# Patient Record
Sex: Female | Born: 1965
Health system: Southern US, Community
[De-identification: ages and names within clinical notes are randomized; demographics above are authoritative.]

## PROBLEM LIST (undated history)

## (undated) DIAGNOSIS — Z9889 Other specified postprocedural states: Secondary | ICD-10-CM

## (undated) DIAGNOSIS — F419 Anxiety disorder, unspecified: Secondary | ICD-10-CM

## (undated) DIAGNOSIS — D120 Benign neoplasm of cecum: Secondary | ICD-10-CM

## (undated) DIAGNOSIS — R1013 Epigastric pain: Secondary | ICD-10-CM

## (undated) DIAGNOSIS — K635 Polyp of colon: Secondary | ICD-10-CM

## (undated) DIAGNOSIS — K317 Polyp of stomach and duodenum: Secondary | ICD-10-CM

## (undated) DIAGNOSIS — F329 Major depressive disorder, single episode, unspecified: Secondary | ICD-10-CM

## (undated) DIAGNOSIS — M7730 Calcaneal spur, unspecified foot: Secondary | ICD-10-CM

## (undated) DIAGNOSIS — E079 Disorder of thyroid, unspecified: Secondary | ICD-10-CM

## (undated) DIAGNOSIS — Z5189 Encounter for other specified aftercare: Secondary | ICD-10-CM

## (undated) DIAGNOSIS — R112 Nausea with vomiting, unspecified: Secondary | ICD-10-CM

## (undated) DIAGNOSIS — J338 Other polyp of sinus: Secondary | ICD-10-CM

## (undated) DIAGNOSIS — E785 Hyperlipidemia, unspecified: Secondary | ICD-10-CM

## (undated) DIAGNOSIS — E063 Autoimmune thyroiditis: Secondary | ICD-10-CM

## (undated) DIAGNOSIS — IMO0002 Reserved for concepts with insufficient information to code with codable children: Secondary | ICD-10-CM

## (undated) DIAGNOSIS — M722 Plantar fascial fibromatosis: Secondary | ICD-10-CM

## (undated) DIAGNOSIS — Z972 Presence of dental prosthetic device (complete) (partial): Secondary | ICD-10-CM

## (undated) DIAGNOSIS — M7661 Achilles tendinitis, right leg: Secondary | ICD-10-CM

## (undated) DIAGNOSIS — J349 Unspecified disorder of nose and nasal sinuses: Secondary | ICD-10-CM

## (undated) DIAGNOSIS — D649 Anemia, unspecified: Secondary | ICD-10-CM

## (undated) DIAGNOSIS — K219 Gastro-esophageal reflux disease without esophagitis: Secondary | ICD-10-CM

## (undated) DIAGNOSIS — M1990 Unspecified osteoarthritis, unspecified site: Secondary | ICD-10-CM

## (undated) DIAGNOSIS — T7840XA Allergy, unspecified, initial encounter: Secondary | ICD-10-CM

## (undated) HISTORY — DX: Epigastric pain: R10.13

## (undated) HISTORY — PX: ABDOMINAL HYSTERECTOMY: SHX81

## (undated) HISTORY — DX: Major depressive disorder, single episode, unspecified: F32.9

## (undated) HISTORY — DX: Plantar fascial fibromatosis: M72.2

## (undated) HISTORY — DX: Disorder of thyroid, unspecified: E07.9

## (undated) HISTORY — DX: Other polyp of sinus: J33.8

## (undated) HISTORY — DX: Polyp of stomach and duodenum: K31.7

## (undated) HISTORY — DX: Unspecified osteoarthritis, unspecified site: M19.90

## (undated) HISTORY — DX: Reserved for concepts with insufficient information to code with codable children: IMO0002

## (undated) HISTORY — DX: Anxiety disorder, unspecified: F41.9

## (undated) HISTORY — DX: Anemia, unspecified: D64.9

## (undated) HISTORY — DX: Hyperlipidemia, unspecified: E78.5

## (undated) HISTORY — PX: OVARIAN CYST REMOVAL: SHX89

## (undated) HISTORY — DX: Polyp of colon: K63.5

## (undated) HISTORY — DX: Benign neoplasm of cecum: D12.0

## (undated) HISTORY — DX: Achilles tendinitis, right leg: M76.61

## (undated) HISTORY — DX: Encounter for other specified aftercare: Z51.89

## (undated) HISTORY — DX: Calcaneal spur, unspecified foot: M77.30

## (undated) HISTORY — DX: Gastro-esophageal reflux disease without esophagitis: K21.9

## (undated) HISTORY — DX: Allergy, unspecified, initial encounter: T78.40XA

## (undated) HISTORY — PX: TOTAL ABDOMINAL HYSTERECTOMY: SHX209

## (undated) HISTORY — DX: Unspecified disorder of nose and nasal sinuses: J34.9

---

## 2013-05-17 DIAGNOSIS — E039 Hypothyroidism, unspecified: Secondary | ICD-10-CM | POA: Insufficient documentation

## 2013-05-17 DIAGNOSIS — M1990 Unspecified osteoarthritis, unspecified site: Secondary | ICD-10-CM | POA: Insufficient documentation

## 2013-05-17 DIAGNOSIS — K219 Gastro-esophageal reflux disease without esophagitis: Secondary | ICD-10-CM | POA: Insufficient documentation

## 2018-06-11 ENCOUNTER — Ambulatory Visit (INDEPENDENT_AMBULATORY_CARE_PROVIDER_SITE_OTHER): Payer: Self-pay | Admitting: Medical

## 2018-06-11 ENCOUNTER — Encounter: Payer: Self-pay | Admitting: Medical

## 2018-06-11 VITALS — BP 122/80 | HR 80 | Temp 98.7°F | Wt 202.0 lb

## 2018-06-11 DIAGNOSIS — R062 Wheezing: Secondary | ICD-10-CM

## 2018-06-11 DIAGNOSIS — R059 Cough, unspecified: Secondary | ICD-10-CM

## 2018-06-11 DIAGNOSIS — F17210 Nicotine dependence, cigarettes, uncomplicated: Secondary | ICD-10-CM

## 2018-06-11 DIAGNOSIS — J40 Bronchitis, not specified as acute or chronic: Secondary | ICD-10-CM

## 2018-06-11 DIAGNOSIS — R05 Cough: Secondary | ICD-10-CM

## 2018-06-11 MED ORDER — AMOXICILLIN-POT CLAVULANATE 875-125 MG PO TABS: 1.0000 | ORAL_TABLET | Freq: Two times a day (BID) | ORAL | 0 refills | Status: DC

## 2018-06-11 MED ORDER — PREDNISONE 10 MG (21) PO TBPK: ORAL_TABLET | ORAL | 0 refills | Status: DC

## 2018-06-11 MED ORDER — ALBUTEROL SULFATE HFA 108 (90 BASE) MCG/ACT IN AERS: 2.0000 | INHALATION_SPRAY | Freq: Four times a day (QID) | RESPIRATORY_TRACT | 0 refills | Status: DC | PRN

## 2018-06-11 MED ORDER — BENZONATATE 100 MG PO CAPS: 100.0000 mg | ORAL_CAPSULE | Freq: Two times a day (BID) | ORAL | 0 refills | Status: DC | PRN

## 2018-06-11 MED ORDER — ALBUTEROL SULFATE (2.5 MG/3ML) 0.083% IN NEBU
2.5000 mg | INHALATION_SOLUTION | Freq: Once | RESPIRATORY_TRACT | Status: AC
  Administered 2018-06-11: 2.5 mg via RESPIRATORY_TRACT

## 2018-06-11 MED ORDER — IPRATROPIUM-ALBUTEROL 0.5-2.5 (3) MG/3ML IN SOLN: 3.0000 mL | Freq: Once | RESPIRATORY_TRACT | Status: DC

## 2018-06-11 NOTE — Patient Instructions (Addendum)
Prednisone tablets What is this medicine? PREDNISONE (PRED ni sone) is a corticosteroid. It is commonly used to treat inflammation of the skin, joints, lungs, and other organs. Common conditions treated include asthma, allergies, and arthritis. It is also used for other conditions, such as blood disorders and diseases of the adrenal glands. This medicine may be used for other purposes; ask your health care provider or pharmacist if you have questions. COMMON BRAND NAME(S): Deltasone, Predone, Sterapred, Sterapred DS What should I tell my health care provider before I take this medicine? They need to know if you have any of these conditions: -Cushing's syndrome -diabetes -glaucoma -heart disease -high blood pressure -infection (especially a virus infection such as chickenpox, cold sores, or herpes) -kidney disease -liver disease -mental illness -myasthenia gravis -osteoporosis -seizures -stomach or intestine problems -thyroid disease -an unusual or allergic reaction to lactose, prednisone, other medicines, foods, dyes, or preservatives -pregnant or trying to get pregnant -breast-feeding How should I use this medicine? Take this medicine by mouth with a glass of water. Follow the directions on the prescription label. Take this medicine with food. If you are taking this medicine once a day, take it in the morning. Do not take more medicine than you are told to take. Do not suddenly stop taking your medicine because you may develop a severe reaction. Your doctor will tell you how much medicine to take. If your doctor wants you to stop the medicine, the dose may be slowly lowered over time to avoid any side effects. Talk to your pediatrician regarding the use of this medicine in children. Special care may be needed. Overdosage: If you think you have taken too much of this medicine contact a poison control center or emergency room at once. NOTE: This medicine is only for you. Do not share this  medicine with others. What if I miss a dose? If you miss a dose, take it as soon as you can. If it is almost time for your next dose, talk to your doctor or health care professional. You may need to miss a dose or take an extra dose. Do not take double or extra doses without advice. What may interact with this medicine? Do not take this medicine with any of the following medications: -metyrapone -mifepristone This medicine may also interact with the following medications: -aminoglutethimide -amphotericin B -aspirin and aspirin-like medicines -barbiturates -certain medicines for diabetes, like glipizide or glyburide -cholestyramine -cholinesterase inhibitors -cyclosporine -digoxin -diuretics -ephedrine -female hormones, like estrogens and birth control pills -isoniazid -ketoconazole -NSAIDS, medicines for pain and inflammation, like ibuprofen or naproxen -phenytoin -rifampin -toxoids -vaccines -warfarin This list may not describe all possible interactions. Give your health care provider a list of all the medicines, herbs, non-prescription drugs, or dietary supplements you use. Also tell them if you smoke, drink alcohol, or use illegal drugs. Some items may interact with your medicine. What should I watch for while using this medicine? Visit your doctor or health care professional for regular checks on your progress. If you are taking this medicine over a prolonged period, carry an identification card with your name and address, the type and dose of your medicine, and your doctor's name and address. This medicine may increase your risk of getting an infection. Tell your doctor or health care professional if you are around anyone with measles or chickenpox, or if you develop sores or blisters that do not heal properly. If you are going to have surgery, tell your doctor or health care professional that  you have taken this medicine within the last twelve months. Ask your doctor or health  care professional about your diet. You may need to lower the amount of salt you eat. This medicine may affect blood sugar levels. If you have diabetes, check with your doctor or health care professional before you change your diet or the dose of your diabetic medicine. What side effects may I notice from receiving this medicine? Side effects that you should report to your doctor or health care professional as soon as possible: -allergic reactions like skin rash, itching or hives, swelling of the face, lips, or tongue -changes in emotions or moods -changes in vision -depressed mood -eye pain -fever or chills, cough, sore throat, pain or difficulty passing urine -increased thirst -swelling of ankles, feet Side effects that usually do not require medical attention (report to your doctor or health care professional if they continue or are bothersome): -confusion, excitement, restlessness -headache -nausea, vomiting -skin problems, acne, thin and shiny skin -trouble sleeping -weight gain This list may not describe all possible side effects. Call your doctor for medical advice about side effects. You may report side effects to FDA at 1-800-FDA-1088. Where should I keep my medicine? Keep out of the reach of children. Store at room temperature between 15 and 30 degrees C (59 and 86 degrees F). Protect from light. Keep container tightly closed. Throw away any unused medicine after the expiration date. NOTE: This sheet is a summary. It may not cover all possible information. If you have questions about this medicine, talk to your doctor, pharmacist, or health care provider.  2018 Elsevier/Gold Standard (2011-05-01 10:57:14) Albuterol inhalation aerosol What is this medicine? ALBUTEROL (al Normajean [place]) is a bronchodilator. It helps open up the airways in your lungs to make it easier to breathe. This medicine is used to treat and to prevent bronchospasm. This medicine may be used for other  purposes; ask your health care provider or pharmacist if you have questions. COMMON BRAND NAME(S): Proair HFA, Proventil, Proventil HFA, Respirol, Ventolin, Ventolin HFA What should I tell my health care provider before I take this medicine? They need to know if you have any of the following conditions: -diabetes -heart disease or irregular heartbeat -high blood pressure -pheochromocytoma -seizures -thyroid disease -an unusual or allergic reaction to albuterol, levalbuterol, sulfites, other medicines, foods, dyes, or preservatives -pregnant or trying to get pregnant -breast-feeding How should I use this medicine? This medicine is for inhalation through the mouth. Follow the directions on your prescription label. Take your medicine at regular intervals. Do not use more often than directed. Make sure that you are using your inhaler correctly. Ask you doctor or health care provider if you have any questions. Talk to your pediatrician regarding the use of this medicine in children. Special care may be needed. Overdosage: If you think you have taken too much of this medicine contact a poison control center or emergency room at once. NOTE: This medicine is only for you. Do not share this medicine with others. What if I miss a dose? If you miss a dose, use it as soon as you can. If it is almost time for your next dose, use only that dose. Do not use double or extra doses. What may interact with this medicine? -anti-infectives like chloroquine and pentamidine -caffeine -cisapride -diuretics -medicines for colds -medicines for depression or for emotional or psychotic conditions -medicines for weight loss including some herbal products -methadone -some antibiotics like clarithromycin, erythromycin, levofloxacin, and  linezolid -some heart medicines -steroid hormones like dexamethasone, cortisone, hydrocortisone -theophylline -thyroid hormones This list may not describe all possible  interactions. Give your health care provider a list of all the medicines, herbs, non-prescription drugs, or dietary supplements you use. Also tell them if you smoke, drink alcohol, or use illegal drugs. Some items may interact with your medicine. What should I watch for while using this medicine? Tell your doctor or health care professional if your symptoms do not improve. Do not use extra albuterol. If your asthma or bronchitis gets worse while you are using this medicine, call your doctor right away. If your mouth gets dry try chewing sugarless gum or sucking hard candy. Drink water as directed. What side effects may I notice from receiving this medicine? Side effects that you should report to your doctor or health care professional as soon as possible: -allergic reactions like skin rash, itching or hives, swelling of the face, lips, or tongue -breathing problems -chest pain -feeling faint or lightheaded, falls -high blood pressure -irregular heartbeat -fever -muscle cramps or weakness -pain, tingling, numbness in the hands or feet -vomiting Side effects that usually do not require medical attention (report to your doctor or health care professional if they continue or are bothersome): -cough -difficulty sleeping -headache -nervousness or trembling -stomach upset -stuffy or runny nose -throat irritation -unusual taste This list may not describe all possible side effects. Call your doctor for medical advice about side effects. You may report side effects to FDA at 1-800-FDA-1088. Where should I keep my medicine? Keep out of the reach of children. Store at room temperature between 15 and 30 degrees C (59 and 86 degrees F). The contents are under pressure and may burst when exposed to heat or flame. Do not freeze. This medicine does not work as well if it is too cold. Throw away any unused medicine after the expiration date. Inhalers need to be thrown away after the labeled number of puffs  have been used or by the expiration date; whichever comes first. Ventolin HFA should be thrown away 12 months after removing from foil pouch. Check the instructions that come with your medicine. NOTE: This sheet is a summary. It may not cover all possible information. If you have questions about this medicine, talk to your doctor, pharmacist, or health care provider.  2018 Elsevier/Gold Standard (2013-03-03 10:57:17) Acute Bronchitis, Adult Acute bronchitis is when air tubes (bronchi) in the lungs suddenly get swollen. The condition can make it hard to breathe. It can also cause these symptoms:  A cough.  Coughing up clear, yellow, or green mucus.  Wheezing.  Chest congestion.  Shortness of breath.  A fever.  Body aches.  Chills.  A sore throat.  Follow these instructions at home: Medicines  Take over-the-counter and prescription medicines only as told by your doctor.  If you were prescribed an antibiotic medicine, take it as told by your doctor. Do not stop taking the antibiotic even if you start to feel better. General instructions  Rest.  Drink enough fluids to keep your pee (urine) clear or pale yellow.  Avoid smoking and secondhand smoke. If you smoke and you need help quitting, ask your doctor. Quitting will help your lungs heal faster.  Use an inhaler, cool mist vaporizer, or humidifier as told by your doctor.  Keep all follow-up visits as told by your doctor. This is important. How is this prevented? To lower your risk of getting this condition again:  Wash your hands often  with soap and water. If you cannot use soap and water, use hand sanitizer.  Avoid contact with people who have cold symptoms.  Try not to touch your hands to your mouth, nose, or eyes.  Make sure to get the flu shot every year.  Contact a doctor if:  Your symptoms do not get better in 2 weeks. Get help right away if:  You cough up blood.  You have chest pain.  You have very bad  shortness of breath.  You become dehydrated.  You faint (pass out) or keep feeling like you are going to pass out.  You keep throwing up (vomiting).  You have a very bad headache.  Your fever or chills gets worse. This information is not intended to replace advice given to you by your health care provider. Make sure you discuss any questions you have with your health care provider. Document Released: 03/03/2008 Document Revised: 04/23/2016 Document Reviewed: 03/05/2016 Elsevier Interactive Patient Education  2018 [employer]. . Albuterol inhalation aerosol What is this medicine? ALBUTEROL (al Normajean [place]) is a bronchodilator. It helps open up the airways in your lungs to make it easier to breathe. This medicine is used to treat and to prevent bronchospasm. This medicine may be used for other purposes; ask your health care provider or pharmacist if you have questions. COMMON BRAND NAME(S): Proair HFA, Proventil, Proventil HFA, Respirol, Ventolin, Ventolin HFA What should I tell my health care provider before I take this medicine? They need to know if you have any of the following conditions: -diabetes -heart disease or irregular heartbeat -high blood pressure -pheochromocytoma -seizures -thyroid disease -an unusual or allergic reaction to albuterol, levalbuterol, sulfites, other medicines, foods, dyes, or preservatives -pregnant or trying to get pregnant -breast-feeding How should I use this medicine? This medicine is for inhalation through the mouth. Follow the directions on your prescription label. Take your medicine at regular intervals. Do not use more often than directed. Make sure that you are using your inhaler correctly. Ask you doctor or health care provider if you have any questions. Talk to your pediatrician regarding the use of this medicine in children. Special care may be needed. Overdosage: If you think you have taken too much of this medicine contact a poison  control center or emergency room at once. NOTE: This medicine is only for you. Do not share this medicine with others. What if I miss a dose? If you miss a dose, use it as soon as you can. If it is almost time for your next dose, use only that dose. Do not use double or extra doses. What may interact with this medicine? -anti-infectives like chloroquine and pentamidine -caffeine -cisapride -diuretics -medicines for colds -medicines for depression or for emotional or psychotic conditions -medicines for weight loss including some herbal products -methadone -some antibiotics like clarithromycin, erythromycin, levofloxacin, and linezolid -some heart medicines -steroid hormones like dexamethasone, cortisone, hydrocortisone -theophylline -thyroid hormones This list may not describe all possible interactions. Give your health care provider a list of all the medicines, herbs, non-prescription drugs, or dietary supplements you use. Also tell them if you smoke, drink alcohol, or use illegal drugs. Some items may interact with your medicine. What should I watch for while using this medicine? Tell your doctor or health care professional if your symptoms do not improve. Do not use extra albuterol. If your asthma or bronchitis gets worse while you are using this medicine, call your doctor right away. If your mouth gets dry  try chewing sugarless gum or sucking hard candy. Drink water as directed. What side effects may I notice from receiving this medicine? Side effects that you should report to your doctor or health care professional as soon as possible: -allergic reactions like skin rash, itching or hives, swelling of the face, lips, or tongue -breathing problems -chest pain -feeling faint or lightheaded, falls -high blood pressure -irregular heartbeat -fever -muscle cramps or weakness -pain, tingling, numbness in the hands or feet -vomiting Side effects that usually do not require medical  attention (report to your doctor or health care professional if they continue or are bothersome): -cough -difficulty sleeping -headache -nervousness or trembling -stomach upset -stuffy or runny nose -throat irritation -unusual taste This list may not describe all possible side effects. Call your doctor for medical advice about side effects. You may report side effects to FDA at 1-800-FDA-1088. Where should I keep my medicine? Keep out of the reach of children. Store at room temperature between 15 and 30 degrees C (59 and 86 degrees F). The contents are under pressure and may burst when exposed to heat or flame. Do not freeze. This medicine does not work as well if it is too cold. Throw away any unused medicine after the expiration date. Inhalers need to be thrown away after the labeled number of puffs have been used or by the expiration date; whichever comes first. Ventolin HFA should be thrown away 12 months after removing from foil pouch. Check the instructions that come with your medicine. NOTE: This sheet is a summary. It may not cover all possible information. If you have questions about this medicine, talk to your doctor, pharmacist, or health care provider.  2018 Elsevier/Gold Standard (2013-03-03 10:57:17) Amoxicillin; Clavulanic Acid tablets What is this medicine? AMOXICILLIN; CLAVULANIC ACID (a mox i SIL in; KLAV yoo lan ic AS id) is a penicillin antibiotic. It is used to treat certain kinds of bacterial infections. It will not work for colds, flu, or other viral infections. This medicine may be used for other purposes; ask your health care provider or pharmacist if you have questions. COMMON BRAND NAME(S): Augmentin What should I tell my health care provider before I take this medicine? They need to know if you have any of these conditions: -bowel disease, like colitis -kidney disease -liver disease -mononucleosis -an unusual or allergic reaction to amoxicillin, penicillin,  cephalosporin, other antibiotics, clavulanic acid, other medicines, foods, dyes, or preservatives -pregnant or trying to get pregnant -breast-feeding How should I use this medicine? Take this medicine by mouth with a full glass of water. Follow the directions on the prescription label. Take at the start of a meal. Do not crush or chew. If the tablet has a score line, you may cut it in half at the score line for easier swallowing. Take your medicine at regular intervals. Do not take your medicine more often than directed. Take all of your medicine as directed even if you think you are better. Do not skip doses or stop your medicine early. Talk to your pediatrician regarding the use of this medicine in children. Special care may be needed. Overdosage: If you think you have taken too much of this medicine contact a poison control center or emergency room at once. NOTE: This medicine is only for you. Do not share this medicine with others. What if I miss a dose? If you miss a dose, take it as soon as you can. If it is almost time for your next dose, take  only that dose. Do not take double or extra doses. What may interact with this medicine? -allopurinol -anticoagulants -birth control pills -methotrexate -probenecid This list may not describe all possible interactions. Give your health care provider a list of all the medicines, herbs, non-prescription drugs, or dietary supplements you use. Also tell them if you smoke, drink alcohol, or use illegal drugs. Some items may interact with your medicine. What should I watch for while using this medicine? Tell your doctor or health care professional if your symptoms do not improve. Do not treat diarrhea with over the counter products. Contact your doctor if you have diarrhea that lasts more than 2 days or if it is severe and watery. If you have diabetes, you may get a false-positive result for sugar in your urine. Check with your doctor or health care  professional. Birth control pills may not work properly while you are taking this medicine. Talk to your doctor about using an extra method of birth control. What side effects may I notice from receiving this medicine? Side effects that you should report to your doctor or health care professional as soon as possible: -allergic reactions like skin rash, itching or hives, swelling of the face, lips, or tongue -breathing problems -dark urine -fever or chills, sore throat -redness, blistering, peeling or loosening of the skin, including inside the mouth -seizures -trouble passing urine or change in the amount of urine -unusual bleeding, bruising -unusually weak or tired -white patches or sores in the mouth or throat Side effects that usually do not require medical attention (report to your doctor or health care professional if they continue or are bothersome): -diarrhea -dizziness -headache -nausea, vomiting -stomach upset -vaginal or anal irritation This list may not describe all possible side effects. Call your doctor for medical advice about side effects. You may report side effects to FDA at 1-800-FDA-1088. Where should I keep my medicine? Keep out of the reach of children. Store at room temperature below 25 degrees C (77 degrees F). Keep container tightly closed. Throw away any unused medicine after the expiration date. NOTE: This sheet is a summary. It may not cover all possible information. If you have questions about this medicine, talk to your doctor, pharmacist, or health care provider.  2018 Elsevier/Gold Standard (2007-12-09 12:04:30)

## 2018-06-11 NOTE — Progress Notes (Signed)
Subjective:    Patient ID: Brandy Ballard, female    DOB: Dec 02, 1965, 52 y.o.   MRN: [id number]  HPI 52 yo female in non acute distress.Cough x 7 days.  Productive cough x 3 day-4 days , yellow /green. Nasal congestion and chest congestion. Taking Zyrtec , Tylenol and Sudafed. Has heard wheezing and rattling. Shortness of breath and some chest discomfort , tightness, coughing makes the chest hurt. Thought she was getting better then worse last night.  No prior history of Bronchitis or Pneumonia.  Smoker: 1ppd/ x 44yrs  Blood pressure 122/80, pulse 80, temperature 98.7 F (37.1 C), weight 202 lb (91.6 kg), SpO2 97 %.  Review of Systems  Constitutional: Positive for appetite change (decreased). Negative for chills and fever.  HENT: Positive for congestion, postnasal drip, sinus pressure and voice change (hoarse). Negative for ear pain, rhinorrhea, sinus pain and sore throat.   Eyes: Positive for discharge (yellow/white) and itching.  Respiratory: Positive for cough, chest tightness, shortness of breath and wheezing.   Cardiovascular: Positive for chest pain.  Gastrointestinal: Negative for abdominal pain.  Musculoskeletal: Negative for myalgias.  Skin: Negative for rash.  Allergic/Immunologic: Positive for environmental allergies. Negative for food allergies.  Neurological: Negative for dizziness, syncope and light-headedness.  Hematological: Negative for adenopathy.       Objective:   Physical Exam  Constitutional: She is oriented to person, place, and time. She appears well-developed and well-nourished.  HENT:  Head: Normocephalic and atraumatic.  Right Ear: Hearing, external ear and ear canal normal. A middle ear effusion is present.  Left Ear: Hearing, external ear and ear canal normal. A middle ear effusion is present.  Mouth/Throat: Uvula is midline, oropharynx is clear and moist and mucous membranes are normal.  Eyes: Pupils are equal, round, and reactive to light. Conjunctivae  and EOM are normal.  Cardiovascular: Normal rate, regular rhythm and normal heart sounds.  Pulmonary/Chest: Effort normal. She has wheezes in the right upper field, the right middle field, the right lower field, the left upper field, the left middle field and the left lower field.  Neurological: She is alert and oriented to person, place, and time.  Skin: Skin is warm and dry.  Psychiatric: She has a normal mood and affect. Her behavior is normal. Judgment and thought content normal.  Nursing note and vitals reviewed.  Coughing in room, able to speak in full sentences and able to take deep breaths   Duoneb treatment s/p 97% RA HR 76 Right side very minimal inspiratory wheeze in base Left side improved wheezing but still wheezing base and mid lungf     Assessment & Plan:  Bronchitis, cough including bronchospasm Wheezing, smoker  Meds ordered this encounter  Medications  . benzonatate (TESSALON) 100 MG capsule    Sig: Take 1 capsule (100 mg total) by mouth 2 (two) times daily as needed for cough (take 1-2 capsules every 8 hours as needed for cough).    Dispense:  30 capsule    Refill:  0  . ipratropium-albuterol (DUONEB) 0.5-2.5 (3) MG/3ML nebulizer solution 3 mL  . predniSONE (STERAPRED UNI-PAK 21 TAB) 10 MG (21) TBPK tablet    Sig: Take 6 tablets by mouth today then  5 tablets tomorrow then one less each day thereafter.    Dispense:  21 tablet    Refill:  0  . albuterol (PROVENTIL HFA;VENTOLIN HFA) 108 (90 Base) MCG/ACT inhaler    Sig: Inhale 2 puffs into the lungs every 6 (six) hours as  needed for wheezing or shortness of breath.    Dispense:  1 Inhaler    Refill:  0  . amoxicillin-clavulanate (AUGMENTIN) 875-125 MG tablet    Sig: Take 1 tablet by mouth 2 (two) times daily.    Dispense:  20 tablet    Refill:  0   Try to  Decrease smoking while sick. Take OTC Tylenol for discomfort. Follow up with an urgent care or your doctor if not improving in 3-5 days . If worsening  shortness of breath or chest pain you should be evaluated further by an urgent care , your doctor or the emergency department and most likely will need a chest x-ray.Reviewed medications with the patient. Given work note for the rest of today, then she is off till Sunday. To Rest and increase fluids. She verbalizes understanding and has no questions at discharge.

## 2018-06-14 ENCOUNTER — Telehealth: Payer: Self-pay | Admitting: Emergency Medicine

## 2018-06-14 NOTE — Telephone Encounter (Signed)
Spoke with patient on last dose of prednisone still has antibiotic, inhaler and tessalon perls stated that she is still coughing but doing much better

## 2018-06-30 ENCOUNTER — Other Ambulatory Visit: Payer: Self-pay

## 2018-06-30 ENCOUNTER — Ambulatory Visit (INDEPENDENT_AMBULATORY_CARE_PROVIDER_SITE_OTHER): Payer: No Typology Code available for payment source | Admitting: Physician Assistant

## 2018-06-30 ENCOUNTER — Encounter: Payer: Self-pay | Admitting: Physician Assistant

## 2018-06-30 VITALS — BP 124/80 | HR 88 | Temp 99.0°F | Ht 65.5 in | Wt 201.6 lb

## 2018-06-30 DIAGNOSIS — Z1211 Encounter for screening for malignant neoplasm of colon: Secondary | ICD-10-CM

## 2018-06-30 DIAGNOSIS — Z1239 Encounter for other screening for malignant neoplasm of breast: Secondary | ICD-10-CM

## 2018-06-30 DIAGNOSIS — Z13220 Encounter for screening for lipoid disorders: Secondary | ICD-10-CM | POA: Diagnosis not present

## 2018-06-30 DIAGNOSIS — Z131 Encounter for screening for diabetes mellitus: Secondary | ICD-10-CM

## 2018-06-30 DIAGNOSIS — Z114 Encounter for screening for human immunodeficiency virus [HIV]: Secondary | ICD-10-CM

## 2018-06-30 DIAGNOSIS — Z716 Tobacco abuse counseling: Secondary | ICD-10-CM

## 2018-06-30 DIAGNOSIS — E063 Autoimmune thyroiditis: Secondary | ICD-10-CM

## 2018-06-30 DIAGNOSIS — Z130 Encounter for screening for diseases of the blood and blood-forming organs and certain disorders involving the immune mechanism: Secondary | ICD-10-CM

## 2018-06-30 MED ORDER — NA SULFATE-K SULFATE-MG SULF 17.5-3.13-1.6 GM/177ML PO SOLN: 1.0000 | Freq: Once | ORAL | 0 refills | Status: AC

## 2018-06-30 MED ORDER — BUPROPION HCL ER (SR) 150 MG PO TB12: ORAL_TABLET | ORAL | 0 refills | Status: DC

## 2018-06-30 NOTE — Progress Notes (Signed)
Patient: Brandy Ballard, Female    DOB: [birth year], 52 y.o.   MRN: [id number] Visit Date: 07/02/2018  Today's Provider: Trinna Post, PA-C   Chief Complaint  Patient presents with  . Establish Care   Subjective:   New Patient:  Brandy Ballard is a 52 year old female who presents today to [place] as a new patient. Has not been followed by a PCP recently.  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 She has been married for two years. She has one child aged 54 from previous relationship.   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 She has not taken synthroid for the past 5 years, last thyroid level checked 3 years ago.   Hystrectomy: Reports she had hysterectomy in 2009 for irregular uterine bleeding requiring transfusions. She does not know if she still has a cervix, has not had a recent PAP smear. She had her gynecologic care at [facility] in [place], [place].  Peptic Ulcer: Has a history of peptic ulcers 2/2 NSAID use.   Colon Cancer Screening: Due today.   She reports upper respiratory symptoms today including coughing, congestion. Husband recently sick.   Wt Readings from Last 3 Encounters:  06/30/18 201 lb 9.6 oz (91.4 kg)  06/11/18 202 lb (91.6 kg)    -----------------------------------------------------------------  Review of Systems  Constitutional: Positive for diaphoresis and fatigue.       Irritability   HENT: Positive for congestion, hearing loss, postnasal drip, rhinorrhea, sinus pressure, sinus pain, sneezing, sore throat, tinnitus and voice change.   Eyes: Positive for discharge, redness and itching.  Respiratory: Positive for cough.   Cardiovascular: Negative.   Gastrointestinal: Positive for abdominal pain.  Endocrine: Positive for cold intolerance and heat intolerance.  Genitourinary: Negative.   Musculoskeletal:  Positive for arthralgias, back pain, myalgias and neck pain.  Skin: Positive for rash.  Allergic/Immunologic: Positive for environmental allergies.  Neurological: Positive for numbness.  Hematological: Negative.   Psychiatric/Behavioral: Positive for sleep disturbance. The patient is nervous/anxious.     Social History      She  reports that she has been smoking cigarettes. She has a 10.00 pack-year smoking history. She has never used smokeless tobacco. She reports that she drank alcohol. She reports that she does not use drugs.       Social History   Socioeconomic History  . Marital status: Married    Spouse name: Not on file  . Number of children: Not on file  . Years of education: Not on file  . Highest education level: Not on file  Occupational History  . Not on file  Social Needs  . Financial resource strain: Not on file  . Food insecurity:    Worry: Not on file    Inability: Not on file  . Transportation needs:    Medical: Not on file    Non-medical: Not on file  Tobacco Use  . Smoking status: Current Every Day Smoker    Packs/day: 0.50    Years: 20.00    Pack years: 10.00    Types: Cigarettes  . Smokeless tobacco: Never Used  Substance and Sexual Activity  . Alcohol use: Not Currently  . Drug use: Never  . Sexual activity: Not on file  Lifestyle  . Physical activity:    Days per week: Not on file  Minutes per session: Not on file  . Stress: Not on file  Relationships  . Social connections:    Talks on phone: Not on file    Gets together: Not on file    Attends religious service: Not on file    Active member of club or organization: Not on file    Attends meetings of clubs or organizations: Not on file    Relationship status: Not on file  Other Topics Concern  . Not on file  Social History Narrative  . Not on file    Past Medical History:  Diagnosis Date  . Allergy   . Anemia   . Anxiety   . Arthritis    lower back  . Blood transfusion without  reported diagnosis   . Dental bridge present    permanent lower retainer  . Depression   . GERD (gastroesophageal reflux disease)   . Hashimoto's disease   . PONV (postoperative nausea and vomiting)    nausea only  . Thyroid disease      There are no active problems to display for this patient.   Past Surgical History:  Procedure Laterality Date  . ABDOMINAL HYSTERECTOMY    . OVARIAN CYST REMOVAL      Family History        Family Status  Relation Name Status  . Mother  Alive  . Father  Alive  . PGM  Deceased  . PGF  Deceased  . Daughter  Alive  . MGM  Deceased  . MGF  Deceased        Her family history includes Atrial fibrillation in her father; Breast cancer in her paternal grandmother; CVA in her father and paternal grandfather; Heart attack in her paternal grandfather; Hypertension in her father; Meniere's disease in her mother; Polycystic ovary syndrome in her daughter.      No Known Allergies   Current Outpatient Medications:  .  acetaminophen (TYLENOL) 500 MG tablet, Take 1,000 mg by mouth every 6 (six) hours as needed., Disp: , Rfl:  .  cetirizine (ZYRTEC) 10 MG tablet, Take 10 mg by mouth daily., Disp: , Rfl:  .  Nutritional Supplements (ESTROVEN PO), Take by mouth., Disp: , Rfl:  .  atorvastatin (LIPITOR) 10 MG tablet, Take 1 tablet (10 mg total) by mouth daily., Disp: 30 tablet, Rfl: 3 .  buPROPion (WELLBUTRIN SR) 150 MG 12 hr tablet, Take one pill daily for three days. On day four, take one pill twice daily onward., Disp: 90 tablet, Rfl: 0 .  omeprazole (PRILOSEC) 40 MG capsule, Take 40 mg by mouth daily., Disp: , Rfl:    Patient Care Team: Paulene Floor as PCP - General (Physician Assistant)      Objective:   Vitals: BP 124/80 (BP Location: Left Arm, Patient Position: Sitting, Cuff Size: Large)   Pulse 88   Temp 99 F (37.2 C) (Oral)   Ht 5' 5.5" (1.664 m)   Wt 201 lb 9.6 oz (91.4 kg)   SpO2 97%   BMI 33.04 kg/m    Vitals:    06/30/18 1009  BP: 124/80  Pulse: 88  Temp: 99 F (37.2 C)  TempSrc: Oral  SpO2: 97%  Weight: 201 lb 9.6 oz (91.4 kg)  Height: 5' 5.5" (1.664 m)     Physical Exam  Constitutional: She is oriented to person, place, and time. She appears well-developed and well-nourished.  HENT:  Right Ear: Tympanic membrane and external ear normal.  Left Ear: Tympanic  membrane and external ear normal.  Nose: Nose normal.  Mouth/Throat: Oropharynx is clear and moist. No oropharyngeal exudate.  Neck: Neck supple.  Cardiovascular: Normal rate and regular rhythm.  Pulmonary/Chest: Effort normal and breath sounds normal.  Abdominal: Soft. Bowel sounds are normal.  Neurological: She is alert and oriented to person, place, and time.  Skin: Skin is warm and dry.  Psychiatric: She has a normal mood and affect. Her behavior is normal.     Depression Screen PHQ 2/9 Scores 06/30/2018  PHQ - 2 Score 0  PHQ- 9 Score 4      Assessment & Plan:     Routine Health Maintenance and Physical Exam  Exercise Activities and Dietary recommendations Goals   None     Immunization History  Administered Date(s) Administered  . Influenza-Unspecified 06/16/2018    Health Maintenance  Topic Date Due  . TETANUS/TDAP  11/28/1986  . PAP SMEAR  11/27/1988  . MAMMOGRAM  11/27/2017  . COLONOSCOPY  11/27/2017  . INFLUENZA VACCINE  Completed  . HIV Screening  Completed     Discussed health benefits of physical activity, and encouraged her to engage in regular exercise appropriate for her age and condition.   1. Colon cancer screening  - Ambulatory referral to Gastroenterology  2. Lipid screening  Cholesterol elevated, LDL >190 will start Lipitor 10 mg.   - Lipid Profile  3. Hashimoto's thyroiditis  - TSH+T4F+T3Free  4. Breast cancer screening  - MM DIAG BREAST TOMO BILATERAL; Future  5. Diabetes mellitus screening  - Comprehensive Metabolic Panel (CMET)  6. Encounter for screening for  HIV  - HIV Antibody (routine testing w rflx)  7. Screening for deficiency anemia  - CBC with Differential  8. Encounter for tobacco use cessation counseling  I have counseled this patient >3 min about smoking cessation and options for quitting.   - buPROPion (WELLBUTRIN SR) 150 MG 12 hr tablet; Take one pill daily for three days. On day four, take one pill twice daily onward.  Dispense: 90 tablet; Refill: 0  Return in about 2 months (around 08/30/2018).  The entirety of the information documented in the History of Present Illness, Review of Systems and Physical Exam were personally obtained by me. Portions of this information were initially documented by Tiburcio Pea, CMA and reviewed by me for thoroughness and accuracy.      --------------------------------------------------------------------    Trinna Post, PA-C  [facility]

## 2018-06-30 NOTE — Patient Instructions (Addendum)
Take omeprazole 40 mg daily x 8 weeks for possible ulcer

## 2018-07-01 ENCOUNTER — Encounter: Payer: Self-pay | Admitting: *Deleted

## 2018-07-01 ENCOUNTER — Other Ambulatory Visit: Payer: Self-pay

## 2018-07-01 LAB — CBC WITH DIFFERENTIAL/PLATELET
Basophils Absolute: 0.1 10*3/uL (ref 0.0–0.2)
Basos: 1 %
EOS (ABSOLUTE): 0.6 10*3/uL — ABNORMAL HIGH (ref 0.0–0.4)
Eos: 5 %
Hematocrit: 41.6 % (ref 34.0–46.6)
Hemoglobin: 14.2 g/dL (ref 11.1–15.9)
Immature Grans (Abs): 0 10*3/uL (ref 0.0–0.1)
Immature Granulocytes: 0 %
Lymphocytes Absolute: 1.6 10*3/uL (ref 0.7–3.1)
Lymphs: 13 %
MCH: 31.4 pg (ref 26.6–33.0)
MCHC: 34.1 g/dL (ref 31.5–35.7)
MCV: 92 fL (ref 79–97)
Monocytes Absolute: 0.7 10*3/uL (ref 0.1–0.9)
Monocytes: 6 %
Neutrophils Absolute: 9.2 10*3/uL — ABNORMAL HIGH (ref 1.4–7.0)
Neutrophils: 75 %
Platelets: 289 10*3/uL (ref 150–450)
RBC: 4.52 x10E6/uL (ref 3.77–5.28)
RDW: 11.9 % — ABNORMAL LOW (ref 12.3–15.4)
WBC: 12.1 10*3/uL — ABNORMAL HIGH (ref 3.4–10.8)

## 2018-07-01 LAB — HIV ANTIBODY (ROUTINE TESTING W REFLEX): HIV Screen 4th Generation wRfx: NONREACTIVE

## 2018-07-01 LAB — LIPID PANEL
Chol/HDL Ratio: 6 ratio — ABNORMAL HIGH (ref 0.0–4.4)
Cholesterol, Total: 281 mg/dL — ABNORMAL HIGH (ref 100–199)
HDL: 47 mg/dL (ref >39)
LDL Calculated: 207 mg/dL — ABNORMAL HIGH (ref 0–99)
Triglycerides: 134 mg/dL (ref 0–149)
VLDL Cholesterol Cal: 27 mg/dL (ref 5–40)

## 2018-07-01 LAB — COMPREHENSIVE METABOLIC PANEL
ALT: 34 IU/L — ABNORMAL HIGH (ref 0–32)
AST: 14 IU/L (ref 0–40)
Albumin/Globulin Ratio: 1.9 (ref 1.2–2.2)
Albumin: 4.4 g/dL (ref 3.5–5.5)
Alkaline Phosphatase: 59 IU/L (ref 39–117)
BUN/Creatinine Ratio: 21 (ref 9–23)
BUN: 12 mg/dL (ref 6–24)
Bilirubin Total: 0.2 mg/dL (ref 0.0–1.2)
CO2: 21 mmol/L (ref 20–29)
Calcium: 9.8 mg/dL (ref 8.7–10.2)
Chloride: 102 mmol/L (ref 96–106)
Creatinine, Ser: 0.56 mg/dL — ABNORMAL LOW (ref 0.57–1.00)
GFR calc Af Amer: 126 mL/min/{1.73_m2} (ref >59)
GFR calc non Af Amer: 109 mL/min/{1.73_m2} (ref >59)
Globulin, Total: 2.3 g/dL (ref 1.5–4.5)
Glucose: 96 mg/dL (ref 65–99)
Potassium: 4.1 mmol/L (ref 3.5–5.2)
Sodium: 140 mmol/L (ref 134–144)
Total Protein: 6.7 g/dL (ref 6.0–8.5)

## 2018-07-01 LAB — TSH+T4F+T3FREE
Free T4: 1.1 ng/dL (ref 0.82–1.77)
T3, Free: 3.1 pg/mL (ref 2.0–4.4)
TSH: 1.97 u[IU]/mL (ref 0.450–4.500)

## 2018-07-02 ENCOUNTER — Other Ambulatory Visit: Payer: Self-pay

## 2018-07-02 MED ORDER — ATORVASTATIN CALCIUM 10 MG PO TABS: 10.0000 mg | ORAL_TABLET | Freq: Every day | ORAL | 3 refills | Status: DC

## 2018-07-02 NOTE — Progress Notes (Signed)
Patient has been advised and Rx has been sent to her pharmacy

## 2018-07-02 NOTE — Progress Notes (Signed)
Lipitor 

## 2018-07-05 ENCOUNTER — Ambulatory Visit (INDEPENDENT_AMBULATORY_CARE_PROVIDER_SITE_OTHER): Payer: Self-pay | Admitting: Physician Assistant

## 2018-07-05 ENCOUNTER — Telehealth: Payer: Self-pay | Admitting: Gastroenterology

## 2018-07-05 ENCOUNTER — Encounter: Payer: Self-pay | Admitting: Physician Assistant

## 2018-07-05 VITALS — BP 140/90 | HR 84 | Temp 98.4°F | Wt 200.0 lb

## 2018-07-05 DIAGNOSIS — J40 Bronchitis, not specified as acute or chronic: Secondary | ICD-10-CM

## 2018-07-05 DIAGNOSIS — H6691 Otitis media, unspecified, right ear: Secondary | ICD-10-CM

## 2018-07-05 MED ORDER — ALBUTEROL SULFATE HFA 108 (90 BASE) MCG/ACT IN AERS: 2.0000 | INHALATION_SPRAY | Freq: Four times a day (QID) | RESPIRATORY_TRACT | 0 refills | Status: DC | PRN

## 2018-07-05 MED ORDER — CEFDINIR 300 MG PO CAPS: 300.0000 mg | ORAL_CAPSULE | Freq: Two times a day (BID) | ORAL | 0 refills | Status: AC

## 2018-07-05 MED ORDER — GUAIFENESIN-DM 100-10 MG/5ML PO SYRP: 5.0000 mL | ORAL_SOLUTION | ORAL | 0 refills | Status: AC | PRN

## 2018-07-05 MED ORDER — PREDNISONE 10 MG (21) PO TBPK: ORAL_TABLET | ORAL | 0 refills | Status: DC

## 2018-07-05 NOTE — Telephone Encounter (Signed)
Pt left vm to reschedule her procedure for 10/9 due to having a cold

## 2018-07-05 NOTE — Patient Instructions (Addendum)
Thank you for choosing [facility] for your health care needs today.  You have been diagnosed with right otitis media (a right ear infection) and bronchitis (a chest cough, may be secondary to the flu).  Take medication as prescribed. Cefdinir 300mg : 1 pill twice a day x 7 days. Use albuterol inhaler 2 puffs every 4 hours, when awake, will help with cough. Take Sterapred pack (prednisone). Take tapering dose once a day. May use prescription cough syrup for chest congestion/cough. Will help with sleeping.  Increase your fluids. Rest. You have been provided with a work note for the time you missed.  If your symptoms do not improve in the next few days, or you develop a fever, follow-up with your family physician or at an urgent care for further evaluation. You may need a chest x-ray at that time.  Acute Bronchitis, Adult  Acute bronchitis is when air tubes (bronchi) in the lungs suddenly get swollen. The condition can make it hard to breathe. It can also cause these symptoms:  A cough.  Coughing up clear, yellow, or green mucus.  Wheezing.  Chest congestion.  Shortness of breath.  A fever.  Body aches.  Chills.  A sore throat.  Follow these instructions at home: Medicines  Take over-the-counter and prescription medicines only as told by your doctor.  If you were prescribed an antibiotic medicine, take it as told by your doctor. Do not stop taking the antibiotic even if you start to feel better. General instructions  Rest.  Drink enough fluids to keep your pee (urine) clear or pale yellow.  Avoid smoking and secondhand smoke. If you smoke and you need help quitting, ask your doctor. Quitting will help your lungs heal faster.  Use an inhaler, cool mist vaporizer, or humidifier as told by your doctor.  Keep all follow-up visits as told by your doctor. This is important. How is this prevented? To lower your risk of getting this condition again:  Wash your hands often  with soap and water. If you cannot use soap and water, use hand sanitizer.  Avoid contact with people who have cold symptoms.  Try not to touch your hands to your mouth, nose, or eyes.  Make sure to get the flu shot every year.  Contact a doctor if:  Your symptoms do not get better in 2 weeks. Get help right away if:  You cough up blood.  You have chest pain.  You have very bad shortness of breath.  You become dehydrated.  You faint (pass out) or keep feeling like you are going to pass out.  You keep throwing up (vomiting).  You have a very bad headache.  Your fever or chills gets worse. This information is not intended to replace advice given to you by your health care provider. Make sure you discuss any questions you have with your health care provider. Document Released: 03/03/2008 Document Revised: 04/23/2016 Document Reviewed: 03/05/2016 Elsevier Interactive Patient Education  [employer].

## 2018-07-05 NOTE — Progress Notes (Signed)
Patient ID: Brandy Ballard DOB: [birth year] AGE: 52 y.o. MRN: [id number]   PCP: Trinna Post, PA-C   Chief Complaint:  Chief Complaint  Patient presents with  . focus-cough/sorethroat     Subjective:    HPI:  Brandy Ballard is a 52 y.o. female presents for evaluation  Chief Complaint  Patient presents with  . focus-cough/sorethroat    52 year old female presents to [place] with one week history of URI symptoms. Began with flu-like symptoms. Fever (tmax 103F x 2 days), chills, body aches, and malaise. Then developed nasal congestion, sinus pressure/congestion, PND, chest congestion, and cough. Patient states she called [facility], version of e-visit, was told most likely symptoms were influenza, however had been ill more than 48 hours, Tamiflu not advised/prescribed. Patient missed work (typically works Friday, Saturday, Sunday). Patient states fever and body aches resolved. However, cough has persisted. Coarse. Productive. Associated wheezing. Associated malaise/fatigue. Patient yesterday developed right ear pain. Has taken over the counter Tylenol, ibuprofen, Sudafed, Mucinex, and used previously prescribed Tessalon Perles.  Patient seen at [facility] on 06/11/2018, just under one month ago, with similar URI/bronchitis symptoms. Prescribed Sterapred Pak, DuoNeb solution, albuterol inhaler, and Augmentin. States symptoms resolved just a few days before this second illness began.  A complete, at least 10 system review of symptoms was performed, pertinent positives and negatives as mentioned in HPI, otherwise negative.  The following portions of the patient's history were reviewed and updated as appropriate: allergies, current medications and past medical history.  There are no active problems to display for this patient.   No Known Allergies  Current Outpatient Medications on File Prior to Visit  Medication Sig Dispense Refill  . acetaminophen (TYLENOL) 500 MG tablet Take 1,000 mg by  mouth every 6 (six) hours as needed.    Marland Kitchen atorvastatin (LIPITOR) 10 MG tablet Take 1 tablet (10 mg total) by mouth daily. 30 tablet 3  . buPROPion (WELLBUTRIN SR) 150 MG 12 hr tablet Take one pill daily for three days. On day four, take one pill twice daily onward. 90 tablet 0  . cetirizine (ZYRTEC) 10 MG tablet Take 10 mg by mouth daily.    Marland Kitchen doxycycline (MONODOX) 100 MG capsule Take by mouth.    Marland Kitchen ketoconazole (NIZORAL) 2 % cream Apply daily to rash in skin folds and around ears as needed    . metroNIDAZOLE (METROCREAM) 0.75 % cream Apply twice daily to rash around mouth as needed until clear    . Nutritional Supplements (ESTROVEN PO) Take by mouth.    Marland Kitchen omeprazole (PRILOSEC) 40 MG capsule Take 40 mg by mouth daily.    Manus Gunning BOWEL PREP KIT 17.5-3.13-1.6 GM/177ML SOLN   0   No current facility-administered medications on file prior to visit.        Objective:    Vitals:   07/05/18 1032  BP: 140/90  Pulse: 84  Temp: 98.4 F (36.9 C)  SpO2: 97%     Wt Readings from Last 3 Encounters:  07/05/18 200 lb (90.7 kg)  06/30/18 201 lb 9.6 oz (91.4 kg)  06/11/18 202 lb (91.6 kg)    Physical Exam:   General Appearance:  Alert, cooperative, no distress, appears stated age. Afebrile.  Head:  Normocephalic, without obvious abnormality, atraumatic  Eyes:  PERRL, conjunctiva/corneas clear, EOM's intact, fundi benign, both eyes  Ears:  Normal TM's and external ear canals, both ears  Nose: Nares reveal mild edema with nasally sounding voice, septum midline, mucosa normal, no drainage or  sinus tenderness. Subjective bilateral maxillary sinus pressure.  Throat: Lips, mucosa, and tongue normal; teeth and gums normal  Neck: Supple, symmetrical, trachea midline, no adenopathy (though tenderness with palpation along anterior and posterior cervical lymphnode chains);  thyroid: not enlarged, symmetric, no tenderness/mass/nodules; no carotid bruit or JVD  Back:   Symmetric, no curvature, ROM  normal, no CVA tenderness  Lungs:   Clear to auscultation bilaterally, respirations unlabored. Harsh/coarse cough elicited with deep inspiration. No wheezing. No rales, rhonchi, or crackles.  Heart:  Regular rate and rhythm, S1 and S2 normal, no murmur, rub, or gallop  Abdomen:   Soft, non-tender, bowel sounds active all four quadrants,  no masses, no organomegaly  Extremities: Extremities normal, atraumatic, no cyanosis or edema  Pulses: 2+ and symmetric  Skin: Skin color, texture, turgor normal, no rashes or lesions  Lymph nodes: Cervical, supraclavicular, and axillary nodes normal  Neurologic: Normal    Assessment & Plan:    Exam findings, diagnosis etiology and medication use and indications reviewed with patient. Follow-Up and discharge instructions provided. No emergent/urgent issues found on exam.  Patient education was provided.   Patient verbalized understanding of information provided and agrees with plan of care (POC), all questions answered. The patient is advised to call or return to clinic if condition does not see an improvement in symptoms, or to seek the care of the closest emergency department if condition worsens with the above plan.   1. Right otitis media, unspecified otitis media type  - cefdinir (OMNICEF) 300 MG capsule; Take 1 capsule (300 mg total) by mouth 2 (two) times daily for 7 days.  Dispense: 14 capsule; Refill: 0  2. Bronchitis  - predniSONE (STERAPRED UNI-PAK 21 TAB) 10 MG (21) TBPK tablet; Take as directed on packaging.  Dispense: 21 tablet; Refill: 0 - albuterol (PROVENTIL HFA;VENTOLIN HFA) 108 (90 Base) MCG/ACT inhaler; Inhale 2 puffs into the lungs every 6 (six) hours as needed for wheezing or shortness of breath.  Dispense: 1 Inhaler; Refill: 0 - guaiFENesin-dextromethorphan (ROBITUSSIN DM) 100-10 MG/5ML syrup; Take 5 mLs by mouth every 4 (four) hours as needed for up to 7 days for cough.  Dispense: 118 mL; Refill: 0  Patient with one week of  illness. Began with flu-like symptoms; possible patient had influenza. Then triggered bronchitis. Patient also with secondary right otitis media. Prescribed Omnicef for otitis media. Prescribed Sterpred Pak, albuterol inhaler, and cough syrup for bronchitis.  Patient provided work note for missed days.  Advised patient to increase fluids and rest. If symptoms not improving in a few days, patient advised to follow-up with PCP or urgent care, given back to back illness, feel a CXR would be warranted at this time. Patient agreed with plan.  Montey Hora, MHS, PA-C Advanced Practice Provider [facility]  [address] [facility], [place], [place] [address] (p):  [phone] [email or web address]

## 2018-07-06 NOTE — Discharge Instructions (Signed)
General Anesthesia, Adult, Care After °These instructions provide you with information about caring for yourself after your procedure. Your health care provider may also give you more specific instructions. Your treatment has been planned according to current medical practices, but problems sometimes occur. Call your health care provider if you have any problems or questions after your procedure. °What can I expect after the procedure? °After the procedure, it is common to have: °· Vomiting. °· A sore throat. °· Mental slowness. ° °It is common to feel: °· Nauseous. °· Cold or shivery. °· Sleepy. °· Tired. °· Sore or achy, even in parts of your body where you did not have surgery. ° °Follow these instructions at home: °For at least 24 hours after the procedure: °· Do not: °? Participate in activities where you could fall or become injured. °? Drive. °? Use heavy machinery. °? Drink alcohol. °? Take sleeping pills or medicines that cause drowsiness. °? Make important decisions or sign legal documents. °? Take care of children on your own. °· Rest. °Eating and drinking °· If you vomit, drink water, juice, or soup when you can drink without vomiting. °· Drink enough fluid to keep your urine clear or pale yellow. °· Make sure you have little or no nausea before eating solid foods. °· Follow the diet recommended by your health care provider. °General instructions °· Have a responsible adult stay with you until you are awake and alert. °· Return to your normal activities as told by your health care provider. Ask your health care provider what activities are safe for you. °· Take over-the-counter and prescription medicines only as told by your health care provider. °· If you smoke, do not smoke without supervision. °· Keep all follow-up visits as told by your health care provider. This is important. °Contact a health care provider if: °· You continue to have nausea or vomiting at home, and medicines are not helpful. °· You  cannot drink fluids or start eating again. °· You cannot urinate after 8-12 hours. °· You develop a skin rash. °· You have fever. °· You have increasing redness at the site of your procedure. °Get help right away if: °· You have difficulty breathing. °· You have chest pain. °· You have unexpected bleeding. °· You feel that you are having a life-threatening or urgent problem. °This information is not intended to replace advice given to you by your health care provider. Make sure you discuss any questions you have with your health care provider. °Document Released: 12/22/2000 Document Revised: 02/18/2016 Document Reviewed: 08/30/2015 °Elsevier Interactive Patient Education © 2018 [employer] ° °

## 2018-07-06 NOTE — Telephone Encounter (Signed)
Patient contacted office yesterday to reschedule her colonoscopy scheduled for tomorrow with Dr. Marius Ditch.  Patient has been rescheduled to 07/19/18 with Dr. Lucilla Lame instead of Dr. Marius Ditch at [facility].  Referral has been updated.  Kim at [facility] has been made aware of date change.  Thanks [employer]

## 2018-07-08 ENCOUNTER — Telehealth: Payer: Self-pay | Admitting: Emergency Medicine

## 2018-07-08 NOTE — Telephone Encounter (Signed)
Spoke with patient follow up call with [facility] visit per patient doing much better.

## 2018-07-10 ENCOUNTER — Emergency Department: Payer: No Typology Code available for payment source

## 2018-07-10 ENCOUNTER — Emergency Department
Admission: EM | Admit: 2018-07-10 | Discharge: 2018-07-10 | Disposition: A | Discharge status: Home / Self Care | Payer: No Typology Code available for payment source | Attending: Emergency Medicine | Admitting: Emergency Medicine

## 2018-07-10 ENCOUNTER — Other Ambulatory Visit: Payer: Self-pay

## 2018-07-10 ENCOUNTER — Encounter: Payer: Self-pay | Admitting: Emergency Medicine

## 2018-07-10 DIAGNOSIS — F17210 Nicotine dependence, cigarettes, uncomplicated: Secondary | ICD-10-CM | POA: Insufficient documentation

## 2018-07-10 DIAGNOSIS — Z79899 Other long term (current) drug therapy: Secondary | ICD-10-CM | POA: Insufficient documentation

## 2018-07-10 DIAGNOSIS — H57051 Tonic pupil, right eye: Secondary | ICD-10-CM | POA: Diagnosis present

## 2018-07-10 DIAGNOSIS — H5702 Anisocoria: Secondary | ICD-10-CM | POA: Diagnosis not present

## 2018-07-10 DIAGNOSIS — H538 Other visual disturbances: Secondary | ICD-10-CM | POA: Diagnosis not present

## 2018-07-10 LAB — CBC WITH DIFFERENTIAL/PLATELET
ABS IMMATURE GRANULOCYTES: 0.14 10*3/uL — AB (ref 0.00–0.07)
BASOS PCT: 0 %
Basophils Absolute: 0.1 10*3/uL (ref 0.0–0.1)
Eosinophils Absolute: 0.1 10*3/uL (ref 0.0–0.5)
Eosinophils Relative: 1 %
HCT: 39.5 % (ref 36.0–46.0)
Hemoglobin: 13.2 g/dL (ref 12.0–15.0)
IMMATURE GRANULOCYTES: 1 %
Lymphocytes Relative: 12 %
Lymphs Abs: 1.9 10*3/uL (ref 0.7–4.0)
MCH: 31.6 pg (ref 26.0–34.0)
MCHC: 33.4 g/dL (ref 30.0–36.0)
MCV: 94.5 fL (ref 80.0–100.0)
Monocytes Absolute: 0.7 10*3/uL (ref 0.1–1.0)
Monocytes Relative: 5 %
NEUTROS ABS: 12.6 10*3/uL — AB (ref 1.7–7.7)
NEUTROS PCT: 81 %
PLATELETS: 412 10*3/uL — AB (ref 150–400)
RBC: 4.18 MIL/uL (ref 3.87–5.11)
RDW: 12.1 % (ref 11.5–15.5)
WBC: 15.6 10*3/uL — AB (ref 4.0–10.5)
nRBC: 0 % (ref 0.0–0.2)

## 2018-07-10 LAB — COMPREHENSIVE METABOLIC PANEL
ALT: 28 U/L (ref 0–44)
AST: 22 U/L (ref 15–41)
Albumin: 4 g/dL (ref 3.5–5.0)
Alkaline Phosphatase: 58 U/L (ref 38–126)
Anion gap: 8 (ref 5–15)
BUN: 17 mg/dL (ref 6–20)
CHLORIDE: 106 mmol/L (ref 98–111)
CO2: 26 mmol/L (ref 22–32)
CREATININE: 0.67 mg/dL (ref 0.44–1.00)
Calcium: 9.2 mg/dL (ref 8.9–10.3)
GFR calc non Af Amer: >60 mL/min (ref >60)
Glucose, Bld: 120 mg/dL — ABNORMAL HIGH (ref 70–99)
Potassium: 4.1 mmol/L (ref 3.5–5.1)
SODIUM: 140 mmol/L (ref 135–145)
Total Bilirubin: 0.4 mg/dL (ref 0.3–1.2)
Total Protein: 7.7 g/dL (ref 6.5–8.1)

## 2018-07-10 MED ORDER — IOHEXOL 350 MG/ML SOLN
75.0000 mL | Freq: Once | INTRAVENOUS | Status: AC | PRN
  Administered 2018-07-10: 75 mL via INTRAVENOUS

## 2018-07-10 MED ORDER — TETRACAINE HCL 0.5 % OP SOLN
OPHTHALMIC | Status: AC
  Administered 2018-07-10: 1 [drp] via OPHTHALMIC
  Filled 2018-07-10: qty 4

## 2018-07-10 MED ORDER — TETRACAINE HCL 0.5 % OP SOLN
1.0000 [drp] | Freq: Once | OPHTHALMIC | Status: AC
  Administered 2018-07-10: 1 [drp] via OPHTHALMIC

## 2018-07-10 MED ORDER — SODIUM CHLORIDE 0.9 % IV BOLUS
1000.0000 mL | Freq: Once | INTRAVENOUS | Status: AC
  Administered 2018-07-10: 1000 mL via INTRAVENOUS

## 2018-07-10 NOTE — ED Notes (Signed)
NAD noted at time of D/C. Pt denies questions or concerns. Pt ambulatory to the lobby at this time.  

## 2018-07-10 NOTE — ED Notes (Signed)
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 Pt states was at work with sudden onset blurry vision and "happened to look in the mirror". Pt states noted that her R pupil was dilated. No neurological deficits noted at this time. Pt reports "coughing so hard [she] urinated on [herself] earlier today".

## 2018-07-10 NOTE — ED Notes (Signed)
Pt escorted to CT by this RN and Gershon Mussel, Therapist, sports. Pt remains alert and oriented. IStat Creatinine performed by CT staff.

## 2018-07-10 NOTE — ED Triage Notes (Signed)
R pupil dilated x 2 hours. States was here working, noted change in vision and when looked in mirror saw R pupil was dialted. Denies headache, injury or eye pain. Has been sick with cough type symptoms.

## 2018-07-10 NOTE — ED Notes (Signed)
Pt returned from CT at this time. Escorted by this RN and Gershon Mussel, Therapist, sports.

## 2018-07-10 NOTE — ED Provider Notes (Signed)
[facility] Emergency Department Provider Note  ___________________________________________   First MD Initiated Contact with Patient 07/10/18 1610     (approximate)  I have reviewed the triage vital signs and the nursing notes.   HISTORY  Chief Complaint R pupil dilated   HPI Brandy Ballard is a 52 y.o. female with a history of anxiety, arthritis as well as recent acute sinusitis on Ceftin ear as well as decongestants was presented to emergency department today with a dilated right pupil.  The patient says that her vision started to become blurry several hours ago.  She denies any pain to the right eye but says it has been "itchy."  Denies any scratching or scraping prior to the change in vision of the right eye.  Noticed that she had a dilated right-sided pupil.  Was sent to the emergency department for further evaluation.  Denies any headache.  Denies any aneurysm in her family.  Denies any weakness or numbness otherwise.     Past Medical History:  Diagnosis Date  . Allergy   . Anemia   . Anxiety   . Arthritis    lower back  . Blood transfusion without reported diagnosis   . Dental bridge present    permanent lower retainer  . Depression   . GERD (gastroesophageal reflux disease)   . Hashimoto's disease   . PONV (postoperative nausea and vomiting)    nausea only  . Thyroid disease     There are no active problems to display for this patient.   Past Surgical History:  Procedure Laterality Date  . ABDOMINAL HYSTERECTOMY    . OVARIAN CYST REMOVAL      Prior to Admission medications   Medication Sig Start Date End Date Taking? Authorizing Provider  acetaminophen (TYLENOL) 500 MG tablet Take 1,000 mg by mouth every 6 (six) hours as needed.    [provider]  albuterol (PROVENTIL HFA;VENTOLIN HFA) 108 (90 Base) MCG/ACT inhaler Inhale 2 puffs into the lungs every 6 (six) hours as needed for wheezing or shortness of breath. 07/05/18    Darlin Priestly, PA-C  atorvastatin (LIPITOR) 10 MG tablet Take 1 tablet (10 mg total) by mouth daily. 07/02/18   Trinna Post, PA-C  buPROPion (WELLBUTRIN SR) 150 MG 12 hr tablet Take one pill daily for three days. On day four, take one pill twice daily onward. 06/30/18   Trinna Post, PA-C  cefdinir (OMNICEF) 300 MG capsule Take 1 capsule (300 mg total) by mouth 2 (two) times daily for 7 days. 07/05/18 07/12/18  Darlin Priestly, PA-C  cetirizine (ZYRTEC) 10 MG tablet Take 10 mg by mouth daily.    [provider]  doxycycline (MONODOX) 100 MG capsule Take by mouth. 09/14/17   [provider]  guaiFENesin-dextromethorphan (ROBITUSSIN DM) 100-10 MG/5ML syrup Take 5 mLs by mouth every 4 (four) hours as needed for up to 7 days for cough. 07/05/18 07/12/18  Darlin Priestly, PA-C  ketoconazole (NIZORAL) 2 % cream Apply daily to rash in skin folds and around ears as needed 09/01/17   [provider]  metroNIDAZOLE (METROCREAM) 0.75 % cream Apply twice daily to rash around mouth as needed until clear 09/01/17   [provider]  Nutritional Supplements (ESTROVEN PO) Take by mouth.    [provider]  omeprazole (PRILOSEC) 40 MG capsule Take 40 mg by mouth daily.    [provider]  predniSONE (STERAPRED UNI-PAK 21 TAB) 10 MG (21) TBPK tablet Take as directed  on packaging. 07/05/18   Darlin Priestly, PA-C  SUPREP BOWEL PREP KIT 17.5-3.13-1.6 GM/177ML SOLN  06/30/18   [provider]    Allergies Patient has no known allergies.  Family History  Problem Relation Age of Onset  . Meniere's disease Mother   . Hypertension Father   . Atrial fibrillation Father   . CVA Father   . Breast cancer Paternal Grandmother   . Heart attack Paternal Grandfather   . CVA Paternal Grandfather   . Polycystic ovary syndrome Daughter     Social History Social History   Tobacco Use  . Smoking status: Current Every Day Smoker    Packs/day: 0.50     Years: 20.00    Pack years: 10.00    Types: Cigarettes  . Smokeless tobacco: Never Used  Substance Use Topics  . Alcohol use: Not Currently  . Drug use: Never    Review of Systems  Constitutional: No fever/chills Eyes: As above ENT: No sore throat. Cardiovascular: Denies chest pain. Respiratory: Denies shortness of breath. Gastrointestinal: No abdominal pain.  No nausea, no vomiting.  No diarrhea.  No constipation. Genitourinary: Negative for dysuria. Musculoskeletal: Negative for back pain. Skin: Negative for rash. Neurological: Negative for headaches, focal weakness or numbness.   ____________________________________________   PHYSICAL EXAM:  VITAL SIGNS: ED Triage Vitals  Enc Vitals Group     BP 07/10/18 1603 (!) 161/90     Pulse Rate 07/10/18 1603 87     Resp 07/10/18 1603 20     Temp 07/10/18 1603 98 F (36.7 C)     Temp Source 07/10/18 1603 Oral     SpO2 07/10/18 1603 95 %     Weight 07/10/18 1605 200 lb (90.7 kg)     Height 07/10/18 1605 5' 5.5" (1.664 m)     Head Circumference --      Peak Flow --      Pain Score 07/10/18 1605 5     Pain Loc --      Pain Edu? --      Excl. in [place]? --     Constitutional: Alert and oriented. Well appearing and in no acute distress. Eyes: Slightly erythematous right-sided conjunctivo-.  Dilated right pupil which is fixed and nonreactive to light, bilaterally.  However, the left pupil does react to light when shown directly to the left side.  EOMI.  Difficult to calibrate the Tono-Pen and obtained pressures of 30 mmHg bilaterally.  No proptosis.  No tenderness to palpation over the eyes bilaterally. Head: Atraumatic. Nose: No congestion/rhinnorhea. Mouth/Throat: Mucous membranes are moist.  Neck: No stridor.   Cardiovascular: Normal rate, regular rhythm. Grossly normal heart sounds.   Respiratory: Normal respiratory effort.  No retractions. Lungs CTAB. Gastrointestinal: Soft and nontender. No distention.  Musculoskeletal:  No lower extremity tenderness nor edema.  No joint effusions. Neurologic:  Normal speech and language. No gross focal neurologic deficits are appreciated. Skin:  Skin is warm, dry and intact. No rash noted. Psychiatric: Mood and affect are normal. Speech and behavior are normal.  ____________________________________________   LABS (all labs ordered are listed, but only abnormal results are displayed)  Labs Reviewed  CBC WITH DIFFERENTIAL/PLATELET - Abnormal; Notable for the following components:      Result Value   WBC 15.6 (*)    Platelets 412 (*)    Neutro Abs 12.6 (*)    Abs Immature Granulocytes 0.14 (*)    All other components within normal limits  COMPREHENSIVE METABOLIC PANEL - Abnormal;  Notable for the following components:   Glucose, Bld 120 (*)    All other components within normal limits   ____________________________________________  EKG   ____________________________________________  RADIOLOGY  Normal CT of the head neck  ____________________________________________   PROCEDURES  Procedure(s) performed:   Procedures  Critical Care performed:   ____________________________________________   INITIAL IMPRESSION / ASSESSMENT AND PLAN / ED COURSE  Pertinent labs & imaging results that were available during my care of the patient were reviewed by me and considered in my medical decision making (see chart for details).  DDX: Reaction to decongestants, expanding PCA aneurysm, intercranial mass, intercranial hemorrhage, CVA, Horner syndrome As part of my medical decision making, I reviewed the following data within the [place] Notes from prior outpatient visits  ----------------------------------------- 6:04 PM on 07/10/2018 -----------------------------------------  Discussed case with Dr. Tanda Rockers of ophthalmology who agrees with CTA.  However, says that this is likely secondary to medication use and does not seem like an emergency as  long as the CT angiography is reassuring.  I explained this to the patient as well.  Patient to stop decongestant usage.  She will continue with her Ceftin ear and will instead use Benadryl.  We will follow-up with ophthalmology.  Patient now with right-sided pupil that is reacting to light although it is still dilated to about 4 to 5 mm.  Was more 6 to 8 mm previously.  Will be discharged at this time. ____________________________________________   FINAL CLINICAL IMPRESSION(S) / ED DIAGNOSES  [place]  NEW MEDICATIONS STARTED DURING THIS VISIT:  New Prescriptions   No medications on file     Note:  This document was prepared using Dragon voice recognition software and may include unintentional dictation errors.     Orbie Pyo, MD 07/10/18 [phone]

## 2018-07-10 NOTE — ED Notes (Signed)
EDP at bedside at this time. TonoPen placed at bedside by this RN.

## 2018-07-12 ENCOUNTER — Telehealth: Payer: Self-pay | Admitting: Physician Assistant

## 2018-07-12 DIAGNOSIS — H5704 Mydriasis: Secondary | ICD-10-CM

## 2018-07-12 LAB — POCT I-STAT CREATININE: Creatinine, Ser: 0.5 mg/dL (ref 0.44–1.00)

## 2018-07-12 NOTE — Telephone Encounter (Signed)
Pt called back and stated her appt has been scheduled for 07/13/18 @ 945 and she is needing the referral sent over before for insurance purposes.  Pt requesting call back once it has been sent over.

## 2018-07-12 NOTE — Telephone Encounter (Signed)
Pt went to ER Saturday for rt pupil was dilated and fixed.  They referred her to [facility].  She will need a referral and appt scheduled.  Pt's CB# is [phone]  Thanks [employer]

## 2018-07-12 NOTE — Telephone Encounter (Signed)
Referral has been sent.

## 2018-07-13 ENCOUNTER — Ambulatory Visit (INDEPENDENT_AMBULATORY_CARE_PROVIDER_SITE_OTHER): Payer: No Typology Code available for payment source | Admitting: Physician Assistant

## 2018-07-13 ENCOUNTER — Encounter: Payer: Self-pay | Admitting: Physician Assistant

## 2018-07-13 VITALS — BP 130/80 | HR 85 | Temp 98.5°F | Resp 16 | Wt 204.0 lb

## 2018-07-13 DIAGNOSIS — R05 Cough: Secondary | ICD-10-CM

## 2018-07-13 DIAGNOSIS — Z720 Tobacco use: Secondary | ICD-10-CM | POA: Diagnosis not present

## 2018-07-13 DIAGNOSIS — R059 Cough, unspecified: Secondary | ICD-10-CM

## 2018-07-13 DIAGNOSIS — J019 Acute sinusitis, unspecified: Secondary | ICD-10-CM

## 2018-07-13 MED ORDER — AMOXICILLIN-POT CLAVULANATE 875-125 MG PO TABS: 1.0000 | ORAL_TABLET | Freq: Two times a day (BID) | ORAL | 0 refills | Status: AC

## 2018-07-13 MED ORDER — BENZONATATE 100 MG PO CAPS: 100.0000 mg | ORAL_CAPSULE | Freq: Three times a day (TID) | ORAL | 0 refills | Status: AC | PRN

## 2018-07-13 MED ORDER — VARENICLINE TARTRATE 0.5 MG X 11 & 1 MG X 42 PO MISC: ORAL | 0 refills | Status: DC

## 2018-07-13 NOTE — Progress Notes (Signed)
Patient: Brandy Ballard Female    DOB: 1965/11/16   52 y.o.   MRN: [id number] Visit Date: 07/13/2018  Today's Provider: Trinna Post, PA-C   Chief Complaint  Patient presents with  . Cough   Subjective:    HPI Patient here today c/o persistent cough, sore throat x's 2 weeks. Patient reports [facility] went to [facility] and was prescribed cefdinir and prednisone. Patient reports she has been using albuterol inhaler every 6 hours since last Monday, reports no help. She reports that she had a dry hacking cough and also persistent sinus congestion. She reports she had sinus issues for a while but never addressed them.  Most recently, she was seen in the ER for a dilated pupil thought to be secondary to medication side effects. She was advised to stop her cough medication and stop decongestants. Pupil returned back to normal and she was seen by ophthalmology who reported she has increased pressure in her right eye that they are observing for now.  She also reports wellbutrin gave her intolerable dry mouth so she stopped taking this medication and reports she is still smoking.  Wellbutrin had intolerable dry mouth       No Known Allergies   Current Outpatient Medications:  .  acetaminophen (TYLENOL) 500 MG tablet, Take 1,000 mg by mouth every 6 (six) hours as needed., Disp: , Rfl:  .  albuterol (PROVENTIL HFA;VENTOLIN HFA) 108 (90 Base) MCG/ACT inhaler, Inhale 2 puffs into the lungs every 6 (six) hours as needed for wheezing or shortness of breath., Disp: 1 Inhaler, Rfl: 0 .  Nutritional Supplements (ESTROVEN PO), Take by mouth., Disp: , Rfl:  .  omeprazole (PRILOSEC) 40 MG capsule, Take 40 mg by mouth daily., Disp: , Rfl:  .  amoxicillin-clavulanate (AUGMENTIN) 875-125 MG tablet, Take 1 tablet by mouth 2 (two) times daily for 10 days., Disp: 20 tablet, Rfl: 0 .  atorvastatin (LIPITOR) 10 MG tablet, Take 1 tablet (10 mg total) by mouth daily. (Patient not taking: Reported on 07/13/2018),  Disp: 30 tablet, Rfl: 3 .  benzonatate (TESSALON PERLES) 100 MG capsule, Take 1 capsule (100 mg total) by mouth 3 (three) times daily as needed for up to 7 days., Disp: 21 capsule, Rfl: 0 .  cetirizine (ZYRTEC) 10 MG tablet, Take 10 mg by mouth daily., Disp: , Rfl:  .  doxycycline (MONODOX) 100 MG capsule, Take by mouth., Disp: , Rfl:  .  ketoconazole (NIZORAL) 2 % cream, Apply daily to rash in skin folds and around ears as needed, Disp: , Rfl:  .  metroNIDAZOLE (METROCREAM) 0.75 % cream, Apply twice daily to rash around mouth as needed until clear, Disp: , Rfl:  .  predniSONE (STERAPRED UNI-PAK 21 TAB) 10 MG (21) TBPK tablet, Take as directed on packaging. (Patient not taking: Reported on 07/13/2018), Disp: 21 tablet, Rfl: 0 .  varenicline (CHANTIX STARTING MONTH PAK) 0.5 MG X 11 & 1 MG X 42 tablet, One 0.5 mg tablet by mouth once daily for 3 days, then increase to one 0.5 mg tablet 2x daily for 4 days, then one 1 mg tablet twice daily., Disp: 53 tablet, Rfl: 0  Review of Systems  Constitutional: Negative.   HENT: Positive for postnasal drip and sore throat.   Respiratory: Positive for cough.     Social History   Tobacco Use  . Smoking status: Current Every Day Smoker    Packs/day: 0.50    Years: 20.00    Pack  years: 10.00    Types: Cigarettes  . Smokeless tobacco: Never Used  Substance Use Topics  . Alcohol use: Not Currently   Objective:   BP 130/80 (BP Location: Left Arm, Patient Position: Sitting, Cuff Size: Large)   Pulse 85   Temp 98.5 F (36.9 C) (Oral)   Resp 16   Wt 204 lb (92.5 kg)   SpO2 97%   BMI 33.43 kg/m  Vitals:   07/13/18 1146  BP: 130/80  Pulse: 85  Resp: 16  Temp: 98.5 F (36.9 C)  TempSrc: Oral  SpO2: 97%  Weight: 204 lb (92.5 kg)     Physical Exam  Constitutional: She is oriented to person, place, and time. She appears well-developed and well-nourished.  HENT:  Right Ear: External ear normal. Tympanic membrane is not erythematous.  Left Ear:  External ear normal. Tympanic membrane is erythematous.  Nose: Right sinus exhibits frontal sinus tenderness. Right sinus exhibits no maxillary sinus tenderness. Left sinus exhibits frontal sinus tenderness. Left sinus exhibits no maxillary sinus tenderness.  Mouth/Throat: Oropharynx is clear and moist. No oropharyngeal exudate.  Cardiovascular: Normal rate and regular rhythm.  Pulmonary/Chest: Effort normal and breath sounds normal.  Neurological: She is alert and oriented to person, place, and time.  Skin: Skin is warm and dry.  Psychiatric: She has a normal mood and affect. Her behavior is normal.        Assessment & Plan:     1. Acute non-recurrent sinusitis, unspecified location  She wants to be referred to ENT for sinus issues. Counseled her to take daily 2nd gen antihistamine and fonase. Needs to stop smoking.  - amoxicillin-clavulanate (AUGMENTIN) 875-125 MG tablet; Take 1 tablet by mouth 2 (two) times daily for 10 days.  Dispense: 20 tablet; Refill: 0 - Ambulatory referral to ENT  2. Tobacco abuse  I have counseled >3 min about the importance of quitting smoking and methods to do so. Agree upon chantix due to failure of wellbutrin.   - varenicline (CHANTIX STARTING MONTH PAK) 0.5 MG X 11 & 1 MG X 42 tablet; One 0.5 mg tablet by mouth once daily for 3 days, then increase to one 0.5 mg tablet 2x daily for 4 days, then one 1 mg tablet twice daily.  Dispense: 53 tablet; Refill: 0  3. Cough  - benzonatate (TESSALON PERLES) 100 MG capsule; Take 1 capsule (100 mg total) by mouth 3 (three) times daily as needed for up to 7 days.  Dispense: 21 capsule; Refill: 0  Return if symptoms worsen or fail to improve.  The entirety of the information documented in the History of Present Illness, Review of Systems and Physical Exam were personally obtained by me. Portions of this information were initially documented by Lynford Humphrey, CMA and reviewed by me for thoroughness and accuracy.   I  have spent 25 minutes with this patient, >50% of which was spent on counseling and coordination of care.        Trinna Post, PA-C  [facility]

## 2018-07-13 NOTE — Patient Instructions (Signed)

## 2018-07-19 ENCOUNTER — Ambulatory Visit
Admission: RE | Disposition: A | Discharge status: Home / Self Care | Payer: Self-pay | Source: Ambulatory Visit | Attending: Gastroenterology

## 2018-07-19 ENCOUNTER — Ambulatory Visit
Admission: RE | Admit: 2018-07-19 | Discharge: 2018-07-19 | Disposition: A | Discharge status: Home / Self Care | Payer: No Typology Code available for payment source | Source: Ambulatory Visit | Attending: Gastroenterology | Admitting: Gastroenterology

## 2018-07-19 ENCOUNTER — Ambulatory Visit: Payer: No Typology Code available for payment source | Admitting: Anesthesiology

## 2018-07-19 DIAGNOSIS — E063 Autoimmune thyroiditis: Secondary | ICD-10-CM | POA: Insufficient documentation

## 2018-07-19 DIAGNOSIS — M479 Spondylosis, unspecified: Secondary | ICD-10-CM | POA: Diagnosis not present

## 2018-07-19 DIAGNOSIS — Z8249 Family history of ischemic heart disease and other diseases of the circulatory system: Secondary | ICD-10-CM | POA: Diagnosis not present

## 2018-07-19 DIAGNOSIS — Z1211 Encounter for screening for malignant neoplasm of colon: Secondary | ICD-10-CM

## 2018-07-19 DIAGNOSIS — K219 Gastro-esophageal reflux disease without esophagitis: Secondary | ICD-10-CM | POA: Diagnosis not present

## 2018-07-19 DIAGNOSIS — Z79899 Other long term (current) drug therapy: Secondary | ICD-10-CM | POA: Insufficient documentation

## 2018-07-19 DIAGNOSIS — K635 Polyp of colon: Secondary | ICD-10-CM | POA: Diagnosis not present

## 2018-07-19 DIAGNOSIS — K640 First degree hemorrhoids: Secondary | ICD-10-CM | POA: Diagnosis not present

## 2018-07-19 DIAGNOSIS — D125 Benign neoplasm of sigmoid colon: Secondary | ICD-10-CM | POA: Diagnosis not present

## 2018-07-19 DIAGNOSIS — D120 Benign neoplasm of cecum: Secondary | ICD-10-CM

## 2018-07-19 DIAGNOSIS — F17210 Nicotine dependence, cigarettes, uncomplicated: Secondary | ICD-10-CM | POA: Insufficient documentation

## 2018-07-19 HISTORY — PX: POLYPECTOMY: SHX5525

## 2018-07-19 HISTORY — DX: Nausea with vomiting, unspecified: R11.2

## 2018-07-19 HISTORY — DX: Presence of dental prosthetic device (complete) (partial): Z97.2

## 2018-07-19 HISTORY — PX: COLONOSCOPY WITH PROPOFOL: SHX5780

## 2018-07-19 HISTORY — DX: Nausea with vomiting, unspecified: Z98.890

## 2018-07-19 HISTORY — DX: Autoimmune thyroiditis: E06.3

## 2018-07-19 SURGERY — COLONOSCOPY WITH PROPOFOL
Anesthesia: General | Site: Rectum

## 2018-07-19 MED ORDER — LACTATED RINGERS IV SOLN
INTRAVENOUS | Status: DC
  Administered 2018-07-19: 08:00:00 via INTRAVENOUS

## 2018-07-19 MED ORDER — SODIUM CHLORIDE 0.9 % IV SOLN: INTRAVENOUS | Status: DC

## 2018-07-19 MED ORDER — PROPOFOL 10 MG/ML IV BOLUS
INTRAVENOUS | Status: DC | PRN
  Administered 2018-07-19: 100 mg via INTRAVENOUS
  Administered 2018-07-19: 50 mg via INTRAVENOUS
  Administered 2018-07-19: 40 mg via INTRAVENOUS
  Administered 2018-07-19 (×2): 30 mg via INTRAVENOUS
  Administered 2018-07-19 (×2): 40 mg via INTRAVENOUS
  Administered 2018-07-19: 30 mg via INTRAVENOUS

## 2018-07-19 MED ORDER — ONDANSETRON HCL 4 MG/2ML IJ SOLN: 4.0000 mg | Freq: Once | INTRAMUSCULAR | Status: DC | PRN

## 2018-07-19 MED ORDER — STERILE WATER FOR IRRIGATION IR SOLN
Status: DC | PRN
  Administered 2018-07-19: 08:00:00

## 2018-07-19 MED ORDER — LIDOCAINE HCL (CARDIAC) PF 100 MG/5ML IV SOSY
PREFILLED_SYRINGE | INTRAVENOUS | Status: DC | PRN
  Administered 2018-07-19: 30 mg via INTRAVENOUS

## 2018-07-19 SURGICAL SUPPLY — 7 items
CANISTER SUCT 1200ML W/VALVE (MISCELLANEOUS) ×2 IMPLANT
GOWN CVR UNV OPN BCK APRN NK (MISCELLANEOUS) ×2 IMPLANT
GOWN ISOL THUMB LOOP REG UNIV (MISCELLANEOUS) ×2
KIT ENDO PROCEDURE OLY (KITS) ×2 IMPLANT
SNARE SHORT THROW 13M SML OVAL (MISCELLANEOUS) ×2 IMPLANT
TRAP ETRAP POLY (MISCELLANEOUS) ×2 IMPLANT
WATER STERILE IRR 250ML POUR (IV SOLUTION) ×2 IMPLANT

## 2018-07-19 NOTE — Anesthesia Procedure Notes (Signed)
Date/Time: 07/19/2018 8:14 AM Performed by: Cameron Ali, CRNA Pre-anesthesia Checklist: Patient identified, Emergency Drugs available, Suction available, Timeout performed and Patient being monitored Patient Re-evaluated:Patient Re-evaluated prior to induction Oxygen Delivery Method: Nasal cannula Placement Confirmation: positive ETCO2

## 2018-07-19 NOTE — H&P (Signed)
Lucilla Lame, MD [address] [place], [place] [address] Phone: [phone] Fax : [phone]  Primary Care Physician:  Paulene Floor Primary Gastroenterologist:  Dr. Allen Norris  Pre-Procedure History & Physical: HPI:  Brandy Ballard is a 52 y.o. female is here for a screening colonoscopy.   Past Medical History:  Diagnosis Date  . Allergy   . Anemia   . Anxiety   . Arthritis    lower back  . Blood transfusion without reported diagnosis   . Dental bridge present    permanent lower retainer  . Depression   . GERD (gastroesophageal reflux disease)   . Hashimoto's disease   . PONV (postoperative nausea and vomiting)    nausea only  . Thyroid disease     Past Surgical History:  Procedure Laterality Date  . ABDOMINAL HYSTERECTOMY    . OVARIAN CYST REMOVAL      Prior to Admission medications   Medication Sig Start Date End Date Taking? Authorizing Provider  acetaminophen (TYLENOL) 500 MG tablet Take 1,000 mg by mouth every 6 (six) hours as needed.   Yes [provider]  amoxicillin-clavulanate (AUGMENTIN) 875-125 MG tablet Take 1 tablet by mouth 2 (two) times daily for 10 days. 07/13/18 07/23/18 Yes Trinna Post, PA-C  cetirizine (ZYRTEC) 10 MG tablet Take 10 mg by mouth daily.   Yes [provider]  Nutritional Supplements (ESTROVEN PO) Take by mouth.   Yes [provider]  omeprazole (PRILOSEC) 40 MG capsule Take 40 mg by mouth daily.   Yes [provider]  albuterol (PROVENTIL HFA;VENTOLIN HFA) 108 (90 Base) MCG/ACT inhaler Inhale 2 puffs into the lungs every 6 (six) hours as needed for wheezing or shortness of breath. Patient not taking: Reported on 07/19/2018 07/05/18   Darlin Priestly, PA-C  atorvastatin (LIPITOR) 10 MG tablet Take 1 tablet (10 mg total) by mouth daily. Patient not taking: Reported on 07/13/2018 07/02/18   Trinna Post, PA-C  benzonatate (TESSALON PERLES) 100 MG capsule Take 1 capsule (100  mg total) by mouth 3 (three) times daily as needed for up to 7 days. Patient not taking: Reported on 07/19/2018 07/13/18 07/20/18  Trinna Post, PA-C  doxycycline (MONODOX) 100 MG capsule Take by mouth. 09/14/17   [provider]  ketoconazole (NIZORAL) 2 % cream Apply daily to rash in skin folds and around ears as needed 09/01/17   [provider]  metroNIDAZOLE (METROCREAM) 0.75 % cream Apply twice daily to rash around mouth as needed until clear 09/01/17   [provider]  predniSONE (STERAPRED UNI-PAK 21 TAB) 10 MG (21) TBPK tablet Take as directed on packaging. Patient not taking: Reported on 07/13/2018 07/05/18   Darlin Priestly, PA-C  varenicline (CHANTIX STARTING MONTH PAK) 0.5 MG X 11 & 1 MG X 42 tablet One 0.5 mg tablet by mouth once daily for 3 days, then increase to one 0.5 mg tablet 2x daily for 4 days, then one 1 mg tablet twice daily. 07/13/18   Trinna Post, PA-C    Allergies as of 06/30/2018  . (No Known Allergies)    Family History  Problem Relation Age of Onset  . Meniere's disease Mother   . Hypertension Father   . Atrial fibrillation Father   . CVA Father   . Breast cancer Paternal Grandmother   . Heart attack Paternal Grandfather   . CVA Paternal Grandfather   . Polycystic ovary syndrome Daughter     Social History  Socioeconomic History  . Marital status: Married    Spouse name: Not on file  . Number of children: Not on file  . Years of education: Not on file  . Highest education level: Not on file  Occupational History  . Not on file  Social Needs  . Financial resource strain: Not on file  . Food insecurity:    Worry: Not on file    Inability: Not on file  . Transportation needs:    Medical: Not on file    Non-medical: Not on file  Tobacco Use  . Smoking status: Current Every Day Smoker    Packs/day: 0.50    Years: 20.00    Pack years: 10.00    Types: Cigarettes  . Smokeless tobacco: Never Used  Substance  and Sexual Activity  . Alcohol use: Not Currently  . Drug use: Never  . Sexual activity: Not on file  Lifestyle  . Physical activity:    Days per week: Not on file    Minutes per session: Not on file  . Stress: Not on file  Relationships  . Social connections:    Talks on phone: Not on file    Gets together: Not on file    Attends religious service: Not on file    Active member of club or organization: Not on file    Attends meetings of clubs or organizations: Not on file    Relationship status: Not on file  . Intimate partner violence:    Fear of current or ex partner: Not on file    Emotionally abused: Not on file    Physically abused: Not on file    Forced sexual activity: Not on file  Other Topics Concern  . Not on file  Social History Narrative  . Not on file    Review of Systems: See HPI, otherwise negative ROS  Physical Exam: BP 110/75   Pulse 92   Temp (!) 97 F (36.1 C) (Temporal)   Resp 16   Ht 5' 5.5" (1.664 m)   Wt 91.2 kg   SpO2 97%   BMI 32.94 kg/m  General:   Alert,  pleasant and cooperative in NAD Head:  Normocephalic and atraumatic. Neck:  Supple; no masses or thyromegaly. Lungs:  Clear throughout to auscultation.    Heart:  Regular rate and rhythm. Abdomen:  Soft, nontender and nondistended. Normal bowel sounds, without guarding, and without rebound.   Neurologic:  Alert and  oriented x4;  grossly normal neurologically.  Impression/Plan: Brandy Ballard is now here to undergo a screening colonoscopy.  Risks, benefits, and alternatives regarding colonoscopy have been reviewed with the patient.  Questions have been answered.  All parties agreeable.

## 2018-07-19 NOTE — Op Note (Signed)
[facility]astroenterology Patient Name: Brandy Ballard Procedure Date: 07/19/2018 8:09 AM MRN: [id number] Account #: [id number] Date of Birth: 1966-08-11 Admit Type: Outpatient Age: 52 Room: [facility] OR ROOM 01 Gender: Female Note Status: Finalized Procedure:            Colonoscopy Indications:          Screening for colorectal malignant neoplasm Providers:            Lucilla Lame MD, MD Referring MD:         Wendee Beavers. Terrilee Croak (Referring MD) Medicines:            Propofol per Anesthesia Complications:        No immediate complications. Procedure:            Pre-Anesthesia Assessment:                       - Prior to the procedure, a History and Physical was                        performed, and patient medications and allergies were                        reviewed. The patient's tolerance of previous                        anesthesia was also reviewed. The risks and benefits of                        the procedure and the sedation options and risks were                        discussed with the patient. All questions were                        answered, and informed consent was obtained. Prior                        Anticoagulants: The patient has taken no previous                        anticoagulant or antiplatelet agents. ASA Grade                        Assessment: II - A patient with mild systemic disease.                        After reviewing the risks and benefits, the patient was                        deemed in satisfactory condition to undergo the                        procedure.                       After obtaining informed consent, the colonoscope was                        passed under direct vision. Throughout the procedure,  the patient's blood pressure, pulse, and oxygen                        saturations were monitored continuously. The                        Colonoscope was introduced through the anus and      advanced to the the cecum, identified by appendiceal                        orifice and ileocecal valve. The colonoscopy was                        performed without difficulty. The patient tolerated the                        procedure well. The quality of the bowel preparation                        was excellent. Findings:      The perianal and digital rectal examinations were normal.      A 6 mm polyp was found in the cecum. The polyp was sessile. The polyp       was removed with a cold snare. Resection and retrieval were complete.      A 3 mm polyp was found in the sigmoid colon. The polyp was sessile. The       polyp was removed with a cold snare. Resection and retrieval were       complete.      Non-bleeding internal hemorrhoids were found during retroflexion. The       hemorrhoids were Grade I (internal hemorrhoids that do not prolapse). Impression:           - One 6 mm polyp in the cecum, removed with a cold                        snare. Resected and retrieved.                       - One 3 mm polyp in the sigmoid colon, removed with a                        cold snare. Resected and retrieved.                       - Non-bleeding internal hemorrhoids. Recommendation:       - Discharge patient to home.                       - Resume previous diet.                       - Continue present medications.                       - Await pathology results.                       - Repeat colonoscopy in 5 years if polyp adenoma and 10  years if hyperplastic Procedure Code(s):    --- Professional ---                       [phone], Colonoscopy, flexible; with removal of tumor(s),                        polyp(s), or other lesion(s) by snare technique Diagnosis Code(s):    --- Professional ---                       Z12.11, Encounter for screening for malignant neoplasm                        of colon                       D12.0, Benign neoplasm of cecum                        D12.5, Benign neoplasm of sigmoid colon CPT copyright 2018 American Medical Association. All rights reserved. The codes documented in this report are preliminary and upon coder review may  be revised to meet current compliance requirements. Lucilla Lame MD, MD 07/19/2018 8:32:16 AM This report has been signed electronically. Number of Addenda: 0 Note Initiated On: 07/19/2018 8:09 AM Scope Withdrawal Time: 0 hours 8 minutes 20 seconds  Total Procedure Duration: 0 hours 12 minutes 33 seconds       [facility]

## 2018-07-19 NOTE — Transfer of Care (Signed)
Immediate Anesthesia Transfer of Care Note  Patient: Brandy Ballard  Procedure(s) Performed: COLONOSCOPY WITH Biopsies (N/A Rectum) POLYPECTOMY (N/A Rectum)  Patient Location: PACU  Anesthesia Type: General  Level of Consciousness: awake, alert  and patient cooperative  Airway and Oxygen Therapy: Patient Spontanous Breathing and Patient connected to supplemental oxygen  Post-op Assessment: Post-op Vital signs reviewed, Patient's Cardiovascular Status Stable, Respiratory Function Stable, Patent Airway and No signs of Nausea or vomiting  Post-op Vital Signs: Reviewed and stable  Complications: No apparent anesthesia complications

## 2018-07-19 NOTE — Anesthesia Postprocedure Evaluation (Signed)
Anesthesia Post Note  Patient: Brandy Ballard  Procedure(s) Performed: COLONOSCOPY WITH Biopsies (N/A Rectum) POLYPECTOMY (N/A Rectum)  Patient location during evaluation: PACU Anesthesia Type: General Level of consciousness: awake Pain management: pain level controlled Vital Signs Assessment: post-procedure vital signs reviewed and stable Respiratory status: respiratory function stable Cardiovascular status: stable Postop Assessment: no signs of nausea or vomiting Anesthetic complications: no    Veda Canning

## 2018-07-19 NOTE — Anesthesia Preprocedure Evaluation (Signed)
Anesthesia Evaluation  Patient identified by MRN, date of birth, ID band Patient awake    Reviewed: Allergy & Precautions, NPO status , Patient's Chart, lab work & pertinent test results  History of Anesthesia Complications (+) PONV  Airway Mallampati: II  TM Distance: >3 FB     Dental   Pulmonary Current Smoker,    breath sounds clear to auscultation       Cardiovascular  Rhythm:Regular Rate:Normal  HLD   Neuro/Psych Anxiety Depression    GI/Hepatic GERD  ,  Endo/Other    Renal/GU      Musculoskeletal  (+) Arthritis ,   Abdominal   Peds  Hematology  (+) anemia ,   Anesthesia Other Findings   Reproductive/Obstetrics                             Anesthesia Physical Anesthesia Plan  ASA: III  Anesthesia Plan: General   Post-op Pain Management:    Induction: Intravenous  PONV Risk Score and Plan:   Airway Management Planned: Simple Face Mask and Natural Airway  Additional Equipment:   Intra-op Plan:   Post-operative Plan:   Informed Consent: I have reviewed the patients History and Physical, chart, labs and discussed the procedure including the risks, benefits and alternatives for the proposed anesthesia with the patient or authorized representative who has indicated his/her understanding and acceptance.     Plan Discussed with: CRNA  Anesthesia Plan Comments:         Anesthesia Quick Evaluation

## 2018-07-20 ENCOUNTER — Encounter: Payer: Self-pay | Admitting: Gastroenterology

## 2018-07-21 ENCOUNTER — Encounter: Payer: Self-pay | Admitting: Gastroenterology

## 2018-07-21 LAB — SURGICAL PATHOLOGY

## 2018-08-03 ENCOUNTER — Telehealth: Payer: Self-pay | Admitting: Physician Assistant

## 2018-08-03 NOTE — Telephone Encounter (Signed)
Noted, thank you

## 2018-08-03 NOTE — Telephone Encounter (Signed)
Pt called saying the ENT Dr Tami Ribas put her on an antibiotic and has ordered a CT. He is also going t do allergy testing.  She just wanted to update you on her referral.  Pt's CB#  [phone]  Thanks Con Memos

## 2018-08-10 ENCOUNTER — Other Ambulatory Visit: Payer: Self-pay | Admitting: Physician Assistant

## 2018-08-10 DIAGNOSIS — Z1231 Encounter for screening mammogram for malignant neoplasm of breast: Secondary | ICD-10-CM

## 2018-08-17 ENCOUNTER — Encounter: Payer: Self-pay | Admitting: Physician Assistant

## 2018-08-17 ENCOUNTER — Ambulatory Visit (INDEPENDENT_AMBULATORY_CARE_PROVIDER_SITE_OTHER): Payer: No Typology Code available for payment source | Admitting: Physician Assistant

## 2018-08-17 ENCOUNTER — Other Ambulatory Visit: Payer: Self-pay

## 2018-08-17 VITALS — BP 120/80 | HR 84 | Temp 98.2°F | Resp 16 | Ht 65.0 in | Wt 215.8 lb

## 2018-08-17 DIAGNOSIS — Z0000 Encounter for general adult medical examination without abnormal findings: Secondary | ICD-10-CM | POA: Diagnosis not present

## 2018-08-17 DIAGNOSIS — K219 Gastro-esophageal reflux disease without esophagitis: Secondary | ICD-10-CM

## 2018-08-17 DIAGNOSIS — R739 Hyperglycemia, unspecified: Secondary | ICD-10-CM | POA: Diagnosis not present

## 2018-08-17 DIAGNOSIS — E785 Hyperlipidemia, unspecified: Secondary | ICD-10-CM

## 2018-08-17 DIAGNOSIS — Z124 Encounter for screening for malignant neoplasm of cervix: Secondary | ICD-10-CM | POA: Diagnosis not present

## 2018-08-17 DIAGNOSIS — R5383 Other fatigue: Secondary | ICD-10-CM

## 2018-08-17 DIAGNOSIS — Z720 Tobacco use: Secondary | ICD-10-CM

## 2018-08-17 NOTE — Progress Notes (Signed)
Patient: Brandy Ballard, Female    DOB: [birth year], 52 y.o.   MRN: [id number] Visit Date: 08/18/2018  Today's Provider: Trinna Post, PA-C   Chief Complaint  Patient presents with  . Annual Exam   Subjective:    Annual physical exam Brandy Ballard is a 52 y.o. female who presents today for health maintenance and complete physical. She feels poorly. She reports exercising active with daily activites. She reports she is sleeping well. 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  PAP Smear: Due today. Originally agreed to perform PAP as part of today's CPE but patient reports that she wants to keep that care separate and would like to see an OBGYN. Patient reports that she had a hysterectomy many years ago but is unsure if she has a cervix. Records were requested from the facility she provided. Fax returned as patient never seen there.  07/19/18 Colonoscopy-sessile serrated adenoma and hyperplastic polyps, recheck in 5 yrs  GERD: Patient requesting referral to GI doctor for persistent acid reflux despite continued therapy on omeprazole. Reports her father had two stomach polyps removed and she feels she needs an EGD.  History of Hypothyroidism: Patient reports she had a goiter in her college years. She reports her thyroid labs were never abnormal however her endocrinologist at the time treated her with synthroid to make them "more normal." She says she stopped this medication herself many years ago due to financial issues and has never resumed. Her thyroid labs were checked one month ago and TSH, T3 and T4 were all WNL. She reports that she feels very fatigued, has weight gain, has hot and cold flashes and attributes this to her thyroid. She wants to be referred to endocrinologist who will start her on medication for this.  Sinusitis: Reports she saw Dr. Tami Ribas who is treating her for sinusitis with three weeks of clindamycin with plan to follow up with CT scan. She reports she has postponed her  smoking cessation due to this as she wants to sort it all out. She is taking Chantix without issue.   HLD: She was started on 10 mg lipitor nightly for HLD. She is doing well with this medication.  Lipid Panel     Component Value Date/Time   CHOL 245 (H) 08/17/2018 1102   TRIG 134 08/17/2018 1102   HDL 65 08/17/2018 1102   CHOLHDL 3.8 08/17/2018 1102   LDLCALC 153 (H) 08/17/2018 1102    -----------------------------------------------------------------     Review of Systems  Constitutional: Positive for activity change, appetite change, diaphoresis, fatigue and unexpected weight change.  HENT: Positive for congestion, ear pain, postnasal drip, rhinorrhea, sinus pressure, sinus pain, sneezing, tinnitus, trouble swallowing and voice change.   Eyes: Positive for discharge, redness and itching.  Respiratory: Positive for cough.   Cardiovascular: Positive for palpitations and leg swelling.  Gastrointestinal: Positive for abdominal distention and diarrhea.  Endocrine: Positive for cold intolerance, heat intolerance and polyphagia.  Genitourinary: Negative.   Musculoskeletal: Positive for arthralgias, back pain, joint swelling, myalgias, neck pain and neck stiffness.  Skin: Negative.   Allergic/Immunologic: Positive for environmental allergies.  Neurological: Positive for dizziness, numbness and headaches.  Hematological: Negative.   Psychiatric/Behavioral: Positive for sleep disturbance.    Social History      She  reports that she has been smoking cigarettes. She has a 10.00 pack-year smoking history. She has never used smokeless tobacco. She reports that she drank alcohol. She reports that she does not use drugs.  Social History   Socioeconomic History  . Marital status: Married    Spouse name: Not on file  . Number of children: Not on file  . Years of education: Not on file  . Highest education level: Not on file  Occupational History  . Not on file  Social Needs  .  Financial resource strain: Not on file  . Food insecurity:    Worry: Not on file    Inability: Not on file  . Transportation needs:    Medical: Not on file    Non-medical: Not on file  Tobacco Use  . Smoking status: Current Every Day Smoker    Packs/day: 0.50    Years: 20.00    Pack years: 10.00    Types: Cigarettes  . Smokeless tobacco: Never Used  Substance and Sexual Activity  . Alcohol use: Not Currently  . Drug use: Never  . Sexual activity: Not on file  Lifestyle  . Physical activity:    Days per week: Not on file    Minutes per session: Not on file  . Stress: Not on file  Relationships  . Social connections:    Talks on phone: Not on file    Gets together: Not on file    Attends religious service: Not on file    Active member of club or organization: Not on file    Attends meetings of clubs or organizations: Not on file    Relationship status: Not on file  Other Topics Concern  . Not on file  Social History Narrative  . Not on file    Past Medical History:  Diagnosis Date  . Allergy   . Anemia   . Anxiety   . Arthritis    lower back  . Blood transfusion without reported diagnosis   . Dental bridge present    permanent lower retainer  . Depression   . GERD (gastroesophageal reflux disease)   . Hashimoto's disease   . PONV (postoperative nausea and vomiting)    nausea only  . Thyroid disease      Patient Active Problem List   Diagnosis Date Noted  . Tobacco abuse 08/18/2018  . Encounter for screening colonoscopy   . Benign neoplasm of cecum   . Polyp of sigmoid colon     Past Surgical History:  Procedure Laterality Date  . ABDOMINAL HYSTERECTOMY    . COLONOSCOPY WITH PROPOFOL N/A 07/19/2018   Procedure: COLONOSCOPY WITH Biopsies;  Surgeon: Lucilla Lame, MD;  Location: [facility];  Service: Endoscopy;  Laterality: N/A;  . OVARIAN CYST REMOVAL    . POLYPECTOMY N/A 07/19/2018   Procedure: POLYPECTOMY;  Surgeon: Lucilla Lame, MD;   Location: [facility];  Service: Endoscopy;  Laterality: N/A;    Family History        Family Status  Relation Name Status  . Mother  Alive  . Father  Alive  . PGM  Deceased  . PGF  Deceased  . Daughter  Alive  . MGM  Deceased  . MGF  Deceased        Her family history includes Atrial fibrillation in her father; Breast cancer in her paternal grandmother; CVA in her father and paternal grandfather; Heart attack in her paternal grandfather; Hypertension in her father; Meniere's disease in her mother; Polycystic ovary syndrome in her daughter.      Allergies  Allergen Reactions  . Benzoin Compound Rash     Current Outpatient Medications:  .  acetaminophen (TYLENOL) 500  MG tablet, Take 1,000 mg by mouth every 6 (six) hours as needed., Disp: , Rfl:  .  atorvastatin (LIPITOR) 10 MG tablet, Take 10 mg by mouth daily., Disp: , Rfl:  .  doxycycline (MONODOX) 100 MG capsule, Take by mouth., Disp: , Rfl:  .  fluticasone (FLONASE) 50 MCG/ACT nasal spray, Place into both nostrils daily., Disp: , Rfl:  .  ketoconazole (NIZORAL) 2 % cream, Apply daily to rash in skin folds and around ears as needed, Disp: , Rfl:  .  metroNIDAZOLE (METROCREAM) 0.75 % cream, Apply twice daily to rash around mouth as needed until clear, Disp: , Rfl:  .  Nutritional Supplements (ESTROVEN PO), Take by mouth., Disp: , Rfl:  .  omeprazole (PRILOSEC) 40 MG capsule, Take 40 mg by mouth daily., Disp: , Rfl:  .  cetirizine (ZYRTEC) 10 MG tablet, Take 10 mg by mouth daily., Disp: , Rfl:  .  varenicline (CHANTIX STARTING MONTH PAK) 0.5 MG X 11 & 1 MG X 42 tablet, One 0.5 mg tablet by mouth once daily for 3 days, then increase to one 0.5 mg tablet 2x daily for 4 days, then one 1 mg tablet twice daily. (Patient not taking: Reported on 08/17/2018), Disp: 53 tablet, Rfl: 0   Patient Care Team: Paulene Floor as PCP - General (Physician Assistant)      Objective:   Vitals: BP 120/80 (BP Location: Left Arm,  Patient Position: Sitting, Cuff Size: Normal)   Pulse 84   Temp 98.2 F (36.8 C) (Oral)   Resp 16   Ht 5\' 5"  (1.651 m)   Wt 215 lb 12.8 oz (97.9 kg)   SpO2 98%   BMI 35.91 kg/m    Vitals:   08/17/18 1024  BP: 120/80  Pulse: 84  Resp: 16  Temp: 98.2 F (36.8 C)  TempSrc: Oral  SpO2: 98%  Weight: 215 lb 12.8 oz (97.9 kg)  Height: 5\' 5"  (1.651 m)     Physical Exam  Constitutional: She is oriented to person, place, and time. She appears well-developed and well-nourished.  HENT:  Head: Normocephalic and atraumatic.  Right Ear: External ear normal.  Left Ear: External ear normal.  Nose: Nose normal.  Mouth/Throat: Oropharynx is clear and moist.  Eyes: Pupils are equal, round, and reactive to light. EOM are normal.  Neck: Normal range of motion. Neck supple.  Cardiovascular: Normal rate, regular rhythm, normal heart sounds and intact distal pulses.  Pulmonary/Chest: Effort normal and breath sounds normal.  Abdominal: Soft. Bowel sounds are normal.  Musculoskeletal: Normal range of motion.  Neurological: She is alert and oriented to person, place, and time.  Skin: Skin is warm and dry.  Psychiatric: She has a normal mood and affect. Her behavior is normal. Judgment and thought content normal.     Depression Screen PHQ 2/9 Scores 08/17/2018 06/30/2018  PHQ - 2 Score 0 0  PHQ- 9 Score 9 4      Assessment & Plan:     Routine Health Maintenance and Physical Exam  Exercise Activities and Dietary recommendations Goals   None     Immunization History  Administered Date(s) Administered  . Influenza-Unspecified 06/16/2018    Health Maintenance  Topic Date Due  . TETANUS/TDAP  11/28/1986  . PAP SMEAR  11/27/1988  . MAMMOGRAM  11/27/2017  . COLONOSCOPY  07/20/2023  . INFLUENZA VACCINE  Completed  . HIV Screening  Completed     Discussed health benefits of physical activity, and encouraged her  to engage in regular exercise appropriate for her age and  condition.   1. Annual physical exam  - CBC with Differential/Platelet - Comprehensive metabolic panel - Lipid Panel With LDL/HDL Ratio - TSH  2. Cervical cancer screening  Patient unsure if she retains cervix. She wishes to be seen by OBGYN.   - Ambulatory referral to Obstetrics / Gynecology  3. Gastroesophageal reflux disease, esophagitis presence not specified  - Ambulatory referral to Gastroenterology  4. Fatigue, unspecified type  Patient desires endocrinologist who will treat her symptoms even if labs are normal. I do not know of anybody who does this but I will refer per her request. I have counseled patient that her symptoms also overlap with sleep apnea but she refuses sleep study. Additionally, I think some of her reports of hot flashes and cold chills, particularly night sweats, are related to menopause.   - Ambulatory referral to Endocrinology  5. Hyperglycemia  - HgB A1c  6. Hyperlipidemia, unspecified hyperlipidemia type  Cholesterol improved but not at goal. Increase lipitor to 20 mg daily. F/u 3 months.   - Lipid Profile  7. Tobacco abuse  Continues to smoke. Continues to take chantix.   Return in about 3 months (around 11/17/2018) for lipids .  The entirety of the information documented in the History of Present Illness, Review of Systems and Physical Exam were personally obtained by me. Portions of this information were initially documented by Lynford Humphrey, CMA and reviewed by me for thoroughness and accuracy.    --------------------------------------------------------------------    Trinna Post, PA-C  [facility]

## 2018-08-18 DIAGNOSIS — Z720 Tobacco use: Secondary | ICD-10-CM | POA: Insufficient documentation

## 2018-08-18 LAB — LIPID PANEL
Chol/HDL Ratio: 3.8 ratio (ref 0.0–4.4)
Cholesterol, Total: 245 mg/dL — ABNORMAL HIGH (ref 100–199)
HDL: 65 mg/dL (ref >39)
LDL Calculated: 153 mg/dL — ABNORMAL HIGH (ref 0–99)
Triglycerides: 134 mg/dL (ref 0–149)
VLDL Cholesterol Cal: 27 mg/dL (ref 5–40)

## 2018-08-18 LAB — HEMOGLOBIN A1C
Est. average glucose Bld gHb Est-mCnc: 117 mg/dL
Hgb A1c MFr Bld: 5.7 % — ABNORMAL HIGH (ref 4.8–5.6)

## 2018-08-19 ENCOUNTER — Telehealth: Payer: Self-pay | Admitting: *Deleted

## 2018-08-19 MED ORDER — ATORVASTATIN CALCIUM 20 MG PO TABS: 20.0000 mg | ORAL_TABLET | Freq: Every day | ORAL | 1 refills | Status: DC

## 2018-08-19 NOTE — Telephone Encounter (Signed)
-----   Message from Trinna Post, [place] sent at 08/18/2018  6:31 PM EST ----- Cholesterol improved but still elevated, increase lipitor to 20 mg nightly. A1c shows prediabetes. Focus on reducing added sugar intake and increasing exercise.

## 2018-08-19 NOTE — Telephone Encounter (Signed)
Patient was notified of results. Expressed understanding. Rx sent to pharmacy. 

## 2018-09-06 ENCOUNTER — Other Ambulatory Visit (HOSPITAL_COMMUNITY)
Admission: RE | Admit: 2018-09-06 | Discharge: 2018-09-06 | Disposition: A | Discharge status: Home / Self Care | Payer: No Typology Code available for payment source | Source: Ambulatory Visit | Attending: Obstetrics and Gynecology | Admitting: Obstetrics and Gynecology

## 2018-09-06 ENCOUNTER — Encounter: Payer: Self-pay | Admitting: Obstetrics and Gynecology

## 2018-09-06 ENCOUNTER — Ambulatory Visit (INDEPENDENT_AMBULATORY_CARE_PROVIDER_SITE_OTHER): Payer: No Typology Code available for payment source | Admitting: Obstetrics and Gynecology

## 2018-09-06 VITALS — BP 120/82 | HR 85 | Ht 66.0 in | Wt 217.0 lb

## 2018-09-06 DIAGNOSIS — Z124 Encounter for screening for malignant neoplasm of cervix: Secondary | ICD-10-CM

## 2018-09-06 DIAGNOSIS — L720 Epidermal cyst: Secondary | ICD-10-CM | POA: Insufficient documentation

## 2018-09-06 DIAGNOSIS — N951 Menopausal and female climacteric states: Secondary | ICD-10-CM

## 2018-09-06 NOTE — Progress Notes (Signed)
Patient ID: Brandy Ballard, female   DOB: Feb 12, 1966, 52 y.o.   MRN: [id number]  Reason for Consult: Referral (Would like to know if she still has cervix )   Referred by Trinna Post, PA-C  Subjective:     HPI:  Brandy Ballard is a 52 y.o. female . She reports she has a hysterectomy 9-10 years ago. She denies issues with prior abnormal bleeding. She is having a lot of menopausal symptoms  Past Medical History:  Diagnosis Date  . Allergy   . Anemia   . Anxiety   . Arthritis    lower back  . Blood transfusion without reported diagnosis   . Dental bridge present    permanent lower retainer  . Depression   . GERD (gastroesophageal reflux disease)   . Hashimoto's disease   . PONV (postoperative nausea and vomiting)    nausea only  . Sinus disease   . Thyroid disease    Family History  Problem Relation Age of Onset  . Meniere's disease Mother   . Hypertension Father   . Atrial fibrillation Father   . CVA Father   . Breast cancer Paternal Grandmother   . Heart attack Paternal Grandfather   . CVA Paternal Grandfather   . Polycystic ovary syndrome Daughter    Past Surgical History:  Procedure Laterality Date  . ABDOMINAL HYSTERECTOMY    . COLONOSCOPY WITH PROPOFOL N/A 07/19/2018   Procedure: COLONOSCOPY WITH Biopsies;  Surgeon: Lucilla Lame, MD;  Location: [facility];  Service: Endoscopy;  Laterality: N/A;  . OVARIAN CYST REMOVAL    . POLYPECTOMY N/A 07/19/2018   Procedure: POLYPECTOMY;  Surgeon: Lucilla Lame, MD;  Location: [facility];  Service: Endoscopy;  Laterality: N/A;    Short Social History:  Social History   Tobacco Use  . Smoking status: Current Every Day Smoker    Packs/day: 0.25    Years: 20.00    Pack years: 5.00    Types: Cigarettes  . Smokeless tobacco: Never Used  Substance Use Topics  . Alcohol use: Not Currently    Allergies  Allergen Reactions  . Benzoin Compound Rash    Current Outpatient Medications  Medication Sig  Dispense Refill  . acetaminophen (TYLENOL) 500 MG tablet Take 1,000 mg by mouth every 6 (six) hours as needed.    Marland Kitchen atorvastatin (LIPITOR) 20 MG tablet Take 1 tablet (20 mg total) by mouth daily. 90 tablet 1  . cetirizine (ZYRTEC) 10 MG tablet Take 10 mg by mouth daily.    Marland Kitchen doxycycline (MONODOX) 100 MG capsule Take by mouth.    . fluticasone (FLONASE) 50 MCG/ACT nasal spray Place into both nostrils daily.    Marland Kitchen ketoconazole (NIZORAL) 2 % cream Apply daily to rash in skin folds and around ears as needed    . metroNIDAZOLE (METROCREAM) 0.75 % cream Apply twice daily to rash around mouth as needed until clear    . Nutritional Supplements (ESTROVEN PO) Take by mouth.    Marland Kitchen omeprazole (PRILOSEC) 40 MG capsule Take 40 mg by mouth daily.    . varenicline (CHANTIX STARTING MONTH PAK) 0.5 MG X 11 & 1 MG X 42 tablet One 0.5 mg tablet by mouth once daily for 3 days, then increase to one 0.5 mg tablet 2x daily for 4 days, then one 1 mg tablet twice daily. 53 tablet 0   No current facility-administered medications for this visit.     REVIEW OF SYSTEMS      Objective:  Objective   Vitals:   09/06/18 0906  BP: 120/82  Pulse: 85  Weight: 217 lb (98.4 kg)  Height: 5\' 6"  (1.676 m)   Body mass index is 35.02 kg/m.  Physical Exam  Constitutional: She is oriented to person, place, and time. She appears well-developed and well-nourished.  HENT:  Head: Normocephalic and atraumatic.  Eyes: Pupils are equal, round, and reactive to light. EOM are normal.  Cardiovascular: Normal rate and regular rhythm.  Pulmonary/Chest: Effort normal. No respiratory distress.  Genitourinary:  Genitourinary Comments: Normal bimanual pelvic exam. No masses appreciated. No pelvic fullness.  Cervix not palpated. Normal vaginal cuff. No abnormal discharge. Small keratin cyst on right labia. Patient desires to to be excised.   Neurological: She is alert and oriented to person, place, and time.  Skin: Skin is warm and dry.   Psychiatric: She has a normal mood and affect. Her behavior is normal. Judgment and thought content normal.  Nursing note and vitals reviewed.       Assessment/Plan:     No cervix seen on exam or palpated. Patient reports that she never had a history of abnormal pap smears. Discussed that if prior to the hysterectomy she never had CIN2 or greater than she could discontinue pap smears. Should continue yearly pelvic exams to evaluate left ovary which is remaining per patient.  Pap smear collected today.   Biopsy of right perineum. Removal of keratin from sebaceous cyst collection. Numbed with 8cc of lidocaine. Excised with 11 blade scalpel. Silver nitrate applied to stop bleeding.   Return in 2 weeks to discuss hormone therapy and follow up on biopsy of perineum  Adrian Prows MD [facility] 09/06/18 10:22 AM

## 2018-09-06 NOTE — Patient Instructions (Signed)
How to Take a Sitz Bath °A sitz bath is a warm water bath that is taken while you are sitting down. The water should only come up to your hips and should cover your buttocks. Your health care provider may recommend a sitz bath to help you: °· Clean the lower part of your body, including your genital area. °· With itching. °· With pain. °· With sore muscles or muscles that tighten or spasm. ° °How to take a sitz bath °Take 3-4 sitz baths per day or as told by your health care provider. °1. Partially fill a bathtub with warm water. You will only need the water to be deep enough to cover your hips and buttocks when you are sitting in it. °2. If your health care provider told you to put medicine in the water, follow the directions exactly. °3. Sit in the water and open the tub drain a little. °4. Turn on the warm water again to keep the tub at the correct level. Keep the water running constantly. °5. Soak in the water for 15-20 minutes or as told by your health care provider. °6. After the sitz bath, pat the affected area dry first. Do not rub it. °7. Be careful when you stand up after the sitz bath because you may feel dizzy. ° °Contact a health care provider if: °· Your symptoms get worse. Do not continue with sitz baths if your symptoms get worse. °· You have new symptoms. Do not continue with sitz baths until you talk with your health care provider. °This information is not intended to replace advice given to you by your health care provider. Make sure you discuss any questions you have with your health care provider. °Document Released: 06/07/2004 Document Revised: 02/13/2016 Document Reviewed: 09/13/2014 °Elsevier Interactive Patient Education © 2018 [employer] ° °

## 2018-09-07 ENCOUNTER — Ambulatory Visit
Admission: RE | Admit: 2018-09-07 | Discharge: 2018-09-07 | Disposition: A | Discharge status: Home / Self Care | Payer: No Typology Code available for payment source | Source: Ambulatory Visit | Attending: Physician Assistant | Admitting: Physician Assistant

## 2018-09-07 DIAGNOSIS — Z1231 Encounter for screening mammogram for malignant neoplasm of breast: Secondary | ICD-10-CM | POA: Diagnosis present

## 2018-09-07 LAB — FOLLICLE STIMULATING HORMONE: FSH: 64.6 m[IU]/mL

## 2018-09-07 LAB — ESTRADIOL: Estradiol: <5 pg/mL

## 2018-09-09 ENCOUNTER — Other Ambulatory Visit: Payer: Self-pay | Admitting: Obstetrics and Gynecology

## 2018-09-09 ENCOUNTER — Telehealth: Payer: Self-pay | Admitting: Obstetrics and Gynecology

## 2018-09-09 DIAGNOSIS — N951 Menopausal and female climacteric states: Secondary | ICD-10-CM

## 2018-09-09 LAB — CYTOLOGY - PAP
DIAGNOSIS: NEGATIVE
HPV (WINDOPATH): NOT DETECTED

## 2018-09-09 MED ORDER — ESTRADIOL 0.025 MG/24HR TD PTWK: 0.0250 mg | MEDICATED_PATCH | TRANSDERMAL | 11 refills | Status: DC

## 2018-09-09 NOTE — Progress Notes (Signed)
Called and discussed with patient, she would like to try hormone replacement. Prescription sent to pharmacy.

## 2018-09-09 NOTE — Telephone Encounter (Signed)
Patient is calling for labs results. Please advise. 

## 2018-09-24 ENCOUNTER — Inpatient Hospital Stay: Admission: RE | Admit: 2018-09-24 | Payer: No Typology Code available for payment source | Source: Ambulatory Visit

## 2018-09-27 ENCOUNTER — Ambulatory Visit: Payer: No Typology Code available for payment source | Admitting: Obstetrics and Gynecology

## 2018-09-28 ENCOUNTER — Encounter
Admission: RE | Admit: 2018-09-28 | Discharge: 2018-09-28 | Disposition: A | Discharge status: Home / Self Care | Payer: No Typology Code available for payment source | Source: Ambulatory Visit | Attending: Unknown Physician Specialty | Admitting: Unknown Physician Specialty

## 2018-09-28 ENCOUNTER — Other Ambulatory Visit: Payer: Self-pay

## 2018-09-28 ENCOUNTER — Inpatient Hospital Stay: Admission: RE | Admit: 2018-09-28 | Payer: No Typology Code available for payment source | Source: Ambulatory Visit

## 2018-09-28 DIAGNOSIS — Z01810 Encounter for preprocedural cardiovascular examination: Secondary | ICD-10-CM | POA: Insufficient documentation

## 2018-09-28 NOTE — Patient Instructions (Signed)
Your procedure is scheduled on: October 05, 2018 TUESDAY Report to Day Surgery on the 2nd floor of the [facility]. To find out your arrival time, please call [phone] between 1PM - 3PM on: October 04, 2018 MONDAY  REMEMBER: Instructions that are not followed completely may result in serious medical risk, up to and including death; or upon the discretion of your surgeon and anesthesiologist your surgery may need to be rescheduled.  Do not eat food after midnight the night before surgery.  No gum chewing, lozengers or hard candies.  You may however, drink CLEAR liquids up to 2 hours before you are scheduled to arrive for your surgery. Do not drink anything within 2 hours of the start of your surgery.  Clear liquids include: - water  - apple juice without pulp - gatorade - black coffee or tea (Do NOT add milk or creamers to the coffee or tea) Do NOT drink anything that is not on this list.  Type 1 and Type 2 diabetics should only drink water.  No Alcohol for 24 hours before or after surgery.  No Smoking including e-cigarettes for 24 hours prior to surgery.  No chewable tobacco products for at least 6 hours prior to surgery.  No nicotine patches on the day of surgery.  On the morning of surgery brush your teeth with toothpaste and water, you may rinse your mouth with mouthwash if you wish. Do not swallow any toothpaste or mouthwash.  Notify your doctor if there is any change in your medical condition (cold, fever, infection).  Do not wear jewelry, make-up, hairpins, clips or nail polish.  Do not wear lotions, powders, or perfumes.   Do not shave 48 hours prior to surgery.   Contacts and dentures may not be worn into surgery.  Do not bring valuables to the hospital, including drivers license, insurance or credit cards.  [facility] is not responsible for any belongings or valuables.   TAKE THESE MEDICATIONS THE MORNING OF SURGERY: LIPITOR CETIRIZINE  OMEPRAZOLE (take  one the night before and one on the morning of surgery - helps to prevent nausea after surgery.)   Use FLONASE on the day of surgery  Follow recommendations from Cardiologist, Pulmonologist or PCP regarding stopping Aspirin, Coumadin, Plavix, Eliquis, Pradaxa, or Pletal.  Stop Anti-inflammatories (NSAIDS) such as Advil, Aleve, Ibuprofen, Motrin, Naproxen, Naprosyn and Aspirin based products such as Excedrin, Goodys Powder, BC Powder. (May take Tylenol or Acetaminophen if needed.)  Stop ANY OVER THE COUNTER supplements until after surgery. (May continue Vitamin D, Vitamin B, and multivitamin.)  Wear comfortable clothing (specific to your surgery type) to the hospital.  Plan for stool softeners for home use.  If you are being discharged the day of surgery, you will not be allowed to drive home. You will need a responsible adult to drive you home and stay with you that night.   If you are taking public transportation, you will need to have a responsible adult with you. Please confirm with your physician that it is acceptable to use public transportation.   Please call [phone] if you have any questions about these instructions.

## 2018-10-04 ENCOUNTER — Ambulatory Visit: Payer: No Typology Code available for payment source | Admitting: Obstetrics and Gynecology

## 2018-10-05 ENCOUNTER — Ambulatory Visit
Admission: RE | Admit: 2018-10-05 | Discharge: 2018-10-05 | Disposition: A | Discharge status: Home / Self Care | Payer: No Typology Code available for payment source | Attending: Unknown Physician Specialty | Admitting: Unknown Physician Specialty

## 2018-10-05 ENCOUNTER — Other Ambulatory Visit: Payer: Self-pay

## 2018-10-05 ENCOUNTER — Encounter: Payer: Self-pay | Admitting: *Deleted

## 2018-10-05 ENCOUNTER — Ambulatory Visit: Payer: No Typology Code available for payment source | Admitting: Anesthesiology

## 2018-10-05 ENCOUNTER — Encounter
Admission: RE | Disposition: A | Discharge status: Home / Self Care | Payer: Self-pay | Source: Home / Self Care | Attending: Unknown Physician Specialty

## 2018-10-05 DIAGNOSIS — J343 Hypertrophy of nasal turbinates: Secondary | ICD-10-CM | POA: Diagnosis not present

## 2018-10-05 DIAGNOSIS — J338 Other polyp of sinus: Secondary | ICD-10-CM | POA: Diagnosis not present

## 2018-10-05 DIAGNOSIS — J324 Chronic pansinusitis: Secondary | ICD-10-CM | POA: Insufficient documentation

## 2018-10-05 DIAGNOSIS — J342 Deviated nasal septum: Secondary | ICD-10-CM | POA: Diagnosis not present

## 2018-10-05 HISTORY — PX: FRONTAL SINUS EXPLORATION: SHX6591

## 2018-10-05 HISTORY — PX: SEPTOPLASTY WITH ETHMOIDECTOMY, AND MAXILLARY ANTROSTOMY: SHX6090

## 2018-10-05 HISTORY — PX: IMAGE GUIDED SINUS SURGERY: SHX6570

## 2018-10-05 HISTORY — PX: SPHENOIDECTOMY: SHX2421

## 2018-10-05 HISTORY — PX: NASAL TURBINATE REDUCTION: SHX2072

## 2018-10-05 SURGERY — SINUS SURGERY, WITH IMAGING GUIDANCE
Anesthesia: General

## 2018-10-05 MED ORDER — PROPOFOL 500 MG/50ML IV EMUL
INTRAVENOUS | Status: DC | PRN
  Administered 2018-10-05: 100 ug/kg/min via INTRAVENOUS

## 2018-10-05 MED ORDER — FENTANYL CITRATE (PF) 100 MCG/2ML IJ SOLN
INTRAMUSCULAR | Status: AC
  Filled 2018-10-05: qty 2

## 2018-10-05 MED ORDER — SCOPOLAMINE 1 MG/3DAYS TD PT72
MEDICATED_PATCH | TRANSDERMAL | Status: AC
  Administered 2018-10-05: 1.5 mg via TRANSDERMAL
  Filled 2018-10-05: qty 1

## 2018-10-05 MED ORDER — ONDANSETRON HCL 4 MG/2ML IJ SOLN: 4.0000 mg | Freq: Once | INTRAMUSCULAR | Status: DC | PRN

## 2018-10-05 MED ORDER — ROCURONIUM BROMIDE 50 MG/5ML IV SOLN
INTRAVENOUS | Status: AC
  Filled 2018-10-05: qty 1

## 2018-10-05 MED ORDER — PHENYLEPHRINE HCL 10 % OP SOLN
OPHTHALMIC | Status: DC | PRN
  Administered 2018-10-05: 10 mL via NASAL

## 2018-10-05 MED ORDER — LIDOCAINE HCL (PF) 2 % IJ SOLN
INTRAMUSCULAR | Status: AC
  Filled 2018-10-05: qty 10

## 2018-10-05 MED ORDER — LIDOCAINE-EPINEPHRINE 1 %-1:100000 IJ SOLN
INTRAMUSCULAR | Status: AC
  Filled 2018-10-05: qty 1

## 2018-10-05 MED ORDER — PROPOFOL 10 MG/ML IV BOLUS
INTRAVENOUS | Status: AC
  Filled 2018-10-05: qty 20

## 2018-10-05 MED ORDER — LIDOCAINE HCL (PF) 4 % IJ SOLN
INTRAMUSCULAR | Status: AC
  Filled 2018-10-05: qty 10

## 2018-10-05 MED ORDER — LACTATED RINGERS IV SOLN
INTRAVENOUS | Status: DC
  Administered 2018-10-05: 07:00:00 via INTRAVENOUS

## 2018-10-05 MED ORDER — DEXAMETHASONE SODIUM PHOSPHATE 10 MG/ML IJ SOLN
INTRAMUSCULAR | Status: AC
  Filled 2018-10-05: qty 1

## 2018-10-05 MED ORDER — SUGAMMADEX SODIUM 200 MG/2ML IV SOLN
INTRAVENOUS | Status: AC
  Filled 2018-10-05: qty 2

## 2018-10-05 MED ORDER — DIPHENHYDRAMINE HCL 50 MG/ML IJ SOLN
INTRAMUSCULAR | Status: AC
  Filled 2018-10-05: qty 1

## 2018-10-05 MED ORDER — PROPOFOL 500 MG/50ML IV EMUL
INTRAVENOUS | Status: AC
  Filled 2018-10-05: qty 50

## 2018-10-05 MED ORDER — ONDANSETRON HCL 4 MG/2ML IJ SOLN
INTRAMUSCULAR | Status: DC | PRN
  Administered 2018-10-05: 4 mg via INTRAVENOUS

## 2018-10-05 MED ORDER — ONDANSETRON HCL 4 MG PO TABS: 4.0000 mg | ORAL_TABLET | Freq: Three times a day (TID) | ORAL | 1 refills | Status: DC | PRN

## 2018-10-05 MED ORDER — HYDROCODONE-ACETAMINOPHEN 5-325 MG PO TABS
1.0000 | ORAL_TABLET | ORAL | Status: DC | PRN
  Administered 2018-10-05: 1 via ORAL

## 2018-10-05 MED ORDER — SUGAMMADEX SODIUM 200 MG/2ML IV SOLN
INTRAVENOUS | Status: DC | PRN
  Administered 2018-10-05: 200 mg via INTRAVENOUS

## 2018-10-05 MED ORDER — FENTANYL CITRATE (PF) 100 MCG/2ML IJ SOLN
INTRAMUSCULAR | Status: DC | PRN
  Administered 2018-10-05 (×3): 50 ug via INTRAVENOUS

## 2018-10-05 MED ORDER — PROPOFOL 10 MG/ML IV BOLUS
INTRAVENOUS | Status: DC | PRN
  Administered 2018-10-05: 200 mg via INTRAVENOUS

## 2018-10-05 MED ORDER — DEXAMETHASONE SODIUM PHOSPHATE 10 MG/ML IJ SOLN
INTRAMUSCULAR | Status: DC | PRN
  Administered 2018-10-05: 10 mg via INTRAVENOUS

## 2018-10-05 MED ORDER — DIPHENHYDRAMINE HCL 50 MG/ML IJ SOLN
INTRAMUSCULAR | Status: DC | PRN
  Administered 2018-10-05: 12.5 mg via INTRAVENOUS

## 2018-10-05 MED ORDER — OXYMETAZOLINE HCL 0.05 % NA SOLN
NASAL | Status: AC
  Administered 2018-10-05: 6 via NASAL
  Filled 2018-10-05: qty 15

## 2018-10-05 MED ORDER — ROCURONIUM BROMIDE 100 MG/10ML IV SOLN
INTRAVENOUS | Status: DC | PRN
  Administered 2018-10-05: 30 mg via INTRAVENOUS
  Administered 2018-10-05: 50 mg via INTRAVENOUS
  Administered 2018-10-05: 20 mg via INTRAVENOUS

## 2018-10-05 MED ORDER — HYDROCODONE-ACETAMINOPHEN 5-325 MG PO TABS
ORAL_TABLET | ORAL | Status: AC
  Administered 2018-10-05: 1 via ORAL
  Filled 2018-10-05: qty 1

## 2018-10-05 MED ORDER — LIDOCAINE HCL (CARDIAC) PF 100 MG/5ML IV SOSY
PREFILLED_SYRINGE | INTRAVENOUS | Status: DC | PRN
  Administered 2018-10-05: 100 mg via INTRAVENOUS

## 2018-10-05 MED ORDER — FENTANYL CITRATE (PF) 100 MCG/2ML IJ SOLN
25.0000 ug | INTRAMUSCULAR | Status: DC | PRN
  Administered 2018-10-05: 25 ug via INTRAVENOUS

## 2018-10-05 MED ORDER — LIDOCAINE-EPINEPHRINE 1 %-1:100000 IJ SOLN
INTRAMUSCULAR | Status: DC | PRN
  Administered 2018-10-05: 13 mL

## 2018-10-05 MED ORDER — BACITRACIN ZINC 500 UNIT/GM EX OINT
TOPICAL_OINTMENT | CUTANEOUS | Status: DC | PRN
  Administered 2018-10-05: 1 via TOPICAL

## 2018-10-05 MED ORDER — SCOPOLAMINE 1 MG/3DAYS TD PT72
1.0000 | MEDICATED_PATCH | Freq: Once | TRANSDERMAL | Status: DC
  Administered 2018-10-05: 1.5 mg via TRANSDERMAL

## 2018-10-05 MED ORDER — BACITRACIN ZINC 500 UNIT/GM EX OINT
TOPICAL_OINTMENT | CUTANEOUS | Status: AC
  Filled 2018-10-05: qty 28.35

## 2018-10-05 MED ORDER — HYDROCODONE-ACETAMINOPHEN 5-300 MG PO TABS: 1.0000 | ORAL_TABLET | ORAL | 0 refills | Status: DC | PRN

## 2018-10-05 MED ORDER — OXYMETAZOLINE HCL 0.05 % NA SOLN
NASAL | Status: AC
  Filled 2018-10-05: qty 15

## 2018-10-05 MED ORDER — OXYMETAZOLINE HCL 0.05 % NA SOLN
6.0000 | Freq: Once | NASAL | Status: AC
  Administered 2018-10-05: 6 via NASAL

## 2018-10-05 MED ORDER — ONDANSETRON HCL 4 MG/2ML IJ SOLN
INTRAMUSCULAR | Status: AC
  Filled 2018-10-05: qty 2

## 2018-10-05 MED ORDER — MIDAZOLAM HCL 2 MG/2ML IJ SOLN
INTRAMUSCULAR | Status: AC
  Filled 2018-10-05: qty 2

## 2018-10-05 SURGICAL SUPPLY — 30 items
BATTERY INSTRU NAVIGATION (MISCELLANEOUS) ×12 IMPLANT
CANISTER SUCT 1200ML W/VALVE (MISCELLANEOUS) ×4 IMPLANT
CNTNR SPEC 2.5X3XGRAD LEK (MISCELLANEOUS) ×6
COAG SUCT 10F 3.5MM HAND CTRL (MISCELLANEOUS) ×4 IMPLANT
CONT SPEC 4OZ STER OR WHT (MISCELLANEOUS) ×2
CONTAINER SPEC 2.5X3XGRAD LEK (MISCELLANEOUS) ×6 IMPLANT
COVER WAND RF STERILE (DRAPES) ×3 IMPLANT
CUP MEDICINE 2OZ PLAST GRAD ST (MISCELLANEOUS) ×8 IMPLANT
DRESSING NASL FOAM PST OP SINU (MISCELLANEOUS) ×3 IMPLANT
DRSG NASAL FOAM POST OP SINU (MISCELLANEOUS) ×8
ELECT REM PT RETURN 9FT ADLT (ELECTROSURGICAL) ×4
ELECTRODE REM PT RTRN 9FT ADLT (ELECTROSURGICAL) ×3 IMPLANT
GLOVE BIO SURGEON STRL SZ7.5 (GLOVE) ×4 IMPLANT
GOWN STRL REUS W/ TWL LRG LVL3 (GOWN DISPOSABLE) ×6 IMPLANT
GOWN STRL REUS W/TWL LRG LVL3 (GOWN DISPOSABLE) ×2
LABEL OR SOLS (LABEL) ×4 IMPLANT
NS IRRIG 500ML POUR BTL (IV SOLUTION) ×4 IMPLANT
PACK HEAD/NECK (MISCELLANEOUS) ×4 IMPLANT
SOL ANTI-FOG 6CC FOG-OUT (MISCELLANEOUS) ×3 IMPLANT
SOL FOG-OUT ANTI-FOG 6CC (MISCELLANEOUS) ×1
SPLINT NASAL REUTER .5MM BIVLV (MISCELLANEOUS) ×4 IMPLANT
SPONGE NEURO XRAY DETECT 1X3 (DISPOSABLE) ×4 IMPLANT
SUT CHROMIC 3-0 (SUTURE) ×1
SUT CHROMIC 3-0 54XMFL REEL CR (SUTURE) ×3
SUT ETHILON 3-0 FS-10 30 BLK (SUTURE) ×4
SUTURE CHRMC 3-0 54XMFL REL CR (SUTURE) IMPLANT
SUTURE EHLN 3-0 FS-10 30 BLK (SUTURE) IMPLANT
SWAB CULTURE AMIES ANAERIB BLU (MISCELLANEOUS) ×3 IMPLANT
TRACKER CRANIALMASK (MASK) ×4 IMPLANT
WATER STERILE IRR 1000ML POUR (IV SOLUTION) ×4 IMPLANT

## 2018-10-05 NOTE — Discharge Instructions (Signed)

## 2018-10-05 NOTE — Transfer of Care (Signed)
Immediate Anesthesia Transfer of Care Note  Patient: Brandy Ballard  Procedure(s) Performed: IMAGE GUIDED SINUS SURGERY (N/A ) FRONTAL SINUS EXPLORATION (Bilateral ) TURBINATE REDUCTION/SUBMUCOSAL RESECTION TOTAL SPHENOIDECTOMY SEPTOPLASTY WITH TOTAL ETHMOIDECTOMY, AND MAXILLARY ANTROSTOMY WITH TISSUE REMOVAL (Bilateral )  Patient Location: PACU  Anesthesia Type:General  Level of Consciousness: drowsy and patient cooperative  Airway & Oxygen Therapy: Patient Spontanous Breathing and Patient connected to face mask oxygen  Post-op Assessment: Report given to RN and Post -op Vital signs reviewed and stable  Post vital signs: Reviewed and stable  Last Vitals:  Vitals Value Taken Time  BP 144/76 10/05/2018  9:54 AM  Temp 36.3 C 10/05/2018  9:54 AM  Pulse 115 10/05/2018  9:56 AM  Resp 19 10/05/2018  9:56 AM  SpO2 95 % 10/05/2018  9:56 AM  Vitals shown include unvalidated device data.  Last Pain:  Vitals:   10/05/18 0609  TempSrc: Tympanic  PainSc: 3          Complications: No apparent anesthesia complications

## 2018-10-05 NOTE — Anesthesia Preprocedure Evaluation (Signed)
Anesthesia Evaluation  Patient identified by MRN, date of birth, ID band Patient awake    Reviewed: Allergy & Precautions, H&P , NPO status , Patient's Chart, lab work & pertinent test results, reviewed documented beta blocker date and time   History of Anesthesia Complications (+) PONV and history of anesthetic complications  Airway Mallampati: II  TM Distance: >3 FB Neck ROM: full    Dental  (+) Teeth Intact   Pulmonary neg pulmonary ROS, Current Smoker,    Pulmonary exam normal        Cardiovascular Exercise Tolerance: Good negative cardio ROS Normal cardiovascular exam Rhythm:regular Rate:Normal     Neuro/Psych PSYCHIATRIC DISORDERS Anxiety Depression negative neurological ROS     GI/Hepatic Neg liver ROS, GERD  Medicated,  Endo/Other  negative endocrine ROS  Renal/GU negative Renal ROS  negative genitourinary   Musculoskeletal   Abdominal   Peds  Hematology  (+) Blood dyscrasia, anemia ,   Anesthesia Other Findings Past Medical History: No date: Allergy No date: Anemia No date: Anxiety     Comment:  pt denies No date: Arthritis     Comment:  lower back No date: Blood transfusion without reported diagnosis No date: Dental bridge present     Comment:  permanent lower retainer No date: Depression     Comment:  pt denies No date: GERD (gastroesophageal reflux disease) No date: Hashimoto's disease No date: PONV (postoperative nausea and vomiting)     Comment:  nausea only No date: Sinus disease No date: Thyroid disease Past Surgical History: No date: ABDOMINAL HYSTERECTOMY 07/19/2018: COLONOSCOPY WITH PROPOFOL; N/A     Comment:  Procedure: COLONOSCOPY WITH Biopsies;  Surgeon: Lucilla Lame, MD;  Location: [facility];  Service:               Endoscopy;  Laterality: N/A; No date: OVARIAN CYST REMOVAL 07/19/2018: POLYPECTOMY; N/A     Comment:  Procedure: POLYPECTOMY;  Surgeon:  Lucilla Lame, MD;                Location: [facility];  Service: Endoscopy;                Laterality: N/A; BMI    Body Mass Index:  35.23 kg/m     Reproductive/Obstetrics negative OB ROS                             Anesthesia Physical Anesthesia Plan  ASA: II  Anesthesia Plan: General ETT   Post-op Pain Management:    Induction:   PONV Risk Score and Plan:   Airway Management Planned:   Additional Equipment:   Intra-op Plan:   Post-operative Plan:   Informed Consent: I have reviewed the patients History and Physical, chart, labs and discussed the procedure including the risks, benefits and alternatives for the proposed anesthesia with the patient or authorized representative who has indicated his/her understanding and acceptance.   Dental Advisory Given  Plan Discussed with: CRNA  Anesthesia Plan Comments:         Anesthesia Quick Evaluation

## 2018-10-05 NOTE — Anesthesia Procedure Notes (Signed)
Procedure Name: Intubation Date/Time: 10/05/2018 7:32 AM Performed by: Jonna Clark, CRNA Pre-anesthesia Checklist: Patient identified, Patient being monitored, Timeout performed, Emergency Drugs available and Suction available Patient Re-evaluated:Patient Re-evaluated prior to induction Oxygen Delivery Method: Circle system utilized Preoxygenation: Pre-oxygenation with 100% oxygen Induction Type: IV induction Ventilation: Mask ventilation without difficulty Laryngoscope Size: Miller and 2 Grade View: Grade I Tube type: Oral Rae Tube size: 7.0 mm Number of attempts: 1 Placement Confirmation: ETT inserted through vocal cords under direct vision,  positive ETCO2 and breath sounds checked- equal and bilateral Secured at: 21 cm Tube secured with: Tape Dental Injury: Teeth and Oropharynx as per pre-operative assessment

## 2018-10-05 NOTE — Op Note (Signed)
10/05/2018  9:41 AM    Brandy Ballard  [id number]   Pre-Op Dx: Chronic pansinusitis,deviated septum, AND turbinate hypertrophy  Post-op Dx: SAME  Proc: #1 use of Stryker navigation system 2 hours #2 nasal septoplasty #3 bilateral endoscopic maxillary antrostomy with removal of tissue #4 bilateral endoscopic anterior and posterior ethmoidectomy with removal of tissue #5 bilateral endoscopic sphenoid sinusotomy with removal of tissue #6 bilateral frontal sinus sinusotomy with removal of tissue #7 lateral submucous resection of the inferior turbinates.  #8 mucous resection of anterior tip middle turbinates bilaterally  Surg:  Brandy Ballard  Anes:  GOT  EBL: Approximately 50 cc  Comp: None  Findings: Left septal anterior spur pus emanating from the right frontal sinus polypoid disease throughout hypertrophied inferior turbinates  Procedure: Brandy Ballard was identified in the holding area take the operating room placed in supine position.  After general endotracheal anesthesia the table was turned 90 degrees.  The Stryker navigation device was applied calibrated and remained on throughout the procedure.  The patient was then draped in usual fashion for nasal surgery cottonoid pledgets with a phenylephrine lidocaine solution was placed within each nostril.  After approximately 5 minutes this was removed.  A local anesthetic 1% lidocaine with 1 100,000 units of epinephrine was used to inject the septum the inferior turbinates and the lateral nasal wall total of 13 cc was used.  Examination of the nose showed a inferior left septal spur involving for turbinate hypertrophy.  Beginning on the right-hand side hemitransfixion incision was created to the anterior tip of the septum.  A subperichondrial plane was elevated posteriorly on the left back to the perpendicular plate of ethmoid where subperiosteal plane was elevated.  There is a large inferior spur on the left which was gently dissected from the  mucosa.  An incision was made in the anterior portion of the cartilage perpendicular care taken to leave an anterior strut prevent nasal collapse this then joined with the right perichondrium which was then elevated posteriorly on the right back to the perpendicular plate of the ethmoid as well.  A large septal spur on the left was removed as was the curvature of the septum on the left-hand side.  This allowed the nasal septal cartilage to swing back into the midline gave excellent reduction of the nasal septal deformity.  A left inferior fenestration was performed using a 15 blade to prevent hematoma.  The operation then turned to the endoscopic portion.  The 0 degree endoscope was brought into the field beginning on the left-hand side the middle turbinate was gently medialized.  There was polypoid disease emanating from the ostiomeatal region which was removed using the straight forceps the caudal elevator was then used to take down the uncinate process a curved suction was placed in the left maxillary sinus.  There was polypoid disease which was removed with the side-biting forceps were then used to open the antrostomy widely.  Using the navigation device the anterior and posterior ethmoids were gently probed and opened there was polypoid disease which was removed using the straight and  45 degree forceps.  The sphenoid sinus on the left was also entered using the navigation suction and the antrostomy was opened widely using the straight and 45 degree forceps.  The frontal sinus was then probed and cleaned with the frontal ethmoid region cleaned using the 45 degree forceps as well.  With the left side cleaned a cottonoid pledget was placed the right side was examined again the middle  turbinate was gently medialized there was a polyp emanating from the ostiomeatal region which was also removed.  The uncinate process was taken down using the caudal elevator and the sinus was entered using the curved suction.   Straight and side-biting forceps were used to open the antrostomy widely removing polypoid disease.  The Stryker navigation was then used to identify the anterior and posterior ethmoids where they were open and clean using straight and 45 degree forceps as was the sphenoid on the right.  Again there is polypoid disease throughout the frontal sinus was probed immediately there was pus which emanated from the sinus a culture was sent of this right frontal sinus pus the pus was traced superiorly up into the frontal sinus and clean using a 45 degree forceps.  Reinspection with 0 degree camera showed some small bone fragments which were removed another polypoid disease emanating from the medial aspect which was removed as well.  A cottonoid pledget was placed on the right the left side was examined with it was felt that because the middle turbinate continue to flop laterally that it needed the anterior tip needed to be excised therefore the curved scissors were used to excise anterior tip of the middle turbinate this gave excellent exposure to the ethmoid region the anterior stump of the turbinate was cauterized using the suction cautery in similar fashion the right side was reexamined that middle turbinate tube was continuing to flop laterally and a submucous resection was performed on the anterior tip and then cauterized as well.  This gave excellent exposure to the ethmoid and maxillary sinus openings to be cleaned in the clinic.  The endoscopic sinus surgery completed the operation turned the submucous resection inferior turbinates.  Beginning on the left-hand side a 15 blade was used to incise along the inferior aspect of the inferior turbinate down to the chondral bone and superiorly laterally based flap was then elevated.  The chondral bone underlying mucosa were excised using the night scissors the flap was laid back over the chondral stump and cauterized using suction cautery prevent hemostasis in similar  fashion submucous section was performed on the right.  With all edges of the submucous resection cauterized no active bleeding reinspection of the septum showed it to be midline hemitransfixion incision was closed using interrupted 3-0 chromic.  Standard female splints were placed on each side of the septum and affixed using a 3-0 nylon.  Stammberger gel was then used to fill the ethmoid maxillary region as well as beneath the inferior turbinates.  Patient was then returned to anesthesia where she was awakened in the operating room taken care of him in stable condition.  Cultures: Right frontal sinus  Specimens: Sinus contents  Dispo:   Good  Plan: Discharged home follow-up in 5 days for splint removal.  Brandy Ballard  10/05/2018 9:41 AM

## 2018-10-05 NOTE — Anesthesia Post-op Follow-up Note (Signed)
Anesthesia QCDR form completed.        

## 2018-10-06 ENCOUNTER — Ambulatory Visit: Payer: No Typology Code available for payment source | Admitting: Gastroenterology

## 2018-10-06 LAB — SURGICAL PATHOLOGY

## 2018-10-06 NOTE — Anesthesia Postprocedure Evaluation (Signed)
Anesthesia Post Note  Patient: Chalet Kerwin  Procedure(s) Performed: IMAGE GUIDED SINUS SURGERY (N/A ) FRONTAL SINUS EXPLORATION (Bilateral ) TURBINATE REDUCTION/SUBMUCOSAL RESECTION TOTAL SPHENOIDECTOMY SEPTOPLASTY WITH TOTAL ETHMOIDECTOMY, AND MAXILLARY ANTROSTOMY WITH TISSUE REMOVAL (Bilateral )  Patient location during evaluation: PACU Anesthesia Type: General Level of consciousness: awake and alert and oriented Pain management: pain level controlled Vital Signs Assessment: post-procedure vital signs reviewed and stable Respiratory status: spontaneous breathing, nonlabored ventilation and respiratory function stable Cardiovascular status: blood pressure returned to baseline and stable Postop Assessment: no signs of nausea or vomiting Anesthetic complications: no     Last Vitals:  Vitals:   10/05/18 1050 10/05/18 1156  BP: (!) 144/80 139/79  Pulse: 91 96  Resp: 16 16  Temp: 36.7 C   SpO2: 100% 95%    Last Pain:  Vitals:   10/05/18 1050  TempSrc: Temporal  PainSc: 5                  Ashwini Jago

## 2018-10-10 LAB — AEROBIC/ANAEROBIC CULTURE W GRAM STAIN (SURGICAL/DEEP WOUND): Culture: NORMAL

## 2018-10-10 LAB — AEROBIC/ANAEROBIC CULTURE (SURGICAL/DEEP WOUND)

## 2018-10-14 ENCOUNTER — Ambulatory Visit: Payer: No Typology Code available for payment source | Admitting: Gastroenterology

## 2018-10-20 ENCOUNTER — Ambulatory Visit: Payer: No Typology Code available for payment source | Admitting: Gastroenterology

## 2018-10-20 ENCOUNTER — Encounter: Payer: Self-pay | Admitting: Gastroenterology

## 2018-10-20 VITALS — BP 118/81 | HR 108 | Ht 66.0 in | Wt 221.0 lb

## 2018-10-20 DIAGNOSIS — K219 Gastro-esophageal reflux disease without esophagitis: Secondary | ICD-10-CM

## 2018-10-20 NOTE — Progress Notes (Signed)
Brandy Ballard [address]  [place],  [address]  Main: [phone]  Fax: [phone]   Gastroenterology Consultation  Referring Provider:     Paulene Floor Primary Care Physician:  Paulene Floor Reason for Consultation:    GERD        HPI:    Chief Complaint  Patient presents with  . New Patient (Initial Visit)    referral from A. Terrilee Croak, PA-C for GERD, burning left upper abdominal area    Brandy Ballard is a 53 y.o. y/o female referred for consultation & management  by Dr. Terrilee Croak, Wendee Beavers, PA-C.  Patient reports 6-4-month history of heartburn, and midepigastric to left upper quadrant burning sensation.  Patient started taking omeprazole, 40 mg daily about 5 to 6 months ago.  States this has significantly decreased the midepigastric pain but heartburn continues to 3 times a week.  Describes pain as dull, 3/10, nonradiating.  Denies any dysphagia or heartburn.  No weight loss.  No nausea or vomiting.  No altered bowel habits.  No blood in stool.  No family history of colon cancer.  Patient is interested in discussing an EGD due to due to her above symptoms.  Also reports that her father was diagnosed with stomach polyps and specifically states that he had several stomach polyps.  Denies father ever needing surgery for this.  Does not think that he had several colon polyps.  Does not know if he has had any genetic testing I will kind of polyps the stomach polyps were.  Her father lives in  [place].  She is up-to-date on her screening colonoscopy.  Last done in October 2019 with Dr. Verl Blalock and 2 subcentimeter colon polyps were removed one showed sessile serrated polyp.  Repeat recommended in 5 years.  Past Medical History:  Diagnosis Date  . Allergy   . Anemia   . Anxiety    pt denies  . Arthritis    lower back  . Blood transfusion without reported diagnosis   . Dental bridge present    permanent lower retainer  . Depression    pt  denies  . GERD (gastroesophageal reflux disease)   . Hashimoto's disease   . Polyp of nasal sinus    multiple  . PONV (postoperative nausea and vomiting)    nausea only  . Sinus disease   . Thyroid disease     Past Surgical History:  Procedure Laterality Date  . ABDOMINAL HYSTERECTOMY    . COLONOSCOPY WITH PROPOFOL N/A 07/19/2018   Procedure: COLONOSCOPY WITH Biopsies;  Surgeon: Lucilla Lame, MD;  Location: [facility];  Service: Endoscopy;  Laterality: N/A;  . FRONTAL SINUS EXPLORATION Bilateral 10/05/2018   Procedure: FRONTAL SINUS EXPLORATION;  Surgeon: Beverly Gust, MD;  Location: [facility];  Service: ENT;  Laterality: Bilateral;  . IMAGE GUIDED SINUS SURGERY N/A 10/05/2018   Procedure: IMAGE GUIDED SINUS SURGERY;  Surgeon: Beverly Gust, MD;  Location: [facility];  Service: ENT;  Laterality: N/A;  . NASAL TURBINATE REDUCTION  10/05/2018   Procedure: TURBINATE REDUCTION/SUBMUCOSAL RESECTION;  Surgeon: Beverly Gust, MD;  Location: [facility];  Service: ENT;;  . OVARIAN CYST REMOVAL    . POLYPECTOMY N/A 07/19/2018   Procedure: POLYPECTOMY;  Surgeon: Lucilla Lame, MD;  Location: [facility];  Service: Endoscopy;  Laterality: N/A;  . SEPTOPLASTY WITH ETHMOIDECTOMY, AND MAXILLARY ANTROSTOMY Bilateral 10/05/2018   Procedure: SEPTOPLASTY WITH TOTAL ETHMOIDECTOMY, AND MAXILLARY ANTROSTOMY WITH TISSUE REMOVAL;  Surgeon:  Beverly Gust, MD;  Location: [facility];  Service: ENT;  Laterality: Bilateral;  . SPHENOIDECTOMY  10/05/2018   Procedure: TOTAL SPHENOIDECTOMY;  Surgeon: Beverly Gust, MD;  Location: [facility];  Service: ENT;;    Prior to Admission medications   Medication Sig Start Date End Date Taking? Authorizing Provider  acetaminophen (TYLENOL) 500 MG tablet Take 1,000 mg by mouth daily.    Yes [provider]  atorvastatin (LIPITOR) 20 MG tablet Take 1 tablet (20 mg total) by mouth daily. 08/19/18  Yes Carles Collet M, PA-C  clindamycin (CLEOCIN) 300 MG  capsule Take 300 mg by mouth 3 (three) times daily.   Yes [provider]  estradiol (CLIMARA - DOSED IN MG/24 HR) 0.025 mg/24hr patch Place 1 patch (0.025 mg total) onto the skin once a week. 09/09/18  Yes Schuman, Christanna R, MD  ketoconazole (NIZORAL) 2 % cream Apply 1 application topically daily as needed for irritation.  09/01/17  Yes [provider]  metroNIDAZOLE (METROCREAM) 0.75 % cream Apply 1 application topically 2 (two) times daily as needed (irritation).  09/01/17  Yes [provider]  omeprazole (PRILOSEC) 40 MG capsule Take 40 mg by mouth daily.   Yes [provider]    Family History  Problem Relation Age of Onset  . Meniere's disease Mother   . Hypertension Father   . Atrial fibrillation Father   . CVA Father   . Breast cancer Paternal Grandmother 76  . Heart attack Paternal Grandfather   . CVA Paternal Grandfather   . Polycystic ovary syndrome Daughter   . Breast cancer Paternal Aunt        over 1     Social History   Tobacco Use  . Smoking status: Current Every Day Smoker    Packs/day: 0.25    Years: 20.00    Pack years: 5.00    Types: Cigarettes  . Smokeless tobacco: Never Used  . Tobacco comment: down to 2-4 daily  Substance Use Topics  . Alcohol use: Not Currently  . Drug use: Never    Allergies as of 10/20/2018 - Review Complete 10/20/2018  Allergen Reaction Noted  . Clearasil daily clear acne [benzoyl peroxide] Rash 10/05/2018    Review of Systems:    All systems reviewed and negative except where noted in HPI.   Physical Exam:  BP 118/81   Pulse (!) 108   Ht 5\' 6"  (1.676 m)   Wt 221 lb (100.2 kg)   BMI 35.67 kg/m  No LMP recorded. Patient has had a hysterectomy. Psych:  Alert and cooperative. Normal mood and affect. General:   Alert,  Well-developed, well-nourished, pleasant and cooperative in NAD Head:  Normocephalic and atraumatic. Eyes:  Sclera clear, no icterus.   Conjunctiva pink. Ears:  Normal  auditory acuity. Nose:  No deformity, discharge, or lesions. Mouth:  No deformity or lesions,oropharynx pink & moist. Neck:  Supple; no masses or thyromegaly. Abdomen:  Normal bowel sounds.  No bruits.  Soft, non-tender and non-distended without masses, hepatosplenomegaly or hernias noted.  No guarding or rebound tenderness.    Msk:  Symmetrical without gross deformities. Good, equal movement & strength bilaterally. Pulses:  Normal pulses noted. Extremities:  No clubbing or edema.  No cyanosis. Neurologic:  Alert and oriented x3;  grossly normal neurologically. Skin:  Intact without significant lesions or rashes. No jaundice. Lymph Nodes:  No significant cervical adenopathy. Psych:  Alert and cooperative. Normal mood and affect.   Labs: CBC    Component  Value Date/Time   WBC 15.6 (H) 07/10/2018 1621   RBC 4.18 07/10/2018 1621   HGB 13.2 07/10/2018 1621   HGB 14.2 06/30/2018 1059   HCT 39.5 07/10/2018 1621   HCT 41.6 06/30/2018 1059   PLT 412 (H) 07/10/2018 1621   PLT 289 06/30/2018 1059   MCV 94.5 07/10/2018 1621   MCV 92 06/30/2018 1059   MCH 31.6 07/10/2018 1621   MCHC 33.4 07/10/2018 1621   RDW 12.1 07/10/2018 1621   RDW 11.9 (L) 06/30/2018 1059   LYMPHSABS 1.9 07/10/2018 1621   LYMPHSABS 1.6 06/30/2018 1059   MONOABS 0.7 07/10/2018 1621   EOSABS 0.1 07/10/2018 1621   EOSABS 0.6 (H) 06/30/2018 1059   BASOSABS 0.1 07/10/2018 1621   BASOSABS 0.1 06/30/2018 1059   CMP     Component Value Date/Time   NA 140 07/10/2018 1621   NA 140 06/30/2018 1059   K 4.1 07/10/2018 1621   CL 106 07/10/2018 1621   CO2 26 07/10/2018 1621   GLUCOSE 120 (H) 07/10/2018 1621   BUN 17 07/10/2018 1621   BUN 12 06/30/2018 1059   CREATININE 0.50 07/10/2018 1636   CALCIUM 9.2 07/10/2018 1621   PROT 7.7 07/10/2018 1621   PROT 6.7 06/30/2018 1059   ALBUMIN 4.0 07/10/2018 1621   ALBUMIN 4.4 06/30/2018 1059   AST 22 07/10/2018 1621   ALT 28 07/10/2018 1621   ALKPHOS 58 07/10/2018 1621    BILITOT 0.4 07/10/2018 1621   BILITOT 0.2 06/30/2018 1059   GFRNONAA >60 07/10/2018 1621   GFRAA >60 07/10/2018 1621    Imaging Studies: No results found.  Assessment and Plan:   Brandy Ballard is a 53 y.o. y/o female has been referred for GERD  Her symptoms are most consistent with acid reflux I have educated her on proper use of omeprazole, 30 minutes before breakfast.  She was not taking it properly  Patient educated extensively on acid reflux lifestyle modification, including buying a bed wedge, not eating 3 hrs before bedtime, diet modifications, and handout given for the same.   She eats right before bedtime and I have encouraged her not to do so as stated above.  We will evaluate for any esophagitis or Barrett's or stomach polyps with an EGD  After lifestyle modifications, we can start decreasing her Protonix and discontinue if possible  (Risks of PPI use were discussed with patient including bone loss, C. Diff diarrhea, pneumonia, infections, CKD, electrolyte abnormalities.  If clinically possible based on symptoms, goal would be to maintain patient on the lowest dose possible, or discontinue the medication with institution of acid reflux lifestyle modifications over time. Pt. Verbalizes understanding and chooses to continue the medication.)  I have encouraged her to talk to her father about his colon polyps and try to get more information.  I have discussed with her that based on what kind of polyps these were he may need genetic testing and she might too.  She will discuss with her family and if she finds out any information, I have asked her to contact us and she verbalized understanding  I have discussed alternative options, risks & benefits,  which include, but are not limited to, bleeding, infection, perforation,respiratory complication & drug reaction.  The patient agrees with this plan & written consent will be obtained.     Dr Brandy Ballard  Speech recognition  software was used to dictate the above note.

## 2018-11-02 ENCOUNTER — Other Ambulatory Visit: Payer: Self-pay

## 2018-11-02 ENCOUNTER — Encounter: Payer: Self-pay | Admitting: *Deleted

## 2018-11-08 NOTE — Discharge Instructions (Signed)
General Anesthesia, Adult, Care After  This sheet gives you information about how to care for yourself after your procedure. Your health care provider may also give you more specific instructions. If you have problems or questions, contact your health care provider.  What can I expect after the procedure?  After the procedure, the following side effects are common:  Pain or discomfort at the IV site.  Nausea.  Vomiting.  Sore throat.  Trouble concentrating.  Feeling cold or chills.  Weak or tired.  Sleepiness and fatigue.  Soreness and body aches. These side effects can affect parts of the body that were not involved in surgery.  Follow these instructions at home:    For at least 24 hours after the procedure:  Have a responsible adult stay with you. It is important to have someone help care for you until you are awake and alert.  Rest as needed.  Do not:  Participate in activities in which you could fall or become injured.  Drive.  Use heavy machinery.  Drink alcohol.  Take sleeping pills or medicines that cause drowsiness.  Make important decisions or sign legal documents.  Take care of children on your own.  Eating and drinking  Follow any instructions from your health care provider about eating or drinking restrictions.  When you feel hungry, start by eating small amounts of foods that are soft and easy to digest (bland), such as toast. Gradually return to your regular diet.  Drink enough fluid to keep your urine pale yellow.  If you vomit, rehydrate by drinking water, juice, or clear broth.  General instructions  If you have sleep apnea, surgery and certain medicines can increase your risk for breathing problems. Follow instructions from your health care provider about wearing your sleep device:  Anytime you are sleeping, including during daytime naps.  While taking prescription pain medicines, sleeping medicines, or medicines that make you drowsy.  Return to your normal activities as told by your health care  provider. Ask your health care provider what activities are safe for you.  Take over-the-counter and prescription medicines only as told by your health care provider.  If you smoke, do not smoke without supervision.  Keep all follow-up visits as told by your health care provider. This is important.  Contact a health care provider if:  You have nausea or vomiting that does not get better with medicine.  You cannot eat or drink without vomiting.  You have pain that does not get better with medicine.  You are unable to pass urine.  You develop a skin rash.  You have a fever.  You have redness around your IV site that gets worse.  Get help right away if:  You have difficulty breathing.  You have chest pain.  You have blood in your urine or stool, or you vomit blood.  Summary  After the procedure, it is common to have a sore throat or nausea. It is also common to feel tired.  Have a responsible adult stay with you for the first 24 hours after general anesthesia. It is important to have someone help care for you until you are awake and alert.  When you feel hungry, start by eating small amounts of foods that are soft and easy to digest (bland), such as toast. Gradually return to your regular diet.  Drink enough fluid to keep your urine pale yellow.  Return to your normal activities as told by your health care provider. Ask your health care   provider what activities are safe for you.  This information is not intended to replace advice given to you by your health care provider. Make sure you discuss any questions you have with your health care provider.  Document Released: 12/22/2000 Document Revised: 05/01/2017 Document Reviewed: 05/01/2017  Elsevier Interactive Patient Education  2019 [employer]

## 2018-11-09 ENCOUNTER — Ambulatory Visit: Payer: No Typology Code available for payment source | Admitting: Anesthesiology

## 2018-11-09 ENCOUNTER — Ambulatory Visit
Admission: RE | Admit: 2018-11-09 | Discharge: 2018-11-09 | Disposition: A | Discharge status: Home / Self Care | Payer: No Typology Code available for payment source | Attending: Gastroenterology | Admitting: Gastroenterology

## 2018-11-09 ENCOUNTER — Encounter
Admission: RE | Disposition: A | Discharge status: Home / Self Care | Payer: Self-pay | Source: Home / Self Care | Attending: Gastroenterology

## 2018-11-09 DIAGNOSIS — M1990 Unspecified osteoarthritis, unspecified site: Secondary | ICD-10-CM | POA: Insufficient documentation

## 2018-11-09 DIAGNOSIS — Z7951 Long term (current) use of inhaled steroids: Secondary | ICD-10-CM | POA: Diagnosis not present

## 2018-11-09 DIAGNOSIS — R1013 Epigastric pain: Secondary | ICD-10-CM

## 2018-11-09 DIAGNOSIS — Z888 Allergy status to other drugs, medicaments and biological substances status: Secondary | ICD-10-CM | POA: Diagnosis not present

## 2018-11-09 DIAGNOSIS — E063 Autoimmune thyroiditis: Secondary | ICD-10-CM | POA: Diagnosis not present

## 2018-11-09 DIAGNOSIS — K2950 Unspecified chronic gastritis without bleeding: Secondary | ICD-10-CM | POA: Insufficient documentation

## 2018-11-09 DIAGNOSIS — K219 Gastro-esophageal reflux disease without esophagitis: Secondary | ICD-10-CM | POA: Insufficient documentation

## 2018-11-09 DIAGNOSIS — K317 Polyp of stomach and duodenum: Secondary | ICD-10-CM | POA: Diagnosis not present

## 2018-11-09 DIAGNOSIS — Z79899 Other long term (current) drug therapy: Secondary | ICD-10-CM | POA: Insufficient documentation

## 2018-11-09 DIAGNOSIS — Z8249 Family history of ischemic heart disease and other diseases of the circulatory system: Secondary | ICD-10-CM | POA: Insufficient documentation

## 2018-11-09 DIAGNOSIS — F17210 Nicotine dependence, cigarettes, uncomplicated: Secondary | ICD-10-CM | POA: Diagnosis not present

## 2018-11-09 HISTORY — PX: ESOPHAGOGASTRODUODENOSCOPY (EGD) WITH PROPOFOL: SHX5813

## 2018-11-09 SURGERY — ESOPHAGOGASTRODUODENOSCOPY (EGD) WITH PROPOFOL
Anesthesia: General | Site: Throat

## 2018-11-09 MED ORDER — LIDOCAINE HCL (CARDIAC) PF 100 MG/5ML IV SOSY
PREFILLED_SYRINGE | INTRAVENOUS | Status: DC | PRN
  Administered 2018-11-09: 40 mg via INTRAVENOUS

## 2018-11-09 MED ORDER — SODIUM CHLORIDE 0.9 % IV SOLN: INTRAVENOUS | Status: DC

## 2018-11-09 MED ORDER — ONDANSETRON HCL 4 MG/2ML IJ SOLN: 4.0000 mg | Freq: Once | INTRAMUSCULAR | Status: DC

## 2018-11-09 MED ORDER — PROPOFOL 10 MG/ML IV BOLUS
INTRAVENOUS | Status: DC | PRN
  Administered 2018-11-09 (×2): 20 mg via INTRAVENOUS
  Administered 2018-11-09: 30 mg via INTRAVENOUS
  Administered 2018-11-09 (×3): 20 mg via INTRAVENOUS
  Administered 2018-11-09: 80 mg via INTRAVENOUS
  Administered 2018-11-09: 20 mg via INTRAVENOUS
  Administered 2018-11-09: 10 mg via INTRAVENOUS

## 2018-11-09 MED ORDER — LACTATED RINGERS IV SOLN: INTRAVENOUS | Status: DC

## 2018-11-09 MED ORDER — GLYCOPYRROLATE 0.2 MG/ML IJ SOLN
INTRAMUSCULAR | Status: DC | PRN
  Administered 2018-11-09: 0.2 mg via INTRAVENOUS

## 2018-11-09 MED ORDER — STERILE WATER FOR IRRIGATION IR SOLN
Status: DC | PRN
  Administered 2018-11-09: 10:00:00

## 2018-11-09 MED ORDER — LACTATED RINGERS IV SOLN
INTRAVENOUS | Status: DC
  Administered 2018-11-09: 10:00:00 via INTRAVENOUS

## 2018-11-09 SURGICAL SUPPLY — 7 items
BLOCK BITE 60FR ADLT L/F GRN (MISCELLANEOUS) ×2 IMPLANT
CANISTER SUCT 1200ML W/VALVE (MISCELLANEOUS) ×2 IMPLANT
FORCEPS BIOP RAD 4 LRG CAP 4 (CUTTING FORCEPS) ×2 IMPLANT
GOWN CVR UNV OPN BCK APRN NK (MISCELLANEOUS) ×2 IMPLANT
GOWN ISOL THUMB LOOP REG UNIV (MISCELLANEOUS) ×2
KIT ENDO PROCEDURE OLY (KITS) ×2 IMPLANT
WATER STERILE IRR 250ML POUR (IV SOLUTION) ×2 IMPLANT

## 2018-11-09 NOTE — H&P (Signed)
Vonda Antigua, MD [address] [place], [place], [place], [address] [address], [place], [place], [place], [address] Phone: [phone]  Fax: [phone]  Primary Care Physician:  Trinna Post, PA-C   Pre-Procedure History & Physical: HPI:  Brandy Ballard is a 53 y.o. female is here for an EGD.   Past Medical History:  Diagnosis Date  . Allergy   . Anemia   . Anxiety    pt denies  . Arthritis    lower back  . Blood transfusion without reported diagnosis   . Dental bridge present    permanent lower retainer  . Depression    pt denies  . GERD (gastroesophageal reflux disease)   . Hashimoto's disease   . Polyp of nasal sinus    multiple  . PONV (postoperative nausea and vomiting)    nausea only  . Sinus disease   . Thyroid disease     Past Surgical History:  Procedure Laterality Date  . ABDOMINAL HYSTERECTOMY    . COLONOSCOPY WITH PROPOFOL N/A 07/19/2018   Procedure: COLONOSCOPY WITH Biopsies;  Surgeon: Lucilla Lame, MD;  Location: [facility];  Service: Endoscopy;  Laterality: N/A;  . FRONTAL SINUS EXPLORATION Bilateral 10/05/2018   Procedure: FRONTAL SINUS EXPLORATION;  Surgeon: Beverly Gust, MD;  Location: [facility];  Service: ENT;  Laterality: Bilateral;  . IMAGE GUIDED SINUS SURGERY N/A 10/05/2018   Procedure: IMAGE GUIDED SINUS SURGERY;  Surgeon: Beverly Gust, MD;  Location: [facility];  Service: ENT;  Laterality: N/A;  . NASAL TURBINATE REDUCTION  10/05/2018   Procedure: TURBINATE REDUCTION/SUBMUCOSAL RESECTION;  Surgeon: Beverly Gust, MD;  Location: [facility];  Service: ENT;;  . OVARIAN CYST REMOVAL    . POLYPECTOMY N/A 07/19/2018   Procedure: POLYPECTOMY;  Surgeon: Lucilla Lame, MD;  Location: [facility];  Service: Endoscopy;  Laterality: N/A;  . SEPTOPLASTY WITH ETHMOIDECTOMY, AND MAXILLARY ANTROSTOMY Bilateral 10/05/2018   Procedure: SEPTOPLASTY WITH TOTAL ETHMOIDECTOMY, AND MAXILLARY ANTROSTOMY WITH TISSUE REMOVAL;   Surgeon: Beverly Gust, MD;  Location: [facility];  Service: ENT;  Laterality: Bilateral;  . SPHENOIDECTOMY  10/05/2018   Procedure: TOTAL SPHENOIDECTOMY;  Surgeon: Beverly Gust, MD;  Location: [facility];  Service: ENT;;    Prior to Admission medications   Medication Sig Start Date End Date Taking? Authorizing Provider  acetaminophen (TYLENOL) 500 MG tablet Take 1,000 mg by mouth daily.    Yes [provider]  atorvastatin (LIPITOR) 20 MG tablet Take 1 tablet (20 mg total) by mouth daily. 08/19/18  Yes Trinna Post, PA-C  cetirizine (ZYRTEC) 10 MG tablet Take 10 mg by mouth daily.   Yes [provider]  estradiol (CLIMARA - DOSED IN MG/24 HR) 0.025 mg/24hr patch Place 1 patch (0.025 mg total) onto the skin once a week. 09/09/18  Yes Schuman, Stefanie Libel, MD  Fluticasone Propionate (XHANCE) 93 MCG/ACT Pax into the nose daily as needed.   Yes [provider]  ketoconazole (NIZORAL) 2 % cream Apply 1 application topically daily as needed for irritation.  09/01/17  Yes [provider]  metroNIDAZOLE (METROCREAM) 0.75 % cream Apply 1 application topically 2 (two) times daily as needed (irritation).  09/01/17  Yes [provider]  omeprazole (PRILOSEC) 40 MG capsule Take 40 mg by mouth daily.   Yes [provider]    Allergies as of 10/20/2018 - Review Complete 10/20/2018  Allergen Reaction Noted  . Clearasil daily clear acne [benzoyl peroxide] Rash 10/05/2018    Family History  Problem Relation Age of Onset  . Meniere's disease Mother   . Hypertension Father   . Atrial fibrillation Father   . CVA Father   . Breast cancer Paternal Grandmother 8  . Heart attack Paternal Grandfather   . CVA Paternal Grandfather   . Polycystic ovary syndrome Daughter   . Breast cancer Paternal Aunt        over 56    Social History   Socioeconomic History  . Marital status: Married    Spouse name: Not on file  . Number of children: Not  on file  . Years of education: Not on file  . Highest education level: Not on file  Occupational History  . Not on file  Social Needs  . Financial resource strain: Not on file  . Food insecurity:    Worry: Not on file    Inability: Not on file  . Transportation needs:    Medical: Not on file    Non-medical: Not on file  Tobacco Use  . Smoking status: Current Every Day Smoker    Packs/day: 0.25    Years: 20.00    Pack years: 5.00    Types: Cigarettes  . Smokeless tobacco: Never Used  . Tobacco comment: down to 2-4 daily  Substance and Sexual Activity  . Alcohol use: Not Currently  . Drug use: Never  . Sexual activity: Yes    Birth control/protection: Surgical  Lifestyle  . Physical activity:    Days per week: Not on file    Minutes per session: Not on file  . Stress: Not on file  Relationships  . Social connections:    Talks on phone: Not on file    Gets together: Not on file    Attends religious service: Not on file    Active member of club or organization: Not on file    Attends meetings of clubs or organizations: Not on file    Relationship status: Not on file  . Intimate partner violence:    Fear of current or ex partner: Not on file    Emotionally abused: Not on file    Physically abused: Not on file    Forced sexual activity: Not on file  Other Topics Concern  . Not on file  Social History Narrative  . Not on file    Review of Systems: See HPI, otherwise negative ROS  Physical Exam: BP 114/75   Pulse 88   Temp (!) 97 F (36.1 C) (Temporal)   Resp 16   Ht 5\' 6"  (1.676 m)   Wt 100.7 kg   SpO2 99%   BMI 35.83 kg/m  General:   Alert,  pleasant and cooperative in NAD Head:  Normocephalic and atraumatic. Neck:  Supple; no masses or thyromegaly. Lungs:  Clear throughout to auscultation, normal respiratory effort.    Heart:  +S1, +S2, Regular rate and rhythm, No edema. Abdomen:  Soft, nontender and nondistended. Normal bowel sounds, without guarding,  and without rebound.   Neurologic:  Alert and  oriented x4;  grossly normal neurologically.  Impression/Plan: Brandy Ballard is here for an EGD for Acid Reflux, abdominal pain.  Risks, benefits, limitations, and alternatives regarding the procedure have been reviewed with the patient.  Questions have been answered.  All parties agreeable.   Virgel Manifold, MD  11/09/2018, 10:02 AM

## 2018-11-09 NOTE — Anesthesia Procedure Notes (Signed)
Performed by: Kiaira Pointer, CRNA Pre-anesthesia Checklist: Patient identified, Emergency Drugs available, Suction available, Timeout performed and Patient being monitored Patient Re-evaluated:Patient Re-evaluated prior to induction Oxygen Delivery Method: Nasal cannula Placement Confirmation: positive ETCO2       

## 2018-11-09 NOTE — Op Note (Signed)
[facility] Patient Name: Brandy Ballard Procedure Date: 11/09/2018 10:01 AM MRN: [id number] Account #: [id number] Date of Birth: [birth year] Admit Type: Outpatient Age: 53 Room: [facility] OR ROOM 01 Gender: Female Note Status: Finalized Procedure:            Upper GI endoscopy Indications:          Epigastric abdominal pain, Suspected esophageal reflux Providers:            Varnita B. Bonna Gains MD, MD Referring MD:         Wendee Beavers. Terrilee Croak (Referring MD) Medicines:            Monitored Anesthesia Care Complications:        No immediate complications. Procedure:            Pre-Anesthesia Assessment:                       - Prior to the procedure, a History and Physical was                        performed, and patient medications, allergies and                        sensitivities were reviewed. The patient's tolerance of                        previous anesthesia was reviewed.                       - The risks and benefits of the procedure and the                        sedation options and risks were discussed with the                        patient. All questions were answered and informed                        consent was obtained.                       - Patient identification and proposed procedure were                        verified prior to the procedure by the physician, the                        nurse, the anesthesiologist, the anesthetist and the                        technician. The procedure was verified in the procedure                        room.                       - ASA Grade Assessment: II - A patient with mild                        systemic disease.  After obtaining informed consent, the endoscope was                        passed under direct vision. Throughout the procedure,                        the patient's blood pressure, pulse, and oxygen                        saturations were monitored  continuously. The Endoscope                        was introduced through the mouth, and advanced to the                        second part of duodenum. The upper GI endoscopy was                        accomplished with ease. The patient tolerated the                        procedure well. Findings:      The examined esophagus was normal.      Patchy mildly erythematous mucosa without bleeding was found in the       gastric antrum. Biopsies were taken with a cold forceps for histology.       Biopsies were obtained in the gastric body, at the incisura and in the       gastric antrum with cold forceps for histology.      The duodenal bulb, second portion of the duodenum and examined duodenum       were normal.      4-5 2 to 5 mm sessile polyps with no bleeding and no stigmata of recent       bleeding were found in the gastric fundus and in the gastric body.       Biopsies were taken with a cold forceps for histology. Impression:           - Normal esophagus.                       - Erythematous mucosa in the antrum. Biopsied.                       - Normal duodenal bulb, second portion of the duodenum                        and examined duodenum.                       - 4-5 gastric polyps. Biopsied.                       - Biopsies were obtained in the gastric body, at the                        incisura and in the gastric antrum. Recommendation:       - Await pathology results.                       - Discharge patient to home (with escort).                       -  Advance diet as tolerated.                       - Continue present medications.                       - Patient has a contact number available for                        emergencies. The signs and symptoms of potential                        delayed complications were discussed with the patient.                        Return to normal activities tomorrow. Written discharge                        instructions were provided  to the patient.                       - Discharge patient to home (with escort).                       - The findings and recommendations were discussed with                        the patient.                       - The findings and recommendations were discussed with                        the patient's family.                       - Follow an antireflux regimen. Procedure Code(s):    --- Professional ---                       [phone], Esophagogastroduodenoscopy, flexible, transoral;                        with biopsy, single or multiple Diagnosis Code(s):    --- Professional ---                       K31.89, Other diseases of stomach and duodenum                       K31.7, Polyp of stomach and duodenum                       R10.13, Epigastric pain CPT copyright 2018 American Medical Association. All rights reserved. The codes documented in this report are preliminary and upon coder review may  be revised to meet current compliance requirements.  Vonda Antigua, MD Margretta Sidle B. Bonna Gains MD, MD 11/09/2018 10:29:02 AM This report has been signed electronically. Number of Addenda: 0 Note Initiated On: 11/09/2018 10:01 AM Total Procedure Duration: 0 hours 7 minutes 49 seconds  Estimated Blood Loss: Estimated blood loss: none.      [facility]

## 2018-11-09 NOTE — Transfer of Care (Signed)
Immediate Anesthesia Transfer of Care Note  Patient: Brandy Ballard  Procedure(s) Performed: ESOPHAGOGASTRODUODENOSCOPY (EGD) WITH BIOPSIES (N/A Throat)  Patient Location: PACU  Anesthesia Type: General  Level of Consciousness: awake, alert  and patient cooperative  Airway and Oxygen Therapy: Patient Spontanous Breathing and Patient connected to supplemental oxygen  Post-op Assessment: Post-op Vital signs reviewed, Patient's Cardiovascular Status Stable, Respiratory Function Stable, Patent Airway and No signs of Nausea or vomiting  Post-op Vital Signs: Reviewed and stable  Complications: No apparent anesthesia complications

## 2018-11-09 NOTE — Anesthesia Postprocedure Evaluation (Signed)
Anesthesia Post Note  Patient: Brandy Ballard  Procedure(s) Performed: ESOPHAGOGASTRODUODENOSCOPY (EGD) WITH BIOPSIES (N/A Throat)  Patient location during evaluation: PACU Anesthesia Type: General Level of consciousness: awake and alert Pain management: pain level controlled Vital Signs Assessment: post-procedure vital signs reviewed and stable Respiratory status: spontaneous breathing Cardiovascular status: stable Anesthetic complications: no    Brandy Ballard, Brandy Ballard,  Brandy Ballard

## 2018-11-09 NOTE — Anesthesia Preprocedure Evaluation (Signed)
Anesthesia Evaluation  Patient identified by MRN, date of birth, ID band Patient awake    Reviewed: Allergy & Precautions, H&P , NPO status , Patient's Chart, lab work & pertinent test results  History of Anesthesia Complications (+) PONV and history of anesthetic complications  Airway Mallampati: I  TM Distance: >3 FB Neck ROM: full    Dental no notable dental hx.  Permanent retainer on bottom.:   Pulmonary neg pulmonary ROS, Current Smoker,    Pulmonary exam normal breath sounds clear to auscultation       Cardiovascular negative cardio ROS Normal cardiovascular exam Rhythm:regular Rate:Normal     Neuro/Psych negative neurological ROS     GI/Hepatic Neg liver ROS, Medicated,  Endo/Other  negative endocrine ROS  Renal/GU negative Renal ROS  negative genitourinary   Musculoskeletal   Abdominal   Peds  Hematology  (+) Blood dyscrasia, anemia ,   Anesthesia Other Findings   Reproductive/Obstetrics                             Anesthesia Physical Anesthesia Plan  ASA: II  Anesthesia Plan: General   Post-op Pain Management:    Induction:   PONV Risk Score and Plan:   Airway Management Planned:   Additional Equipment:   Intra-op Plan:   Post-operative Plan:   Informed Consent: I have reviewed the patients History and Physical, chart, labs and discussed the procedure including the risks, benefits and alternatives for the proposed anesthesia with the patient or authorized representative who has indicated his/her understanding and acceptance.       Plan Discussed with:   Anesthesia Plan Comments:         Anesthesia Quick Evaluation

## 2018-11-12 LAB — SURGICAL PATHOLOGY

## 2018-11-16 ENCOUNTER — Encounter: Payer: Self-pay | Admitting: Gastroenterology

## 2019-01-02 ENCOUNTER — Emergency Department
Admission: EM | Admit: 2019-01-02 | Discharge: 2019-01-02 | Disposition: A | Discharge status: Home / Self Care | Payer: No Typology Code available for payment source | Attending: Student in an Organized Health Care Education/Training Program | Admitting: Student in an Organized Health Care Education/Training Program

## 2019-01-02 ENCOUNTER — Encounter: Payer: Self-pay | Admitting: Emergency Medicine

## 2019-01-02 ENCOUNTER — Emergency Department: Payer: No Typology Code available for payment source

## 2019-01-02 ENCOUNTER — Other Ambulatory Visit: Payer: Self-pay

## 2019-01-02 DIAGNOSIS — F17210 Nicotine dependence, cigarettes, uncomplicated: Secondary | ICD-10-CM | POA: Insufficient documentation

## 2019-01-02 DIAGNOSIS — R69 Illness, unspecified: Secondary | ICD-10-CM

## 2019-01-02 DIAGNOSIS — J111 Influenza due to unidentified influenza virus with other respiratory manifestations: Secondary | ICD-10-CM | POA: Diagnosis not present

## 2019-01-02 DIAGNOSIS — Z79899 Other long term (current) drug therapy: Secondary | ICD-10-CM | POA: Diagnosis not present

## 2019-01-02 DIAGNOSIS — Z20828 Contact with and (suspected) exposure to other viral communicable diseases: Secondary | ICD-10-CM | POA: Insufficient documentation

## 2019-01-02 DIAGNOSIS — R05 Cough: Secondary | ICD-10-CM | POA: Diagnosis present

## 2019-01-02 LAB — COMPREHENSIVE METABOLIC PANEL
ALT: 50 U/L — ABNORMAL HIGH (ref 0–44)
AST: 26 U/L (ref 15–41)
Albumin: 4.2 g/dL (ref 3.5–5.0)
Alkaline Phosphatase: 69 U/L (ref 38–126)
Anion gap: 8 (ref 5–15)
BUN: 16 mg/dL (ref 6–20)
CO2: 26 mmol/L (ref 22–32)
Calcium: 9 mg/dL (ref 8.9–10.3)
Chloride: 105 mmol/L (ref 98–111)
Creatinine, Ser: 0.6 mg/dL (ref 0.44–1.00)
GFR calc Af Amer: >60 mL/min (ref >60)
GFR calc non Af Amer: >60 mL/min (ref >60)
Glucose, Bld: 142 mg/dL — ABNORMAL HIGH (ref 70–99)
Potassium: 3.6 mmol/L (ref 3.5–5.1)
Sodium: 139 mmol/L (ref 135–145)
Total Bilirubin: 0.2 mg/dL — ABNORMAL LOW (ref 0.3–1.2)
Total Protein: 7.7 g/dL (ref 6.5–8.1)

## 2019-01-02 LAB — CBC WITH DIFFERENTIAL/PLATELET
Abs Immature Granulocytes: 0.01 10*3/uL (ref 0.00–0.07)
Basophils Absolute: 0.1 10*3/uL (ref 0.0–0.1)
Basophils Relative: 1 %
Eosinophils Absolute: 0.7 10*3/uL — ABNORMAL HIGH (ref 0.0–0.5)
Eosinophils Relative: 9 %
HCT: 43.7 % (ref 36.0–46.0)
Hemoglobin: 14.7 g/dL (ref 12.0–15.0)
Immature Granulocytes: 0 %
Lymphocytes Relative: 21 %
Lymphs Abs: 1.6 10*3/uL (ref 0.7–4.0)
MCH: 30.6 pg (ref 26.0–34.0)
MCHC: 33.6 g/dL (ref 30.0–36.0)
MCV: 91 fL (ref 80.0–100.0)
Monocytes Absolute: 0.5 10*3/uL (ref 0.1–1.0)
Monocytes Relative: 7 %
Neutro Abs: 4.6 10*3/uL (ref 1.7–7.7)
Neutrophils Relative %: 62 %
Platelets: 319 10*3/uL (ref 150–400)
RBC: 4.8 MIL/uL (ref 3.87–5.11)
RDW: 12.3 % (ref 11.5–15.5)
WBC: 7.5 10*3/uL (ref 4.0–10.5)
nRBC: 0 % (ref 0.0–0.2)

## 2019-01-02 LAB — INFLUENZA PANEL BY PCR (TYPE A & B)
Influenza A By PCR: NEGATIVE
Influenza B By PCR: NEGATIVE

## 2019-01-02 MED ORDER — SODIUM CHLORIDE 0.9 % IV BOLUS
1000.0000 mL | Freq: Once | INTRAVENOUS | Status: AC
  Administered 2019-01-02: 1000 mL via INTRAVENOUS

## 2019-01-02 NOTE — ED Triage Notes (Signed)
Pt presents to ED c/o cough, chest tightness, and fever since Friday with diarrhea starting yesterday.

## 2019-01-02 NOTE — ED Provider Notes (Signed)
[facility] Emergency Department Provider Note    First MD Initiated Contact with Patient 01/02/19 1647     (approximate)  I have reviewed the triage vital signs and the nursing notes.   HISTORY  Chief Complaint Cough    HPI Brandy Ballard is a 53 y.o. female close past medical history presents the ER for cough chest tightness and fevers at home associated with some diarrhea.  Symptoms started Friday.  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  Not been on any antibiotics.  Does have a history of bronchitis.  Does still smoke a few cigarettes but has cut down.   Past Medical History:  Diagnosis Date  . Allergy   . Anemia   . Anxiety    pt denies  . Arthritis    lower back  . Blood transfusion without reported diagnosis   . Dental bridge present    permanent lower retainer  . Depression    pt denies  . GERD (gastroesophageal reflux disease)   . Hashimoto's disease   . Polyp of nasal sinus    multiple  . PONV (postoperative nausea and vomiting)    nausea only  . Sinus disease   . Thyroid disease    Family History  Problem Relation Age of Onset  . Meniere's disease Mother   . Hypertension Father   . Atrial fibrillation Father   . CVA Father   . Breast cancer Paternal Grandmother 54  . Heart attack Paternal Grandfather   . CVA Paternal Grandfather   . Polycystic ovary syndrome Daughter   . Breast cancer Paternal Aunt        over 25   Past Surgical History:  Procedure Laterality Date  . ABDOMINAL HYSTERECTOMY    . COLONOSCOPY WITH PROPOFOL N/A 07/19/2018   Procedure: COLONOSCOPY WITH Biopsies;  Surgeon: Lucilla Lame, MD;  Location: [place];  Service: Endoscopy;  Laterality: N/A;  . ESOPHAGOGASTRODUODENOSCOPY (EGD) WITH PROPOFOL N/A 11/09/2018   Procedure: ESOPHAGOGASTRODUODENOSCOPY (EGD) WITH BIOPSIES;  Surgeon: Virgel Manifold, MD;  Location:  [place];  Service: Endoscopy;  Laterality: N/A;  . FRONTAL SINUS EXPLORATION Bilateral 10/05/2018   Procedure: FRONTAL SINUS EXPLORATION;  Surgeon: Beverly Gust, MD;  Location: [facility];  Service: ENT;  Laterality: Bilateral;  . IMAGE GUIDED SINUS SURGERY N/A 10/05/2018   Procedure: IMAGE GUIDED SINUS SURGERY;  Surgeon: Beverly Gust, MD;  Location: [facility];  Service: ENT;  Laterality: N/A;  . NASAL TURBINATE REDUCTION  10/05/2018   Procedure: TURBINATE REDUCTION/SUBMUCOSAL RESECTION;  Surgeon: Beverly Gust, MD;  Location: [facility];  Service: ENT;;  . OVARIAN CYST REMOVAL    . POLYPECTOMY N/A 07/19/2018   Procedure: POLYPECTOMY;  Surgeon: Lucilla Lame, MD;  Location: [place];  Service: Endoscopy;  Laterality: N/A;  . SEPTOPLASTY WITH ETHMOIDECTOMY, AND MAXILLARY ANTROSTOMY Bilateral 10/05/2018   Procedure: SEPTOPLASTY WITH TOTAL ETHMOIDECTOMY, AND MAXILLARY ANTROSTOMY WITH TISSUE REMOVAL;  Surgeon: Beverly Gust, MD;  Location: [facility];  Service: ENT;  Laterality: Bilateral;  . SPHENOIDECTOMY  10/05/2018   Procedure: TOTAL SPHENOIDECTOMY;  Surgeon: Beverly Gust, MD;  Location: [facility];  Service: ENT;;   Patient Active Problem List   Diagnosis Date Noted  . Gastroesophageal reflux disease   . Gastric polyp   . Abdominal pain, epigastric   . Tobacco abuse 08/18/2018  . Encounter for screening colonoscopy   . Benign neoplasm of cecum   .  Polyp of sigmoid colon       Prior to Admission medications   Medication Sig Start Date End Date Taking? Authorizing Provider  acetaminophen (TYLENOL) 500 MG tablet Take 1,000 mg by mouth daily.     [provider]  atorvastatin (LIPITOR) 20 MG tablet Take 1 tablet (20 mg total) by mouth daily. 08/19/18   Trinna Post, PA-C  cetirizine (ZYRTEC) 10 MG tablet Take 10 mg by mouth daily.    [provider]  estradiol (CLIMARA - DOSED IN MG/24 HR) 0.025 mg/24hr patch Place 1 patch (0.025 mg total)  onto the skin once a week. 09/09/18   Schuman, Stefanie Libel, MD  Fluticasone Propionate (XHANCE) 93 MCG/ACT [place] into the nose daily as needed.    [provider]  ketoconazole (NIZORAL) 2 % cream Apply 1 application topically daily as needed for irritation.  09/01/17   [provider]  metroNIDAZOLE (METROCREAM) 0.75 % cream Apply 1 application topically 2 (two) times daily as needed (irritation).  09/01/17   [provider]  omeprazole (PRILOSEC) 40 MG capsule Take 40 mg by mouth daily.    [provider]    Allergies Nsaids and Clearasil daily clear acne [benzoyl peroxide]    Social History Social History   Tobacco Use  . Smoking status: Current Every Day Smoker    Packs/day: 0.25    Years: 20.00    Pack years: 5.00    Types: Cigarettes  . Smokeless tobacco: Never Used  . Tobacco comment: down to 2-4 daily  Substance Use Topics  . Alcohol use: Not Currently  . Drug use: Never    Review of Systems Patient denies headaches, rhinorrhea, blurry vision, numbness, shortness of breath, chest pain, edema, cough, abdominal pain, nausea, vomiting, diarrhea, dysuria, fevers, rashes or hallucinations unless otherwise stated above in HPI. ____________________________________________   PHYSICAL EXAM:  VITAL SIGNS: Vitals:   01/02/19 1600 01/02/19 1837  BP: (!) 152/98 121/83  Pulse: (!) 109 83  Resp: 18 18  Temp: 98.8 F (37.1 C)   SpO2: 95% 100%    Constitutional: Alert and oriented.  Eyes: Conjunctivae are normal.  Head: Atraumatic. Nose: No congestion/rhinnorhea. Mouth/Throat: Mucous membranes are moist.   Neck: No stridor. Painless ROM.  Cardiovascular: Normal rate, regular rhythm. Grossly normal heart sounds.  Good peripheral circulation. Respiratory: Normal respiratory effort.  No retractions. Lungs CTAB. Gastrointestinal: Soft and nontender. No distention. No abdominal bruits. No CVA tenderness. Genitourinary:   Musculoskeletal: No lower extremity tenderness nor edema.  No joint effusions. Neurologic:  Normal speech and language. No gross focal neurologic deficits are appreciated. No facial droop Skin:  Skin is warm, dry and intact. No rash noted. Psychiatric: Mood and affect are normal. Speech and behavior are normal.  ____________________________________________   LABS (all labs ordered are listed, but only abnormal results are displayed)  Results for orders placed or performed during the hospital encounter of 01/02/19 (from the past 24 hour(s))  CBC with Differential/Platelet     Status: Abnormal   Collection Time: 01/02/19  5:13 PM  Result Value Ref Range   WBC 7.5 4.0 - 10.5 K/uL   RBC 4.80 3.87 - 5.11 MIL/uL   Hemoglobin 14.7 12.0 - 15.0 g/dL   HCT 43.7 36.0 - 46.0 %   MCV 91.0 80.0 - 100.0 fL   MCH 30.6 26.0 - 34.0 pg   MCHC 33.6 30.0 - 36.0 g/dL   RDW 12.3 11.5 - 15.5 %   Platelets 319 150 -  400 K/uL   nRBC 0.0 0.0 - 0.2 %   Neutrophils Relative % 62 %   Neutro Abs 4.6 1.7 - 7.7 K/uL   Lymphocytes Relative 21 %   Lymphs Abs 1.6 0.7 - 4.0 K/uL   Monocytes Relative 7 %   Monocytes Absolute 0.5 0.1 - 1.0 K/uL   Eosinophils Relative 9 %   Eosinophils Absolute 0.7 (H) 0.0 - 0.5 K/uL   Basophils Relative 1 %   Basophils Absolute 0.1 0.0 - 0.1 K/uL   Immature Granulocytes 0 %   Abs Immature Granulocytes 0.01 0.00 - 0.07 K/uL  Comprehensive metabolic panel     Status: Abnormal   Collection Time: 01/02/19  5:13 PM  Result Value Ref Range   Sodium 139 135 - 145 mmol/L   Potassium 3.6 3.5 - 5.1 mmol/L   Chloride 105 98 - 111 mmol/L   CO2 26 22 - 32 mmol/L   Glucose, Bld 142 (H) 70 - 99 mg/dL   BUN 16 6 - 20 mg/dL   Creatinine, Ser 0.60 0.44 - 1.00 mg/dL   Calcium 9.0 8.9 - 10.3 mg/dL   Total Protein 7.7 6.5 - 8.1 g/dL   Albumin 4.2 3.5 - 5.0 g/dL   AST 26 15 - 41 U/L   ALT 50 (H) 0 - 44 U/L   Alkaline Phosphatase 69 38 - 126 U/L   Total Bilirubin 0.2 (L) 0.3 - 1.2 mg/dL    GFR calc non Af Amer >60 >60 mL/min   GFR calc Af Amer >60 >60 mL/min   Anion gap 8 5 - 15  Influenza panel by PCR (type A & B)     Status: None   Collection Time: 01/02/19  5:13 PM  Result Value Ref Range   Influenza A By PCR NEGATIVE NEGATIVE   Influenza B By PCR NEGATIVE NEGATIVE   ____________________________________________  ____________________________________________  RADIOLOGY  I personally reviewed all radiographic images ordered to evaluate for the above acute complaints and reviewed radiology reports and findings.  These findings were personally discussed with the patient.  Please see medical record for radiology report.  ____________________________________________   PROCEDURES  Procedure(s) performed:  Procedures    Critical Care performed: no ____________________________________________   INITIAL IMPRESSION / ASSESSMENT AND PLAN / ED COURSE  Pertinent labs & imaging results that were available during my care of the patient were reviewed by me and considered in my medical decision making (see chart for details).   DDX: uri, ili, pna, bronchitis, covid 19, dehydration, electrolyte abn  Brandy Ballard is a 53 y.o. who presents to the ED with symptoms as described above.  She is well-appearing and nontoxic.  Blood will be sent for blood differential.  Will provide IV fluids for component of possible dehydration given her diarrhea.  Will send for flu as well as COVID-19 given her exposures.  Clinical Course as of Jan 02 1931  Sun Jan 02, 2019  1813 Patient reassessed.  Remains hemodynamically stable.  Tolerating oral hydration.  Blood work is reassuring.  No evidence of consolidation flu negative.  Do not feel patient requires hospitalization.  COVID-19 testing still pending.  I have recommended continued albuterol inhaler use given her cough.  Discussed supportive care. Have discussed with the patient and available family all diagnostics and treatments performed thus  far and all questions were answered to the best of my ability. The patient demonstrates understanding and agreement with plan.    [PR]    Clinical Course User Index [  PR] Merlyn Lot, MD     The patient was evaluated in Emergency Department today for the symptoms described in the history of present illness. He/she was evaluated in the context of the global COVID-19 pandemic, which necessitated consideration that the patient might be at risk for infection with the SARS-CoV-2 virus that causes COVID-19. Institutional protocols and algorithms that pertain to the evaluation of patients at risk for COVID-19 are in a state of rapid change based on information released by regulatory bodies including the CDC and federal and state organizations. These policies and algorithms were followed during the patient's care in the ED.   As part of my medical decision making, I reviewed the following data within the [facility] notes reviewed and incorporated, Labs reviewed, notes from prior ED visits and [place] Controlled Substance Database   ____________________________________________   FINAL CLINICAL IMPRESSION(S) / ED DIAGNOSES  Final diagnoses:  Influenza-like illness      NEW MEDICATIONS STARTED DURING THIS VISIT:  Discharge Medication List as of 01/02/2019  6:15 PM       Note:  This document was prepared using Dragon voice recognition software and may include unintentional dictation errors.    Merlyn Lot, MD 01/02/19 [id number]

## 2019-01-02 NOTE — Discharge Instructions (Addendum)
You have a viral illness which can have symptoms like muscle aches, fevers, chills, runny nose, cough, sneezing, sore throat, vomiting or diarrhea. One of the viruses that can cause this is SARS- CoV-2, the virus that causes COVID-19, also known as the novel coronavirus. You could also have a different viral infection such as the common cold or flu. Most patients with viral illness including COVID-19 have mild symptoms and recover on their own. Resting, staying hydrated, and sleeping are helpful. Today we think you are well enough to go home and treat your symptoms with oral liquids, and medicine for fevers, cough, and pain.  We generally do not do COVID-19 testing on people with mild symptoms who are being discharged from the Emergency Department or Clinic.   If we did a test for COVID-19 the results will not be available for several days. We will call you with the result. Please DO NOT CONTACT THE EMERGENCY DEPARTMENT OR CLINIC FOR RESULTS OF THIS TEST.   Please follow the precautions below:  Stay home for at least 7 days after your symptoms began OR for 3 days after your fever ends, whichever takes longer. ?  If people live with you they should also stay home and avoid contact with others for 14 days.?   ISOLATION GUIDANCE FOR POSSIBLE COVID Most people with cough and fever have an illness caused by a virus. One is COVID-19. Not all people with these infections are being tested for the virus that causes COVID-19. People who might have COVID but are not being tested should still try to prevent the spread of the infection.  These instructions are modified recommendations from the [place].  People who might have COVID-19 should follow the instructions below until a doctor or health department says they can stop.  Stay home unless you need to see a doctor Stop doing things outside your home except for getting medical care. Do not go to work, school,  or public areas; and do not use public transportation or taxis.  Call ahead before visiting the doctor Before your appointment, call the doctor's office and tell them about your symptoms. This will help them take steps to keep other people from getting infected.   Keep track of your symptoms Symptoms are the things you feel, like fever or trouble breathing. Go to your doctor or the ER if you think you are getting worse, like having more trouble breathing. Call the doctor's office and tell them about your symptoms. This will help them take steps to keep other people from getting sick.   Wear a face mask You should wear a face mask that covers your nose and mouth when you are in the same room with other people and when you visit the doctor's office. People who live with you or visit you should also wear a face mask when they are in the same room with you.  Separate yourself from other people in your home You should stay in a different room from other people in your home. You should stay separate from your family members as much as possible. You should use a separate bathroom if you can.  Avoid sharing things in your house Don't share dishes, drinking glasses, cups, eating utensils, towels, bedding, or other things with people in your home. After using these things please wash them really well with soap and water.  Cover your coughs and sneezes Cover your mouth and nose with a tissue when you  cough or sneeze, or you can cough or sneeze into your sleeve. Throw used tissues in a trash can that has a bag in it, and immediately wash your hands with soap and water for at least 20 seconds. If you use an alcohol-based hand rub please rub your hands together for 20 seconds.  Wash your [facility] your hands often and very well with soap and water for at least 20 seconds. If your hands are not visibly dirty you can use an alcohol-based hand sanitizer. Don't touch your eyes, nose, or mouth with unwashed  hands.    Instructions for People Helping Care for Patients with Possible COVID-19 Follow your doctor's instructions Make sure that you understand and can help the patient follow any instructions for care.  Provide for the patient's basic needs You should help the patient with basic needs in the home and provide support for getting groceries, prescriptions, and other personal needs.  Keep track of the patient's symptoms If they are getting sicker, call his or her doctor. This will help the doctor's office take steps to keep other people from getting infected.  Limit the number of people who have contact with the patient If possible, have only one caregiver for the patient. Other family members should stay in another home or place of residence. If they can't, they should stay in another room and stay separated from the patient as much as possible. Keep one bathroom JUST for the patient if you can.  Only allow visitors if they MUST be in the home.  Keep older adults, very young children, and other sick people away from the patient Keep older adults, very young children, and people who have compromised immune systems or chronic health conditions away from the patient. This includes people with chronic heart, lung, or kidney conditions, diabetes, and cancer.  Wash your hands often Avoid touching your eyes, nose, and mouth with unwashed hands. Wash your hands often and thoroughly with soap and water for at least 20 seconds. You can use an alcohol-based hand sanitizer if soap and water are not available and if your hands are not visibly dirty.  Use disposable paper towels to dry your hands. If not available, use dedicated cloth towels and replace them when they become wet.  Avoid contamination from face masks and gloves Wear a disposable face mask and gloves whenever you are touching the patient, things in their room or bathroom, or things that can have their body fluid on them, like bedding  or dishes, or blood, vomit, urine, or feces (poop).  Ensure the mask fits over your nose and mouth tightly, and do not touch it at all while you are wearing it. Throw out disposable facemasks and gloves after using them. Do not reuse. Wash your hands immediately after removing your facemask and gloves. If your personal clothing becomes dirty with a patient's body fluids, carefully remove clothing and launder. Wash your hands after handling dirty clothing. Place all used disposable facemasks, gloves, and other waste in a lined container before disposing them with other household garbage.  Remove gloves and wash your hands immediately after handling these items.  Do not share dishes, glasses, or other household items with the patient Avoid sharing household items. You should not share dishes, drinking glasses, cups, eating utensils, towels, bedding, or other items with a patient who is confirmed to have, or being evaluated for, COVID-19 infection.  After the person uses these items, you should wash them very well with soap and  water.  Wash laundry thoroughly Immediately remove and wash clothes or bedding that have blood, body fluids, and/or secretions or excretions, such as sweat, saliva, sputum, nasal mucus, vomit, urine, or feces, on them.  Wear gloves when handling laundry from the patient.  Read and follow directions on labels of laundry or clothing items and detergent. In general, wash and dry with the warmest temperatures recommended on the label.  Clean all areas the individual has used  Clean all touchable surfaces, such as counters, tabletops, doorknobs, bathroom fixtures, toilets, phones, keyboards, tablets, and bedside tables, every day. Also, clean any surfaces that may have blood, body fluids, and/or secretions or excretions on them. Wear gloves when cleaning surfaces the patient has come in contact with.  Use a diluted bleach solution (dilute bleach with 1 part bleach and 10 parts  water) or a household disinfectant with a label that says EPA-registered for coronaviruses. To make a bleach solution at home, add 1 tablespoon of bleach to 1 quart (4 cups) of water. For a larger supply, add  cup of bleach to 1 gallon (16 cups) of water.  Read labels of cleaning products and follow recommendations provided on product labels. Labels contain instructions for safe and effective use of the cleaning product including precautions you should take when applying the product, such as wearing gloves or eye protection and making sure you have good ventilation during use of the product.  Remove gloves and wash hands immediately after cleaning.  Monitor yourself for signs and symptoms of illness Caregivers and household members are considered close contacts, should monitor their health, and will be asked to limit movement outside of the home as much as possible.   If you have additional questions Nutritional therapist or Healthcare Provider The [facility] website (AssistantPositions.pl) The [place] hotline 336-70COVID Your local health department   This guidance is subject to change. For the most up to date guidance, check the state Department of Health website at [email or web address].GOV

## 2019-01-05 ENCOUNTER — Telehealth: Payer: Self-pay | Admitting: Emergency Medicine

## 2019-01-05 LAB — NOVEL CORONAVIRUS, NAA (HOSP ORDER, SEND-OUT TO REF LAB; TAT 18-24 HRS): SARS-CoV-2, NAA: NOT DETECTED

## 2019-01-05 NOTE — Telephone Encounter (Signed)
Called patient and given covid result.

## 2019-02-18 ENCOUNTER — Ambulatory Visit (INDEPENDENT_AMBULATORY_CARE_PROVIDER_SITE_OTHER): Payer: No Typology Code available for payment source | Admitting: Physician Assistant

## 2019-02-18 DIAGNOSIS — S161XXA Strain of muscle, fascia and tendon at neck level, initial encounter: Secondary | ICD-10-CM | POA: Diagnosis not present

## 2019-02-18 MED ORDER — CYCLOBENZAPRINE HCL 5 MG PO TABS: 5.0000 mg | ORAL_TABLET | Freq: Three times a day (TID) | ORAL | 1 refills | Status: DC | PRN

## 2019-02-18 NOTE — Progress Notes (Signed)
Patient: Brandy Ballard Female    DOB: 1966-02-13   53 y.o.   MRN: [id number] Visit Date: 02/18/2019  Today's Provider: Trinna Post, PA-C   Chief Complaint  Patient presents with  . Neck Pain  . Shoulder Pain   Subjective:    Virtual Visit via Video Note  I connected with Brandy Ballard on 02/18/19 at  2:40 PM EDT by a video enabled telemedicine application and verified that I am speaking with the correct person using two identifiers.  Location: Patient: Home Provider: Office    I discussed the limitations of evaluation and management by telemedicine and the availability of in person appointments. The patient expressed understanding and agreed to proceed.  Neck Pain   This is a new problem. The current episode started yesterday. The problem occurs constantly. The problem has been gradually worsening. The pain is associated with nothing. The pain is present in the left side, right side and occipital region. The quality of the pain is described as shooting and aching. The pain is at a severity of 7/10. The pain is same all the time. Stiffness is present all day. Associated symptoms comments: Shoulder pain and swelling . She has tried acetaminophen, NSAIDs and heat for the symptoms. The treatment provided mild relief.   Patient reports she has also quit smoking x two weeks and lost 7 lbs. She has increased her exercise and a day after doing some exercising she noticed this pain and stiffness in her neck, as described above.   Allergies  Allergen Reactions  . Nsaids     Avoids due to Hx Peptic ulcers  . Clearasil Daily Clear Acne [Benzoyl Peroxide] Rash     Current Outpatient Medications:  .  acetaminophen (TYLENOL) 500 MG tablet, Take 1,000 mg by mouth daily. , Disp: , Rfl:  .  atorvastatin (LIPITOR) 20 MG tablet, Take 1 tablet (20 mg total) by mouth daily., Disp: 90 tablet, Rfl: 1 .  cetirizine (ZYRTEC) 10 MG tablet, Take 10 mg by mouth daily., Disp: , Rfl:  .  estradiol  (CLIMARA - DOSED IN MG/24 HR) 0.025 mg/24hr patch, Place 1 patch (0.025 mg total) onto the skin once a week., Disp: 4 patch, Rfl: 11 .  Fluticasone Propionate (XHANCE) 93 MCG/ACT EXHU, Place into the nose daily as needed., Disp: , Rfl:  .  ketoconazole (NIZORAL) 2 % cream, Apply 1 application topically daily as needed for irritation. , Disp: , Rfl:  .  metroNIDAZOLE (METROCREAM) 0.75 % cream, Apply 1 application topically 2 (two) times daily as needed (irritation). , Disp: , Rfl:  .  omeprazole (PRILOSEC) 40 MG capsule, Take 40 mg by mouth daily., Disp: , Rfl:   Review of Systems  Constitutional: Negative.   Respiratory: Negative.   Cardiovascular: Negative.   Musculoskeletal: Positive for neck pain.       Shoulder pain     Social History   Tobacco Use  . Smoking status: Current Every Day Smoker    Packs/day: 0.25    Years: 20.00    Pack years: 5.00    Types: Cigarettes  . Smokeless tobacco: Never Used  . Tobacco comment: down to 2-4 daily  Substance Use Topics  . Alcohol use: Not Currently      Objective:   There were no vitals taken for this visit. There were no vitals filed for this visit.   Physical Exam Constitutional:      Appearance: Normal appearance.  Neck:  Musculoskeletal: Decreased range of motion.     Comments: She has nearly full active ROM in all directions in her neck, though it appears to be painful.  Neurological:     Mental Status: She is alert.         Assessment & Plan    1. Strain of neck muscle, initial encounter  Counseled on small short course of NSAIDS which I think will be tolerated despite her history of peptic ulcers. Will give flexeril as below, work note provided. Congratulated on weight loss and tobacco cessation. Will see her for CPE before the year is over.   - cyclobenzaprine (FLEXERIL) 5 MG tablet; Take 1 tablet (5 mg total) by mouth 3 (three) times daily as needed for muscle spasms.  Dispense: 30 tablet; Refill: 1  I  discussed the assessment and treatment plan with the patient. The patient was provided an opportunity to ask questions and all were answered. The patient agreed with the plan and demonstrated an understanding of the instructions.   The patient was advised to call back or seek an in-person evaluation if the symptoms worsen or if the condition fails to improve as anticipated.  The entirety of the information documented in the History of Present Illness, Review of Systems and Physical Exam were personally obtained by me. Portions of this information were initially documented by Idelle Jo, CMA and reviewed by me for thoroughness and accuracy.         Trinna Post, PA-C  [facility]

## 2019-02-18 NOTE — Patient Instructions (Signed)
Cervical Sprain    A cervical sprain is a stretch or tear in the tissues that connect bones (ligaments) in the neck. Most neck (cervical) sprains get better in 4-6 weeks.  Follow these instructions at home:  If you have a neck collar:   Wear it as told by your doctor. Do not take off (do not remove) the collar unless your doctor says that this is safe.   Ask your doctor before adjusting your collar.   If you have long hair, keep it outside of the collar.   Ask your doctor if you may take off the collar for cleaning and bathing. If you may take off the collar:  ? Follow instructions from your doctor about how to take off the collar safely.  ? Clean the collar by wiping it with mild soap and water. Let it air-dry all the way.  ? If your collar has removable pads:   Take the pads out every 1-2 days.   Hand wash the pads with soap and water.   Let the pads air-dry all the way before you put them back in the collar. Do not dry them in a clothes dryer. Do not dry them with a hair dryer.  ? Check your skin under the collar for irritation or sores. If you see any, tell your doctor.  Managing pain, stiffness, and swelling     Use a cervical traction device, if told by your doctor.   If told, put heat on the affected area. Do this before exercises (physical therapy) or as often as told by your doctor. Use the heat source that your doctor recommends, such as a moist heat pack or a heating pad.  ? Place a towel between your skin and the heat source.  ? Leave the heat on for 20-30 minutes.  ? Take the heat off (remove the heat) if your skin turns bright red. This is very important if you cannot feel pain, heat, or cold. You may have a greater risk of getting burned.   Put ice on the affected area.  ? Put ice in a plastic bag.  ? Place a towel between your skin and the bag.  ? Leave the ice on for 20 minutes, 2-3 times a day.  Activity   Do not drive while wearing a neck collar. If you do not have a neck collar, ask  your doctor if it is safe to drive.   Do not drive or use heavy machinery while taking prescription pain medicine or muscle relaxants, unless your doctor approves.   Do not lift anything that is heavier than 10 lb (4.5 kg) until your doctor tells you that it is safe.   Rest as told by your doctor.   Avoid activities that make you feel worse. Ask your doctor what activities are safe for you.   Do exercises as told by your doctor or physical therapist.  Preventing neck sprain   Practice good posture. Adjust your workstation to help with this, if needed.   Exercise regularly as told by your doctor or physical therapist.   Avoid activities that are risky or may cause a neck sprain (cervical sprain).  General instructions   Take over-the-counter and prescription medicines only as told by your doctor.   Do not use any products that contain nicotine or tobacco. This includes cigarettes and e-cigarettes. If you need help quitting, ask your doctor.   Keep all follow-up visits as told by your doctor. This is important.    Contact a doctor if:   You have pain or other symptoms that get worse.   You have symptoms that do not get better after 2 weeks.   You have pain that does not get better with medicine.   You start to have new, unexplained symptoms.   You have sores or irritated skin from wearing your neck collar.  Get help right away if:   You have very bad pain.   You have any of the following in any part of your body:  ? Loss of feeling (numbness).  ? Tingling.  ? Weakness.   You cannot move a part of your body (you have paralysis).   Your activity level does not improve.  Summary   A cervical sprain is a stretch or tear in the tissues that connect bones (ligaments) in the neck.   If you have a neck (cervical) collar, do not take off the collar unless your doctor says that this is safe.   Put ice on affected areas as told by your doctor.   Put heat on affected areas as told by your doctor.   Good  posture and regular exercise can help prevent a neck sprain from happening again.  This information is not intended to replace advice given to you by your health care provider. Make sure you discuss any questions you have with your health care provider.  Document Released: 03/03/2008 Document Revised: 05/27/2016 Document Reviewed: 05/27/2016  Elsevier Interactive Patient Education  2019 [employer]

## 2019-02-28 ENCOUNTER — Telehealth: Payer: Self-pay | Admitting: *Deleted

## 2019-02-28 DIAGNOSIS — L989 Disorder of the skin and subcutaneous tissue, unspecified: Secondary | ICD-10-CM

## 2019-02-28 NOTE — Telephone Encounter (Signed)
Patient called office requesting a referral to a dermatologist for mole removal. Please advise?

## 2019-03-01 NOTE — Telephone Encounter (Signed)
Referral placed.

## 2019-05-27 ENCOUNTER — Ambulatory Visit (INDEPENDENT_AMBULATORY_CARE_PROVIDER_SITE_OTHER)
Admission: RE | Admit: 2019-05-27 | Discharge: 2019-05-27 | Disposition: A | Discharge status: Home / Self Care | Payer: No Typology Code available for payment source | Source: Ambulatory Visit

## 2019-05-27 ENCOUNTER — Other Ambulatory Visit: Payer: Self-pay

## 2019-05-27 DIAGNOSIS — H9202 Otalgia, left ear: Secondary | ICD-10-CM

## 2019-05-27 MED ORDER — POLYMYXIN B-TRIMETHOPRIM 10000-0.1 UNIT/ML-% OP SOLN: 1.0000 [drp] | OPHTHALMIC | 0 refills | Status: DC

## 2019-05-27 NOTE — Discharge Instructions (Signed)
Use the eardrops every 4 hours while awake. If your symptoms continue or worsen you will need to be seen in person You can take ibuprofen as needed for pain Follow up as needed for continued or worsening symptoms

## 2019-05-27 NOTE — ED Provider Notes (Signed)
Virtual Visit via Video Note:  Brandy Ballard  initiated request for Telemedicine visit with [facility] team. I connected with Brandy Ballard  on 05/27/2019 at 2:01 PM  for [place] synchronized telemedicine visit using [place] video enabled HIPPA compliant telemedicine application. I verified that I am speaking with Brandy Ballard  using two identifiers. Brandy July, NP  was physically located in [facility] Urgent care site and Brandy Ballard was located at [place] different location.   The limitations of evaluation and management by telemedicine as well as the availability of in-person appointments were discussed. Patient was informed that she  may incur [place] bill ( including co-pay) for this virtual visit encounter. Brandy Ballard  expressed understanding and gave verbal consent to proceed with virtual visit.     History of Present Illness:Brandy Ballard  is [place] 53 y.o. female presents with left ear pain.  This is been present and worsening since yesterday.  Reportedly she went swimming prior to this starting.  Feels like she still has water in her ear and moderate pain.  Denies any ear swelling or drainage.  Attempted to put [place] piece of cotton in her ear to suck up the water but this did not do anything.  Tried swimmer eardrops with no relief.  No associated fevers.  Past Medical History:  Diagnosis Date  . Allergy   . Anemia   . Anxiety    pt denies  . Arthritis    lower back  . Blood transfusion without reported diagnosis   . Dental bridge present    permanent lower retainer  . Depression    pt denies  . GERD (gastroesophageal reflux disease)   . Hashimoto's disease   . Polyp of nasal sinus    multiple  . PONV (postoperative nausea and vomiting)    nausea only  . Sinus disease   . Thyroid disease     Allergies  Allergen Reactions  . Nsaids     Avoids due to Hx Peptic ulcers  . Clearasil Daily Clear Acne [Benzoyl Peroxide] Rash        Observations/Objective:VITALS: Per patient if applicable, see  vitals. GENERAL: Alert, appears well and in no acute distress. HEENT: Atraumatic, conjunctiva clear, no obvious abnormalities on inspection of external nose and ears. NECK: Normal movements of the head and neck. CARDIOPULMONARY: No increased WOB. Speaking in clear sentences. I:E ratio WNL.  MS: Moves all visible extremities without noticeable abnormality. PSYCH: Pleasant and cooperative, well-groomed. Speech normal rate and rhythm. Affect is appropriate. Insight and judgement are appropriate. Attention is focused, linear, and appropriate.  NEURO: CN grossly intact. Oriented as arrived to appointment on time with no prompting. Moves both UE equally.  SKIN: No obvious lesions, wounds, erythema, or cyanosis noted on face or hands.     Assessment and Plan: We will treat for possible swimmer's ear with Polytrim drops.   Follow Up Instructions: Recommended if symptoms continue or worsen she will need to be seen in person for exam     I discussed the assessment and treatment plan with the patient. The patient was provided an opportunity to ask questions and all were answered. The patient agreed with the plan and demonstrated an understanding of the instructions.   The patient was advised to call back or seek an in-person evaluation if the symptoms worsen or if the condition fails to improve as anticipated.    Brandy July, NP  05/27/2019 2:01 PM  Brandy Halt A, NP 05/27/19 1402

## 2019-05-29 ENCOUNTER — Other Ambulatory Visit: Payer: Self-pay

## 2019-05-29 ENCOUNTER — Emergency Department
Admission: EM | Admit: 2019-05-29 | Discharge: 2019-05-29 | Disposition: A | Discharge status: Home / Self Care | Payer: No Typology Code available for payment source | Attending: Emergency Medicine | Admitting: Emergency Medicine

## 2019-05-29 ENCOUNTER — Encounter: Payer: Self-pay | Admitting: Emergency Medicine

## 2019-05-29 ENCOUNTER — Ambulatory Visit (INDEPENDENT_AMBULATORY_CARE_PROVIDER_SITE_OTHER)
Admission: EM | Admit: 2019-05-29 | Discharge: 2019-05-29 | Disposition: A | Discharge status: Home / Self Care | Payer: No Typology Code available for payment source | Source: Home / Self Care

## 2019-05-29 DIAGNOSIS — H9209 Otalgia, unspecified ear: Secondary | ICD-10-CM | POA: Insufficient documentation

## 2019-05-29 DIAGNOSIS — H60332 Swimmer's ear, left ear: Secondary | ICD-10-CM | POA: Diagnosis not present

## 2019-05-29 DIAGNOSIS — Z5321 Procedure and treatment not carried out due to patient leaving prior to being seen by health care provider: Secondary | ICD-10-CM | POA: Insufficient documentation

## 2019-05-29 MED ORDER — CIPRODEX 0.3-0.1 % OT SUSP: 4.0000 [drp] | Freq: Two times a day (BID) | OTIC | 0 refills | Status: DC

## 2019-05-29 MED ORDER — TRAMADOL HCL 50 MG PO TABS: 50.0000 mg | ORAL_TABLET | Freq: Two times a day (BID) | ORAL | 0 refills | Status: DC | PRN

## 2019-05-29 NOTE — ED Notes (Signed)
Pt decided after 1hr45min that she didn't want to wait any longer; has decided to leave; will follow up with urgent care; ambulatory with steady gait

## 2019-05-29 NOTE — Discharge Instructions (Addendum)
It was very nice seeing you today in clinic. Thank you for entrusting me with your care.   Keep ear clean and dry. Use ear drops and pain medications. Call clinic if not improving.   Make arrangements to follow up with your regular doctor in 1 week for re-evaluation if not improving. If your symptoms/condition worsens, please seek follow up care either here or in the ER. Please remember, our [place] providers are "right here with you" when you need [place].   Again, it was my pleasure to take care of you today. Thank you for choosing our clinic. I hope that you start to feel better quickly.   Honor Loh, MSN, APRN, FNP-C, CEN Advanced Practice Provider [facility]

## 2019-05-29 NOTE — ED Triage Notes (Addendum)
Pt reports left earache since Thursday evening; had tele-visit Friday; was prescribed some medication and pain has only gotten worse; pt says her ear is swollen shut and the swelling has moved to her face as well; no facial swelling noted when pt pulled it down for temp check;

## 2019-05-29 NOTE — ED Triage Notes (Signed)
Patient c/o pain and swelling in her left ear since Thursday.  Patient denies fevers.

## 2019-05-29 NOTE — ED Provider Notes (Signed)
[place], [place]   Name: Brandy Ballard DOB: [birth year] MRN: [id number] CSN: [id number] PCP: Trinna Post, PA-C  Arrival date and time:  05/29/19 [id number]  Chief Complaint:  Otalgia (left)   NOTE: Prior to seeing the patient today, I have reviewed the triage nursing documentation and vital signs. Clinical staff has updated patient's PMH/PSHx, current medication list, and drug allergies/intolerances to ensure comprehensive history available to assist in medical decision making.   History:   HPI: Brandy Ballard is a 53 y.o. female who presents today with complaints of severe pain in her LEFT ear. Pain began with acute onset Thursday (05/26/2019). She denies any associated fevers. Patient has not had any other recent upper respiratory symptoms; no cough, congestion, rhinorrhea, sneezing, or sore throat. She denies forceful nose blowing. Patient has appreciated clear otorrhea. She advises that her ability to hear from the LEFT ear has acutely changed with the onset of the pain; describes hearing as being muffled. Patient denies history of recurrent ear infections. She has never had tympanostomy tubes in the past. Patient advising that she has been swimming in the recent past.   Patient seen via tele-health visit on 05/27/2019 by Rozanna Box, NP; notes reviewed. Patient treated for LEFT otitis externa with Polytrim ophthalmic gtts. Despite using the gtts over the course of the last couple of days, patient notes that she feels like she has worsened. Patient complains of increased pain and swelling in the ear. She notes that the LEFT pre-auricular area is tender to touch and has started to swell as well. Pain became so bad during the night that she presented to the ED for further evaluation and treatment. Due to wait times, patient LWBS and advised that she would present to urgent care.   Past Medical History:  Diagnosis Date  . Allergy   . Anemia   . Arthritis    lower back  . Blood transfusion without reported  diagnosis   . Dental bridge present    permanent lower retainer  . GERD (gastroesophageal reflux disease)   . Hashimoto's disease   . Polyp of nasal sinus    multiple  . PONV (postoperative nausea and vomiting)    nausea only  . Sinus disease   . Thyroid disease     Past Surgical History:  Procedure Laterality Date  . ABDOMINAL HYSTERECTOMY    . COLONOSCOPY WITH PROPOFOL N/A 07/19/2018   Procedure: COLONOSCOPY WITH Biopsies;  Surgeon: Lucilla Lame, MD;  Location: [facility];  Service: Endoscopy;  Laterality: N/A;  . ESOPHAGOGASTRODUODENOSCOPY (EGD) WITH PROPOFOL N/A 11/09/2018   Procedure: ESOPHAGOGASTRODUODENOSCOPY (EGD) WITH BIOPSIES;  Surgeon: Virgel Manifold, MD;  Location: [facility];  Service: Endoscopy;  Laterality: N/A;  . FRONTAL SINUS EXPLORATION Bilateral 10/05/2018   Procedure: FRONTAL SINUS EXPLORATION;  Surgeon: Beverly Gust, MD;  Location: [facility];  Service: ENT;  Laterality: Bilateral;  . IMAGE GUIDED SINUS SURGERY N/A 10/05/2018   Procedure: IMAGE GUIDED SINUS SURGERY;  Surgeon: Beverly Gust, MD;  Location: [facility];  Service: ENT;  Laterality: N/A;  . NASAL TURBINATE REDUCTION  10/05/2018   Procedure: TURBINATE REDUCTION/SUBMUCOSAL RESECTION;  Surgeon: Beverly Gust, MD;  Location: [facility];  Service: ENT;;  . OVARIAN CYST REMOVAL    . POLYPECTOMY N/A 07/19/2018   Procedure: POLYPECTOMY;  Surgeon: Lucilla Lame, MD;  Location: [place];  Service: Endoscopy;  Laterality: N/A;  . SEPTOPLASTY WITH ETHMOIDECTOMY, AND MAXILLARY ANTROSTOMY Bilateral 10/05/2018   Procedure: SEPTOPLASTY WITH TOTAL ETHMOIDECTOMY, AND MAXILLARY ANTROSTOMY WITH  TISSUE REMOVAL;  Surgeon: Beverly Gust, MD;  Location: [facility];  Service: ENT;  Laterality: Bilateral;  . SPHENOIDECTOMY  10/05/2018   Procedure: TOTAL SPHENOIDECTOMY;  Surgeon: Beverly Gust, MD;  Location: [facility];  Service: ENT;;    Family History  Problem Relation Age of Onset  .  Meniere's disease Mother   . Hypertension Father   . Atrial fibrillation Father   . CVA Father   . Breast cancer Paternal Grandmother 67  . Heart attack Paternal Grandfather   . CVA Paternal Grandfather   . Polycystic ovary syndrome Daughter   . Breast cancer Paternal Aunt        over 10    Social History   Tobacco Use  . Smoking status: Former Smoker    Packs/day: 0.25    Years: 20.00    Pack years: 5.00    Types: Cigarettes    Quit date: 11/29/2018    Years since quitting: 0.4  . Smokeless tobacco: Never Used  . Tobacco comment: down to 2-4 daily  Substance Use Topics  . Alcohol use: Not Currently  . Drug use: Never    Patient Active Problem List   Diagnosis Date Noted  . Gastroesophageal reflux disease   . Gastric polyp   . Abdominal pain, epigastric   . Tobacco abuse 08/18/2018  . Encounter for screening colonoscopy   . Benign neoplasm of cecum   . Polyp of sigmoid colon     Home Medications:    Current Meds  Medication Sig  . estradiol (CLIMARA - DOSED IN MG/24 HR) 0.025 mg/24hr patch Place 1 patch (0.025 mg total) onto the skin once a week.  Marland Kitchen omeprazole (PRILOSEC) 40 MG capsule Take 40 mg by mouth daily.    Allergies:   Nsaids and Clearasil daily clear acne [benzoyl peroxide]  Review of Systems (ROS): Review of Systems  Constitutional: Negative for chills and fever.  HENT: Positive for ear discharge and ear pain. Negative for congestion, rhinorrhea, sinus pressure, sinus pain and sore throat.   Respiratory: Negative for cough and shortness of breath.   Cardiovascular: Negative for chest pain and palpitations.  Gastrointestinal: Negative for nausea and vomiting.  Neurological: Negative for dizziness, syncope, weakness and headaches.  Psychiatric/Behavioral: Positive for sleep disturbance. The patient is nervous/anxious.   All other systems reviewed and are negative.    Vital Signs: Today's Vitals   05/29/19 0928 05/29/19 0929 05/29/19 0946  BP:   (!) 143/98   Pulse:  87   Resp:  16   Temp:  98.7 F (37.1 C)   TempSrc:  Oral   SpO2:  99%   Weight: 210 lb (95.3 kg)    Height: 5\' 6"  (1.676 m)    PainSc: 10-Worst pain ever  10-Worst pain ever    Physical Exam: Physical Exam  Constitutional: She is oriented to person, place, and time. She appears distressed (2/2 pain).  HENT:  Head: Normocephalic and atraumatic.  Right Ear: Hearing, tympanic membrane, external ear and ear canal normal.  Left Ear: There is drainage, swelling and tenderness. Tympanic membrane is not injected, not erythematous and not bulging.  No middle ear effusion. Decreased hearing (muffled) is noted.  Nose: Nose normal.  Mouth/Throat: Uvula is midline, oropharynx is clear and moist and mucous membranes are normal.  Eyes: Pupils are equal, round, and reactive to light.  Neck: Normal range of motion. Neck supple.  Cardiovascular: Normal rate.  Pulmonary/Chest: Effort normal. No respiratory distress.  Lymphadenopathy:  Head (left side): Submandibular adenopathy present.  Neurological: She is alert and oriented to person, place, and time. Gait normal.  Skin: Skin is warm and dry. No rash noted.  Psychiatric: Memory, affect and judgment normal. Her mood appears anxious.  Nursing note and vitals reviewed.   Urgent Care Treatments / Results:   LABS: PLEASE NOTE: all labs that were ordered this encounter are listed, however only abnormal results are displayed. Labs Reviewed - No data to display  EKG: -None  RADIOLOGY: No results found.  PROCEDURES: Procedures  MEDICATIONS RECEIVED THIS VISIT: Medications - No data to display  PERTINENT CLINICAL COURSE NOTES/UPDATES:   Initial Impression / Assessment and Plan / Urgent Care Course:  Pertinent labs & imaging results that were available during my care of the patient were personally reviewed by me and considered in my medical decision making (see lab/imaging section of note for values and  interpretations).  Aroura Appleyard is a 53 y.o. female who presents to [facility] today with complaints of Otalgia (left)   Patient is well appearing overall in clinic today. She does not appear to be in any acute distress. Presenting symptoms (see HPI) and exam as documented above. Patient worsening despite treatment with Polytrim gtts. EAC with significant pain; pre-auricular area exquisitely tender and slightly swollen. Discontinue Polytrim gtts as they have not been effective in improving patient's symptoms. Discussed treatment with antibiotic + topical steroid. Will send in prescription for Ciprodex otic gtts x 7 days. Patient experiencing significant discomfort associated with her acute ear infection. She has been using APAP and IBU without relief. Will send in a short course of Tramadol for PRN use. Patient encouraged to keep ear and dry; cotton in ear when bathing. She was encouraged to return a call to the clinic if not improving. Due to the degree of swelling, we may have to consider systemic antimicrobial coverage if Ciprodex does not reduce her symptoms.   Discussed follow up with primary care physician in 1 week for re-evaluation. I have reviewed the follow up and strict return precautions for any new or worsening symptoms. Patient is aware of symptoms that would be deemed urgent/emergent, and would thus require further evaluation either here or in the emergency department. At the time of discharge, she verbalized understanding and consent with the discharge plan as it was reviewed with her. All questions were fielded by provider and/or clinic staff prior to patient discharge.    Final Clinical Impressions / Urgent Care Diagnoses:   Final diagnoses:  Acute swimmer's ear of left side    New Prescriptions:  [place] Controlled Substance Registry consulted? Yes, I have consulted the [place] Controlled Substances Registry for this patient, and feel the risk/benefit ratio today is favorable for  proceeding with this prescription for a controlled substance.  . Discussed use of controlled substance medication to treat her acute pain.  o Reviewed [place] STOP Act regulations  o Clinic does not refill controlled substances over the phone without face to face evaluation.  . Safety precautions reviewed.  o Medications should not be bitten, chewed, crushed, shared, or taken with alcohol.  o Avoid use while working, driving, or operating heavy machinery.  o Side effects associated with the use of this particular medication reviewed. - Patient understands that this medication can cause CNS depression, increase her risk of falls, and even lead to overdose that may result in death, if used outside of the parameters that she and I discussed.  With all of this in  mind, she knowingly accepts the risks and responsibilities associated with intended course of treatment, and elects to responsibly proceed as discussed.  Meds ordered this encounter  Medications  . ciprofloxacin-dexamethasone (CIPRODEX) OTIC suspension    Sig: Place 4 drops into the left ear 2 (two) times daily.    Dispense:  7.5 mL    Refill:  0  . traMADol (ULTRAM) 50 MG tablet    Sig: Take 1 tablet (50 mg total) by mouth every 12 (twelve) hours as needed.    Dispense:  10 tablet    Refill:  0    Recommended Follow up Care:  Patient encouraged to follow up with the following provider within the specified time frame, or sooner as dictated by the severity of her symptoms. As always, she was instructed that for any urgent/emergent care needs, she should seek care either here or in the emergency department for more immediate evaluation.  Follow-up Information    Trinna Post, PA-C In 1 week.   Specialty: Physician Assistant Why: General reassessment of symptoms if not improving Contact information: [address] [place] [address] [phone]         NOTE: This note was prepared using Dragon dictation  software along with smaller phrase technology. Despite my best ability to proofread, there is the potential that transcriptional errors may still occur from this process, and are completely unintentional.    Karen Kitchens, NP 05/30/19 [phone]

## 2019-06-08 NOTE — Progress Notes (Signed)
Patient: Brandy Ballard Female    DOB: Mar 22, 1966   53 y.o.   MRN: [id number] Visit Date: 06/09/2019  Today's Provider: Trinna Post, PA-C   Chief Complaint  Patient presents with  . Arm Pain    Right arm  . Numbness    Bilateral hands   Subjective:     Arm Pain  There was no injury mechanism. The pain is present in the right forearm and right wrist. The quality of the pain is described as shooting and aching. The pain has been constant since the incident. Associated symptoms include muscle weakness, numbness and tingling. Pertinent negatives include no chest pain. The symptoms are aggravated by movement, lifting and palpation. She has tried NSAIDs and rest for the symptoms. The treatment provided no relief.   She reports pain, numbness and tingling in her bilateral pinkies. Her right wrist is tender. She denies numbness and tingling in her other fingers. She feels some weakness. She works as a Marine scientist for [employer].   Allergies  Allergen Reactions  . Nsaids     Avoids due to Hx Peptic ulcers  . Clearasil Daily Clear Acne [Benzoyl Peroxide] Rash     Current Outpatient Medications:  .  ciprofloxacin-dexamethasone (CIPRODEX) OTIC suspension, Place 4 drops into the left ear 2 (two) times daily., Disp: 7.5 mL, Rfl: 0 .  estradiol (CLIMARA - DOSED IN MG/24 HR) 0.025 mg/24hr patch, Place 1 patch (0.025 mg total) onto the skin once a week., Disp: 4 patch, Rfl: 11 .  acetaminophen (TYLENOL) 500 MG tablet, Take 1,000 mg by mouth daily. , Disp: , Rfl:  .  cetirizine (ZYRTEC) 10 MG tablet, Take 10 mg by mouth daily., Disp: , Rfl:  .  cyclobenzaprine (FLEXERIL) 5 MG tablet, Take 1 tablet (5 mg total) by mouth 3 (three) times daily as needed for muscle spasms., Disp: 30 tablet, Rfl: 1 .  Fluticasone Propionate (XHANCE) 93 MCG/ACT EXHU, Place into the nose daily as needed., Disp: , Rfl:  .  omeprazole (PRILOSEC) 40 MG capsule, Take 40 mg by mouth daily., Disp: , Rfl:  .  traMADol (ULTRAM)  50 MG tablet, Take 1 tablet (50 mg total) by mouth every 12 (twelve) hours as needed., Disp: 10 tablet, Rfl: 0  Review of Systems  Constitutional: Negative.  Negative for appetite change, chills, fatigue and fever.  Respiratory: Negative.  Negative for chest tightness and shortness of breath.   Cardiovascular: Negative.  Negative for chest pain and palpitations.  Gastrointestinal: Negative for abdominal pain, diarrhea, nausea and vomiting.  Musculoskeletal: Positive for arthralgias and myalgias. Negative for back pain, gait problem, joint swelling, neck pain and neck stiffness.  Neurological: Positive for tingling and numbness. Negative for dizziness and weakness.    Social History   Tobacco Use  . Smoking status: Former Smoker    Packs/day: 0.25    Years: 20.00    Pack years: 5.00    Types: Cigarettes    Quit date: 11/29/2018    Years since quitting: 0.5  . Smokeless tobacco: Never Used  . Tobacco comment: down to 2-4 daily  Substance Use Topics  . Alcohol use: Not Currently      Objective:   BP 127/87 (BP Location: Left Arm, Patient Position: Sitting, Cuff Size: Large)   Pulse 79   Temp (!) 96.9 F (36.1 C) (Temporal)   Wt 226 lb (102.5 kg)   BMI 36.48 kg/m  Vitals:   06/09/19 0839  BP: 127/87  Pulse: 79  Temp: (!) 96.9 F (36.1 C)  TempSrc: Temporal  Weight: 226 lb (102.5 kg)  Body mass index is 36.48 kg/m.   Physical Exam Constitutional:      Appearance: Normal appearance.  Cardiovascular:     Rate and Rhythm: Normal rate.  Pulmonary:     Effort: Pulmonary effort is normal.  Musculoskeletal:     Right wrist: She exhibits tenderness.       Arms:     Left hand: She exhibits tenderness. She exhibits normal range of motion. Normal sensation noted. Normal strength noted. She exhibits no finger abduction, no thumb/finger opposition and no wrist extension trouble.       Hands:     Comments: Pain along her bilateral pinkies. Tinels and phalens negative  bilaterally.   Skin:    General: Skin is warm and dry.  Neurological:     Mental Status: She is alert and oriented to person, place, and time. Mental status is at baseline.  Psychiatric:        Mood and Affect: Mood normal.        Behavior: Behavior normal.      No results found for any visits on 06/09/19.     Assessment & Plan    1. Cubital tunnel syndrome, unspecified laterality  Counseled on using NSAIDs, icing, resting, avoiding leaning on elbows and prolonged periods with elbows bent. If not improving, may need to see orthopedist.   2. Need for influenza vaccination  - Flu Vaccine QUAD 6+ mos PF IM (Fluarix Quad PF)  The entirety of the information documented in the History of Present Illness, Review of Systems and Physical Exam were personally obtained by me. Portions of this information were initially documented by Ashley Royalty, CMA and reviewed by me for thoroughness and accuracy.   Trinna Post, PA-C  [facility]

## 2019-06-09 ENCOUNTER — Ambulatory Visit (INDEPENDENT_AMBULATORY_CARE_PROVIDER_SITE_OTHER): Payer: No Typology Code available for payment source | Admitting: Physician Assistant

## 2019-06-09 ENCOUNTER — Encounter: Payer: Self-pay | Admitting: Physician Assistant

## 2019-06-09 ENCOUNTER — Other Ambulatory Visit: Payer: Self-pay

## 2019-06-09 VITALS — BP 127/87 | HR 79 | Temp 96.9°F | Wt 226.0 lb

## 2019-06-09 DIAGNOSIS — Z23 Encounter for immunization: Secondary | ICD-10-CM

## 2019-06-09 DIAGNOSIS — G5620 Lesion of ulnar nerve, unspecified upper limb: Secondary | ICD-10-CM | POA: Diagnosis not present

## 2019-06-09 NOTE — Patient Instructions (Signed)
Cubital Tunnel Syndrome  Cubital tunnel syndrome is a condition that causes pain and weakness of the forearm and hand. It happens when one of the nerves that runs along the inside of the elbow joint (ulnar nerve) becomes irritated. This condition is usually caused by repeated arm motions that are done during sports or work-related activities. What are the causes? This condition may be caused by:  Increased pressure on the ulnar nerve at the elbow, arm, or forearm. This can result from: ? Irritation caused by repeated elbow bending. ? Poorly healed elbow fractures. ? Tumors in the elbow. These are usually noncancerous (benign). ? Scar tissue that develops in the elbow after an injury. ? Bony growths (spurs) near the ulnar nerve.  Stretching of the nerve due to loose elbow ligaments.  Trauma to the nerve at the elbow. What increases the risk? The following factors may make you more likely to develop this condition:  Doing manual labor that requires frequent bending of the elbow.  Playing sports that include repeated or strenuous throwing motions, such as baseball.  Playing contact sports, such as football or lacrosse.  Not warming up properly before activities.  Having diabetes.  Having an underactive thyroid (hypothyroidism). What are the signs or symptoms? Symptoms of this condition include:  Clumsiness or weakness of the hand.  Tenderness of the inner elbow.  Aching or soreness of the inner elbow, forearm, or fingers, especially the little finger or the ring finger.  Increased pain when forcing the elbow to bend.  Reduced control when throwing objects.  Tingling, numbness, or a burning feeling inside the forearm or in part of the hand or fingers, especially the little finger or the ring finger.  Sharp pains that shoot from the elbow down to the wrist and hand.  The inability to grip or pinch hard. How is this diagnosed? This condition is diagnosed based on:  Your  symptoms and medical history. Your health care provider will also ask for details about any injury.  A physical exam. You may also have tests, including:  Electromyogram (EMG). This test measures electrical signals sent by your nerves into the muscles.  Nerve conduction study. This test measures how well electrical signals pass through your nerves.  Imaging tests, such as X-rays, ultrasound, and MRI. These tests check for possible causes of your condition. How is this treated? This condition may be treated by:  Stopping the activities that are causing your symptoms to get worse.  Icing and taking medicines to reduce pain and swelling.  Wearing a splint to prevent your elbow from bending, or wearing an elbow pad where the ulnar nerve is closest to the skin.  Working with a physical therapist in less severe cases. This may help to: ? Decrease your symptoms. ? Improve the strength and range of motion of your elbow, forearm, and hand. If these treatments do not help, surgery may be needed. Follow these instructions at home: If you have a splint:  Wear the splint as told by your health care provider. Remove it only as told by your health care provider.  Loosen the splint if your fingers tingle, become numb, or turn cold and blue.  Keep the splint clean.  If the splint is not waterproof: ? Do not let it get wet. ? Cover it with a watertight covering when you take a bath or shower. Managing pain, stiffness, and swelling   If directed, put ice on the injured area: ? Put ice in a plastic bag. ?   Place a towel between your skin and the bag. ? Leave the ice on for 20 minutes, 2-3 times a day.  Move your fingers often to avoid stiffness and to lessen swelling.  Raise (elevate) the injured area above the level of your heart while you are sitting or lying down. General instructions  Take over-the-counter and prescription medicines only as told by your health care provider.  Do any  exercise or physical therapy as told by your health care provider.  Do not drive or use heavy machinery while taking prescription pain medicine.  If you were given an elbow pad, wear it as told by your health care provider.  Keep all follow-up visits as told by your health care provider. This is important. Contact a health care provider if:  Your symptoms get worse.  Your symptoms do not get better with treatment.  You have new pain.  Your hand on the injured side feels numb or cold. Summary  Cubital tunnel syndrome is a condition that causes pain and weakness of the forearm and hand.  You are more likely to develop this condition if you do work or play sports that involve repeated arm movements.  This condition is often treated by stopping repetitive activities, applying ice, and using anti-inflammatory medicines.  In rare cases, surgery may be needed. This information is not intended to replace advice given to you by your health care provider. Make sure you discuss any questions you have with your health care provider. Document Released: 09/15/2005 Document Revised: 02/01/2018 Document Reviewed: 02/01/2018 Elsevier Patient Education  2020 [employer]  

## 2019-06-22 ENCOUNTER — Other Ambulatory Visit: Payer: Self-pay | Admitting: Obstetrics and Gynecology

## 2019-06-22 ENCOUNTER — Telehealth: Payer: Self-pay

## 2019-06-22 DIAGNOSIS — N951 Menopausal and female climacteric states: Secondary | ICD-10-CM

## 2019-06-22 MED ORDER — ESTROGENS CONJUGATED 0.3 MG PO TABS: 0.3000 mg | ORAL_TABLET | Freq: Every day | ORAL | 11 refills | Status: DC

## 2019-06-22 NOTE — Telephone Encounter (Signed)
Pt calling requesting her estradiol be switched to the pill instead of patch. Please advise pt. [phone]

## 2019-06-22 NOTE — Telephone Encounter (Signed)
Please advise. Thank you

## 2019-06-22 NOTE — Telephone Encounter (Signed)
Rx sent; please notify patient. 

## 2019-06-22 NOTE — Progress Notes (Signed)
Some estrogen products °Drug and US brand name Available strengths  °Estrogen preparations and doses for the management of vasomotor symptoms  °Oral estradiol*  °Estrace¶ 0.5, 1, 2 mg  °Oral esterified estrogen*  °Menest 0.3, 0.625, 1.25 mg  °Oral estropipate  °Generic (previously available as Ortho-Est) 0.75, 1.5, 3 mg estropipate (equivalent to 0.625, 1.25, 2.5 mg conjugated equine estrogen)  °Oral conjugated equine estrogen (CEE)*  °Premarin 0.3, 0.45, 0.625, 0.9, 1.25 mg  °Oral conjugated synthetic estrogens (A)*  °A: Cenestin 0.3, 0.45, 0.625, 0.9 mg  °Oral estrogen-progestin combinations  °Prempro? 0.3 mg CEE/1.5 mg medroxyprogesterone, 0.45/1.5 mg, 0.625/2.5 mg, 0.625/5 mg  °Prefest 1 mg estradiol/0.09 mg norgestimate (cyclic)  °Activella, Amabelz, Mimvey¶ 0.5 mg estradiol/0.1 mg norethindrone acetate, 1 mg/0.5 mg  °FemHRT, Jevantique Lo 2.5 mcg ethinyl estradiol/0.5 mg norethindrone acetate  °Jinteli 5 mcg ethinyl estradiol/1 mg norethindrone acetate  °Angeliq 0.5 mg estradiol/0.25 mg drospirenone, 1 mg/0.5 mg  °Oral conjugated equine estrogens and bazedoxifene  °Duavee 0.45 mg CEE/20 mg bazedoxifene  ° Drug and US brand name Available strengths  °Estrogen preparations and doses for the management of vasomotor symptoms (continued)  °Estradiol patches*  °Alora (twice weekly) 0.025, 0.05, 0.075, 0.1 mg per day  °Generic (twice weekly) 0.025, 0.0375, 0.05, 0.075, 0.1 mg per day  °Minivelle (twice weekly) 0.025, 0.0375, 0.05, 0.075, 0.1 mg per day  °Vivelle-Dot (twice weekly) 0.025, 0.0375, 0.05, 0.075, 0.1 mg per day  °Climara¶ (weekly) 0.025, 0.0375, 0.05, 0.06, 0.075, 0.1 mg per day  °Menostar (weekly) 0.014 mg per day  °Estrogen-progestin patches  °Combi-Patch (twice weekly) 0.05 mg estradiol/0.14 mg norethindrone, 0.05 mg/0.25 mg per day  °Climara Pro (weekly) 0.045 mg estradiol/0.015 mg levonorgestrel per day  °Topical gel*  °EstroGel 0.06% 0.75 mg estradiol per pump  °Elestrin 0.06% 0.52 mg estradiol per  pump  °Divigel 0.1% 0.25, 0.5, 1 mg estradiol per pouch  °Topical spray*  °EvaMist 1.53 mg estradiol per spray  °Intravaginal rings*  °Femring 0.05 mg estradiol per day over three months, 0.1 mg estradiol per day over three months  °Depot options (oil, intramuscular)  °Estradiol cypionate  °Depo-Estradiol 5 mg/mL (5 mL)  °Estradiol valerate  °Delestrogen 10, 20, or 40 mg/mL (all 5 mL)  °Vaginal estrogen preparations for treatment of genitourinary atrophy (inadequate dose to relieve vasomotor symptoms)  °Vaginal ring  °Estring 7.5 mcg estradiol per day, released over three months  °Vaginal tablet  °Vagifem 10 mcg estradiol per vaginal tablet  °Yuvafem 10 mcg estradiol per vaginal tablet  °Vaginal cream  °Estrace 0.01% 0.1 mg estradiol per gram cream  °Premarin vaginal 0.625 mg CEE per gram cream  °  °Some esterified estrogen-methyl testosterone (EEMT) combinations remain available in [place]. These are considered unapproved products by US Food and Drug Administration (FDA) and are not recommended; refer to section on androgens in UpToDate topic review of treatment of menopausal symptoms with hormone therapy. ° ° ° ° °The first choice of progestin is oral natural micronized progesterone (200 mg/day for 12 days/month [ie, a cyclic regimen that is designed to mimic the normal luteal phase of premenopausal women] or 100 mg daily [continuous regimen]). We advise taking progesterone at bedtime as some of its metabolites are associated with somnolence. There are reasons to believe that natural progesterone is safer for the cardiovascular system (no adverse lipid effects) and possibly the breast ° °Vaginal, rather than oral administration of micronized progesterone capsules is easier to tolerate for some women,  ° °Some clinicians choose off-label use of the lower-dose levonorgestrel-releasing   intrauterine device (IUD). Discussions about this approach are typically between the patient and her obstetrician-gynecologic provider.   ° ° ° ° °

## 2019-06-28 ENCOUNTER — Telehealth: Payer: Self-pay | Admitting: Physician Assistant

## 2019-06-28 NOTE — Telephone Encounter (Signed)
Pt came in on 9/10 for numbness on her pinky on the left and on right side wrist.  Both of these have spread as to the numbness and pain  Please advise [phone]  [place]

## 2019-06-29 NOTE — Telephone Encounter (Signed)
I'd recommend she see an orthopedist. The fastest way to do this would be to go to [facility] in hours. I am happy to place referral for insurance purposes if needed or if she wants [place] to schedule appointment. If she needs another provider, let me know.

## 2019-06-29 NOTE — Telephone Encounter (Signed)
Patient advised as below. Patient agreed on ortho referral.

## 2019-09-28 ENCOUNTER — Telehealth: Payer: Self-pay

## 2019-09-28 NOTE — Telephone Encounter (Signed)
Pt calling; premarin is not working for her; causes severe H/As, dry mouth.  Would like to try Effexor to help c hot flashes and menopausal sxs.

## 2019-09-28 NOTE — Telephone Encounter (Signed)
Please advise 

## 2019-10-04 NOTE — Telephone Encounter (Signed)
Patient following up, has not heard back since she left message on 12/20, and didn't know if she needed an appointment to talk about changing medication. Please advise.

## 2020-03-27 ENCOUNTER — Ambulatory Visit: Payer: Self-pay | Attending: Internal Medicine

## 2020-03-27 ENCOUNTER — Ambulatory Visit: Payer: No Typology Code available for payment source | Attending: Internal Medicine

## 2020-03-27 DIAGNOSIS — Z23 Encounter for immunization: Secondary | ICD-10-CM

## 2020-03-27 NOTE — Progress Notes (Signed)
   Covid-19 Vaccination Clinic  Name:  Brandy Ballard    MRN: [id number] DOB: May 02, 1966  03/27/2020  Ms. Brandy Ballard was observed post Covid-19 immunization for 15 minutes without incident. She was provided with Vaccine Information Sheet and instruction to access the V-Safe system.   Brandy Ballard was instructed to call 911 with any severe reactions post vaccine: Marland Kitchen Difficulty breathing  . Swelling of face and throat  . A fast heartbeat  . A bad rash all over body  . Dizziness and weakness   Immunizations Administered    Name Date Dose VIS Date Route   Pfizer COVID-19 Vaccine 03/27/2020  9:07 AM 0.3 mL 11/23/2018 Intramuscular   Manufacturer: [place]   Lot: [id number]   [place]: [address]-2

## 2020-04-17 ENCOUNTER — Ambulatory Visit: Payer: No Typology Code available for payment source | Attending: Internal Medicine

## 2020-04-17 ENCOUNTER — Other Ambulatory Visit: Payer: Self-pay

## 2020-04-17 DIAGNOSIS — Z23 Encounter for immunization: Secondary | ICD-10-CM

## 2020-04-17 NOTE — Progress Notes (Signed)
   Covid-19 Vaccination Clinic  Name:  Brandy Ballard    MRN: [id number] DOB: 10-25-1965  04/17/2020  [name] was observed post Covid-19 immunization for 15 minutes without incident. She was provided with Vaccine Information Sheet and instruction to access the V-Safe system.   [name] was instructed to call 911 with any severe reactions post vaccine: Marland Kitchen Difficulty breathing  . Swelling of face and throat  . A fast heartbeat  . A bad rash all over body  . Dizziness and weakness   Immunizations Administered    Name Date Dose VIS Date Route   Pfizer COVID-19 Vaccine 04/17/2020  9:01 AM 0.3 mL 11/23/2018 Intramuscular   Manufacturer: [place]   Lot: [id number]   [place]: [address]-3

## 2020-10-10 ENCOUNTER — Other Ambulatory Visit: Payer: Self-pay | Admitting: Physician Assistant

## 2020-10-10 ENCOUNTER — Encounter: Payer: Self-pay | Admitting: Physician Assistant

## 2020-10-10 ENCOUNTER — Ambulatory Visit (INDEPENDENT_AMBULATORY_CARE_PROVIDER_SITE_OTHER): Payer: No Typology Code available for payment source | Admitting: Physician Assistant

## 2020-10-10 ENCOUNTER — Other Ambulatory Visit: Payer: Self-pay

## 2020-10-10 VITALS — BP 140/91 | HR 101 | Temp 98.8°F | Wt 242.2 lb

## 2020-10-10 DIAGNOSIS — R21 Rash and other nonspecific skin eruption: Secondary | ICD-10-CM

## 2020-10-10 DIAGNOSIS — B009 Herpesviral infection, unspecified: Secondary | ICD-10-CM

## 2020-10-10 MED ORDER — VALACYCLOVIR HCL 1 G PO TABS: 1000.0000 mg | ORAL_TABLET | Freq: Three times a day (TID) | ORAL | 0 refills | Status: DC

## 2020-10-10 MED ORDER — MUPIROCIN 2 % EX OINT: 1.0000 "application " | TOPICAL_OINTMENT | Freq: Two times a day (BID) | CUTANEOUS | 0 refills | Status: DC

## 2020-10-10 NOTE — Progress Notes (Signed)
Established patient visit   Patient: Brandy Ballard   DOB: [birth year]   55 y.o. Female  MRN: [id number] Visit Date: 10/10/2020  Today's healthcare provider: Trinna Post, PA-C   Chief Complaint  Patient presents with  . Rash  I,Porsha C McClurkin,acting as a scribe for Trinna Post, PA-C.,have documented all relevant documentation on the behalf of Trinna Post, PA-C,as directed by  Trinna Post, PA-C while in the presence of Trinna Post, PA-C.  Subjective    Rash This is a new problem. The current episode started in the past 7 days. The problem has been gradually worsening since onset. Location: nose. The rash is characterized by itchiness, draining, blistering, pain, redness and swelling. Pertinent negatives include no congestion, cough, diarrhea, fatigue, fever, shortness of breath or sore throat. Past treatments include anti-itch cream and antibiotics. The treatment provided no relief.    She has a blistery rash on her left nostril. It happened suddenly. She reports it had clear blisters which have since ruptured.      Medications: Outpatient Medications Prior to Visit  Medication Sig  . omeprazole (PRILOSEC) 40 MG capsule Take 40 mg by mouth daily.  Marland Kitchen acetaminophen (TYLENOL) 500 MG tablet Take 1,000 mg by mouth daily.   . cetirizine (ZYRTEC) 10 MG tablet Take 10 mg by mouth daily.  . ciprofloxacin-dexamethasone (CIPRODEX) OTIC suspension Place 4 drops into the left ear 2 (two) times daily.  . cyclobenzaprine (FLEXERIL) 5 MG tablet Take 1 tablet (5 mg total) by mouth 3 (three) times daily as needed for muscle spasms.  Marland Kitchen estradiol (CLIMARA - DOSED IN MG/24 HR) 0.025 mg/24hr patch Place 1 patch (0.025 mg total) onto the skin once a week.  . estrogens, conjugated, (PREMARIN) 0.3 MG tablet Take 1 tablet (0.3 mg total) by mouth daily.  . Fluticasone Propionate (XHANCE) 93 MCG/ACT EXHU Place into the nose daily as needed.  . traMADol (ULTRAM) 50 MG tablet Take 1  tablet (50 mg total) by mouth every 12 (twelve) hours as needed.   No facility-administered medications prior to visit.    Review of Systems  Constitutional: Negative for fatigue and fever.  HENT: Negative for congestion and sore throat.   Respiratory: Negative for cough and shortness of breath.   Gastrointestinal: Negative for diarrhea.  Skin: Positive for rash.       Objective    BP (!) 140/91 (BP Location: Right Arm, Patient Position: Sitting, Cuff Size: Large)   Pulse (!) 101   Temp 98.8 F (37.1 C) (Oral)   Wt 242 lb 3.2 oz (109.9 kg)   SpO2 98%   BMI 39.09 kg/m    Physical Exam Constitutional:      Appearance: Normal appearance.  HENT:     Head:   Skin:    General: Skin is warm and dry.  Neurological:     General: No focal deficit present.     Mental Status: She is alert and oriented to person, place, and time.  Psychiatric:        Mood and Affect: Mood normal.        Behavior: Behavior normal.       No results found for any visits on 10/10/20.  Assessment & Plan    1. Herpetic lesion  - valACYclovir (VALTREX) 1000 MG tablet; Take 1 tablet (1,000 mg total) by mouth 3 (three) times daily for 7 days.  Dispense: 21 tablet; Refill: 0 - mupirocin ointment (BACTROBAN) 2 %; Apply  1 application topically 2 (two) times daily.  Dispense: 22 g; Refill: 0  2. Rash  - mupirocin ointment (BACTROBAN) 2 %; Apply 1 application topically 2 (two) times daily.  Dispense: 22 g; Refill: 0   No follow-ups on file.      ITrinna Post, PA-C, have reviewed all documentation for this visit. The documentation on 10/10/20 for the exam, diagnosis, procedures, and orders are all accurate and complete.  The entirety of the information documented in the History of Present Illness, Review of Systems and Physical Exam were personally obtained by me. Portions of this information were initially documented by [facility] and reviewed by me for thoroughness and accuracy.      Paulene Floor  [facility] [phone] (phone) [phone] (fax)  [place]

## 2020-10-16 ENCOUNTER — Telehealth: Payer: No Typology Code available for payment source | Admitting: Nurse Practitioner

## 2020-10-16 ENCOUNTER — Telehealth: Payer: Self-pay

## 2020-10-16 ENCOUNTER — Emergency Department
Admission: EM | Admit: 2020-10-16 | Discharge: 2020-10-16 | Disposition: A | Discharge status: Home / Self Care | Payer: No Typology Code available for payment source | Attending: Emergency Medicine | Admitting: Emergency Medicine

## 2020-10-16 ENCOUNTER — Other Ambulatory Visit: Payer: Self-pay | Admitting: Nurse Practitioner

## 2020-10-16 DIAGNOSIS — R509 Fever, unspecified: Secondary | ICD-10-CM | POA: Diagnosis not present

## 2020-10-16 DIAGNOSIS — R059 Cough, unspecified: Secondary | ICD-10-CM | POA: Diagnosis not present

## 2020-10-16 DIAGNOSIS — Z20822 Contact with and (suspected) exposure to covid-19: Secondary | ICD-10-CM

## 2020-10-16 DIAGNOSIS — Z5321 Procedure and treatment not carried out due to patient leaving prior to being seen by health care provider: Secondary | ICD-10-CM | POA: Diagnosis not present

## 2020-10-16 DIAGNOSIS — R5081 Fever presenting with conditions classified elsewhere: Secondary | ICD-10-CM | POA: Diagnosis not present

## 2020-10-16 DIAGNOSIS — M791 Myalgia, unspecified site: Secondary | ICD-10-CM | POA: Diagnosis not present

## 2020-10-16 DIAGNOSIS — R52 Pain, unspecified: Secondary | ICD-10-CM

## 2020-10-16 MED ORDER — BENZONATATE 100 MG PO CAPS: 100.0000 mg | ORAL_CAPSULE | Freq: Three times a day (TID) | ORAL | 0 refills | Status: DC | PRN

## 2020-10-16 MED ORDER — OSELTAMIVIR PHOSPHATE 75 MG PO CAPS: 75.0000 mg | ORAL_CAPSULE | Freq: Two times a day (BID) | ORAL | 0 refills | Status: DC

## 2020-10-16 NOTE — Addendum Note (Signed)
Addended by: Chevis Pretty on: 10/16/2020 07:10 PM   Modules accepted: Orders

## 2020-10-16 NOTE — Telephone Encounter (Signed)
Copied from [name] [id number]. Topic: General - Other >> Oct 16, 2020  8:43 AM Brandy Ballard D wrote: PT need a FLU test / please advise

## 2020-10-16 NOTE — Progress Notes (Signed)
E-Visit for Corona Virus Screening  Your current symptoms could be consistent with the coronavirus.  Many health care providers can now test patients at their office but not all are.  [facility] has multiple testing sites. For information on our COVID testing locations and hours go to [email or web address]    Testing Information: The COVID-19 Community Testing sites are testing BY APPOINTMENT ONLY.  You can schedule online at [email or web address]  If you do not have access to a smart phone or computer you may call [phone] for an appointment.   Additional testing sites in the Community:  . For [facility] Testing sites in [facility]  [email or web address]  . For Pop-up testing sites in [place]  [email or web address]  . For [facility] [email or web address]  . For [facility] testing in [place] and [employer] [email or web address]  . For [facility] testing in [facility]   [email or web address]  For  more information about community testing call [phone]   Please quarantine yourself while awaiting your test results. Please stay home for a minimum of 10 days from the first day of illness with improving symptoms and you have had 24 hours of no fever (without the use of Tylenol (Acetaminophen) Motrin (Ibuprofen) or any fever reducing medication).  Also - Do not get tested prior to returning to work because once you have had a positive test the test can stay positive for more than a month in some cases.   You should wear a mask or cloth face covering over your nose and mouth if you must be around other people or animals, including pets (even at home). Try  to stay at least 6 feet away from other people. This will protect the people around you.  Please continue good preventive care measures, including:  frequent hand-washing, avoid touching your face, cover coughs/sneezes, stay out of crowds and keep a 6 foot distance from others.  COVID-19 is a respiratory illness with symptoms that are similar to the flu. Symptoms are typically mild to moderate, but there have been cases of severe illness and death due to the virus.   The following symptoms may appear 2-14 days after exposure: . Fever . Cough . Shortness of breath or difficulty breathing . Chills . Repeated shaking with chills . Muscle pain . Headache . Sore throat . New loss of taste or smell . Fatigue . Congestion or runny nose . Nausea or vomiting . Diarrhea  Go to the nearest hospital ED for assessment if fever/cough/breathlessness are severe or illness seems like a threat to life.  It is vitally important that if you feel that you have an infection such as this virus or any other virus that you stay home and away from places where you may spread it to others.  You should avoid contact with people age 7 and older.   You can use medication: someone has already called in [place] for you.  You may also take acetaminophen (Tylenol) as needed for fever and body aches since you are allergic to NSAIDS.  Reduce your risk of any infection by using the same precautions used for avoiding the common cold or flu:  Marland Kitchen Wash your hands often with soap and warm water for at least 20 seconds.  If soap and water are not readily available, use an alcohol-based hand sanitizer with at least 60% alcohol.  . If coughing or sneezing, cover your mouth and nose by coughing or sneezing into the elbow areas of your shirt or coat, into  a tissue or into your sleeve (not your hands). . Avoid shaking hands with others and consider head nods or verbal greetings only. . Avoid touching your eyes, nose, or mouth  with unwashed hands.  . Avoid close contact with people who are sick. . Avoid places or events with large numbers of people in one location, like concerts or sporting events. . Carefully consider travel plans you have or are making. . If you are planning any travel outside or inside the [place], visit the CDC's Travelers' Health webpage for the latest health notices. . If you have some symptoms but not all symptoms, continue to monitor at home and seek medical attention if your symptoms worsen. . If you are having a medical emergency, call 911.  HOME CARE . Only take medications as instructed by your medical team. . Drink plenty of fluids and get plenty of rest. . A steam or ultrasonic humidifier can help if you have congestion.   GET HELP RIGHT AWAY IF YOU HAVE EMERGENCY WARNING SIGNS** FOR COVID-19. If you or someone is showing any of these signs seek emergency medical care immediately. Call 911 or proceed to your closest emergency facility if: . You develop worsening high fever. . Trouble breathing . Bluish lips or face . Persistent pain or pressure in the chest . New confusion . Inability to wake or stay awake . You cough up blood. . Your symptoms become more severe  **This list is not all possible symptoms. Contact your medical provider for any symptoms that are sever or concerning to you.  MAKE SURE YOU   Understand these instructions.  Will watch your condition.  Will get help right away if you are not doing well or get worse.  Your e-visit answers were reviewed by a board certified advanced clinical practitioner to complete your personal care plan.  Depending on the condition, your plan could have included both over the counter or prescription medications.  If there is a problem please reply once you have received a response from your provider.  Your safety is important to [place].  If you have drug allergies check your prescription carefully.    You can use MyChart to ask  questions about today's visit, request a non-urgent call back, or ask for a work or school excuse for 24 hours related to this e-Visit. If it has been greater than 24 hours you will need to follow up with your provider, or enter a new e-Visit to address those concerns. You will get an e-mail in the next two days asking about your experience.  I hope that your e-visit has been valuable and will speed your recovery. Thank you for using e-visits.   5-10 minutes spent reviewing and documenting in chart.

## 2020-10-18 NOTE — Telephone Encounter (Signed)
OV

## 2020-10-18 NOTE — Telephone Encounter (Signed)
Patient reports that she went to urgent care.

## 2020-11-29 ENCOUNTER — Telehealth: Payer: Self-pay

## 2020-11-29 DIAGNOSIS — R0981 Nasal congestion: Secondary | ICD-10-CM

## 2020-11-29 NOTE — Telephone Encounter (Signed)
Referral placed.

## 2020-11-29 NOTE — Addendum Note (Signed)
Addended by: Trinna Post on: 11/29/2020 05:06 PM   Modules accepted: Orders

## 2020-11-29 NOTE — Telephone Encounter (Signed)
Copied from [place] [id number]. Topic: Referral - Question >> Nov 29, 2020  4:14 PM Pawlus, Brayton Layman A wrote: Reason for CRM: Pt requested a referral for an ENT specialist - Roena Malady, M.D. Please advise.

## 2021-07-31 ENCOUNTER — Ambulatory Visit: Payer: No Typology Code available for payment source | Admitting: Family Medicine

## 2021-09-11 ENCOUNTER — Other Ambulatory Visit: Payer: Self-pay

## 2021-09-11 ENCOUNTER — Ambulatory Visit (INDEPENDENT_AMBULATORY_CARE_PROVIDER_SITE_OTHER): Payer: No Typology Code available for payment source | Admitting: Family Medicine

## 2021-09-11 ENCOUNTER — Encounter: Payer: Self-pay | Admitting: Family Medicine

## 2021-09-11 VITALS — BP 132/83 | HR 80 | Temp 98.1°F | Ht 66.0 in | Wt 228.4 lb

## 2021-09-11 DIAGNOSIS — M25562 Pain in left knee: Secondary | ICD-10-CM

## 2021-09-11 DIAGNOSIS — Z1329 Encounter for screening for other suspected endocrine disorder: Secondary | ICD-10-CM

## 2021-09-11 DIAGNOSIS — K219 Gastro-esophageal reflux disease without esophagitis: Secondary | ICD-10-CM | POA: Diagnosis not present

## 2021-09-11 DIAGNOSIS — Z1231 Encounter for screening mammogram for malignant neoplasm of breast: Secondary | ICD-10-CM

## 2021-09-11 DIAGNOSIS — G43909 Migraine, unspecified, not intractable, without status migrainosus: Secondary | ICD-10-CM

## 2021-09-11 DIAGNOSIS — Z8639 Personal history of other endocrine, nutritional and metabolic disease: Secondary | ICD-10-CM | POA: Diagnosis not present

## 2021-09-11 DIAGNOSIS — R42 Dizziness and giddiness: Secondary | ICD-10-CM

## 2021-09-11 DIAGNOSIS — G8929 Other chronic pain: Secondary | ICD-10-CM

## 2021-09-11 DIAGNOSIS — M48061 Spinal stenosis, lumbar region without neurogenic claudication: Secondary | ICD-10-CM | POA: Insufficient documentation

## 2021-09-11 DIAGNOSIS — Z8352 Family history of ear disorders: Secondary | ICD-10-CM

## 2021-09-11 DIAGNOSIS — Z23 Encounter for immunization: Secondary | ICD-10-CM

## 2021-09-11 DIAGNOSIS — M25512 Pain in left shoulder: Secondary | ICD-10-CM

## 2021-09-11 DIAGNOSIS — M67432 Ganglion, left wrist: Secondary | ICD-10-CM | POA: Diagnosis not present

## 2021-09-11 DIAGNOSIS — M25561 Pain in right knee: Secondary | ICD-10-CM

## 2021-09-11 DIAGNOSIS — Z720 Tobacco use: Secondary | ICD-10-CM

## 2021-09-11 DIAGNOSIS — Z1159 Encounter for screening for other viral diseases: Secondary | ICD-10-CM

## 2021-09-11 DIAGNOSIS — M722 Plantar fascial fibromatosis: Secondary | ICD-10-CM

## 2021-09-11 DIAGNOSIS — M4726 Other spondylosis with radiculopathy, lumbar region: Secondary | ICD-10-CM | POA: Insufficient documentation

## 2021-09-11 DIAGNOSIS — J349 Unspecified disorder of nose and nasal sinuses: Secondary | ICD-10-CM

## 2021-09-11 DIAGNOSIS — Z13220 Encounter for screening for lipoid disorders: Secondary | ICD-10-CM

## 2021-09-11 MED ORDER — MOMETASONE FUROATE 50 MCG/ACT NA SUSP
2.0000 | Freq: Every day | NASAL | 12 refills | Status: AC
  Filled 2021-09-11: qty 17, 30d supply, fill #0

## 2021-09-11 NOTE — Progress Notes (Signed)
BP 132/83    Pulse 80    Temp 98.1 F (36.7 C)    Ht '5\' 6"'  (1.676 m)    Wt 228 lb 6.4 oz (103.6 kg)    SpO2 96%    BMI 36.86 kg/m    Subjective:    Patient ID: Brandy Ballard, female    DOB: [birth year], 55 y.o.   MRN: [id number]  HPI: Brandy Ballard is a 55 y.o. female who presents today to establish care. She had been seeing another PCP until about a year ago.   Chief Complaint  Patient presents with   Establish Care    Patient here to establish care.   Back Pain    Patient states she had seen ortho years ago and would like to go back and see one for back pain    Migraine    Patient states her mother has meniere's disease and she has concerns for it due to having optical migraines and trouble driving   Has been having some issues with her L shoulder.She has been having a tearing burning feeling on her L shoulder. Has been going on for several years. Has been getting worse. Lifting makes it worse, tylenol makes it better. Has had issues for years, but has been ignoring it. Will occasionally radiate into her neck and into her arm.   Has been having issues with her back for more than 3 years. She was supposed to have surgery, but held off on it. She has done PT, did some injections, but notes that it has been progressively getting worse. She had an MRI done in 2013 which already showed stenosis and she notes that it has gotten progressively worse.   MIGRAINES Duration: chronic Onset: sudden Severity: severe Quality: sharp Radiation: no Time of day headache occurs: at random Headache status at time of visit: asymptomatic Aura: yes Nausea:  yes Vomiting: yes Photophobia:  yes Phonophobia:  yes Effect on social functioning:  yes Confusion:  no Gait disturbance/ataxia:  no Behavioral changes:  no Fevers:  no  DIZZINESS- Has been having issues with her eyes focusing while she was driving.  Duration:  about 3 years Description of symptoms: changes in vision, maybe dizziness Duration  of episode: hours Dizziness frequency: with driving Provoking factors: driving Aggravating factors:  driving Triggered by rolling over in bed: no Triggered by bending over: no Aggravated by head movement: no Aggravated by exertion, coughing, loud noises: no Recent head injury: no Recent or current viral symptoms: no History of vasovagal episodes: no Nausea: yes Vomiting: no Tinnitus: yes Hearing loss: yes Aural fullness: yes Headache: yes Photophobia/phonophobia: yes Unsteady gait: no Postural instability: no Diplopia, dysarthria, dysphagia or weakness: no Related to exertion: no Pallor: no Diaphoresis: no Dyspnea: no Chest pain: no  HASHIMOTOS Thyroid control status:uncontrolled Satisfied with current treatment? no Medication side effects: not on anything Fatigue: yes Cold intolerance: yes Heat intolerance: yes Weight gain: no Weight loss: yes Constipation: yes Diarrhea/loose stools: no Palpitations: no Lower extremity edema: no Anxiety/depressed mood: yes  Has been doing FODMAP diet which has helped with her diarrhea and constipation.   When she had her sinus surgery she had a lot of cysts in her sinuses. She had been on a steroid sinus spray. She notes that she had to stop it as it made her headaches worse.   Active Ambulatory Problems    Diagnosis Date Noted   Tobacco abuse 08/18/2018   Gastroesophageal reflux disease    History of Hashimoto thyroiditis  09/11/2021   Osteoarthritis of spine with radiculopathy, lumbar region 09/11/2021   Spinal stenosis of lumbar region 09/11/2021   Migraine without status migrainosus, not intractable 09/12/2021   Sinus disease 09/12/2021   Resolved Ambulatory Problems    Diagnosis Date Noted   Encounter for screening colonoscopy    Benign neoplasm of cecum    Polyp of sigmoid colon    Gastric polyp    Abdominal pain, epigastric    Past Medical History:  Diagnosis Date   Allergy    Anemia    Arthritis    Blood  transfusion without reported diagnosis    Dental bridge present    GERD (gastroesophageal reflux disease)    Hashimoto's disease    Polyp of nasal sinus    PONV (postoperative nausea and vomiting)    Thyroid disease    Past Surgical History:  Procedure Laterality Date   COLONOSCOPY WITH PROPOFOL N/A 07/19/2018   Procedure: COLONOSCOPY WITH Biopsies;  Surgeon: Lucilla Lame, MD;  Location: [facility];  Service: Endoscopy;  Laterality: N/A;   ESOPHAGOGASTRODUODENOSCOPY (EGD) WITH PROPOFOL N/A 11/09/2018   Procedure: ESOPHAGOGASTRODUODENOSCOPY (EGD) WITH BIOPSIES;  Surgeon: Virgel Manifold, MD;  Location: [facility];  Service: Endoscopy;  Laterality: N/A;   FRONTAL SINUS EXPLORATION Bilateral 10/05/2018   Procedure: FRONTAL SINUS EXPLORATION;  Surgeon: Beverly Gust, MD;  Location: [facility];  Service: ENT;  Laterality: Bilateral;   IMAGE GUIDED SINUS SURGERY N/A 10/05/2018   Procedure: IMAGE GUIDED SINUS SURGERY;  Surgeon: Beverly Gust, MD;  Location: [facility];  Service: ENT;  Laterality: N/A;   NASAL TURBINATE REDUCTION  10/05/2018   Procedure: TURBINATE REDUCTION/SUBMUCOSAL RESECTION;  Surgeon: Beverly Gust, MD;  Location: [facility];  Service: ENT;;   OVARIAN CYST REMOVAL     POLYPECTOMY N/A 07/19/2018   Procedure: POLYPECTOMY;  Surgeon: Lucilla Lame, MD;  Location: [facility];  Service: Endoscopy;  Laterality: N/A;   SEPTOPLASTY WITH ETHMOIDECTOMY, AND MAXILLARY ANTROSTOMY Bilateral 10/05/2018   Procedure: SEPTOPLASTY WITH TOTAL ETHMOIDECTOMY, AND MAXILLARY ANTROSTOMY WITH TISSUE REMOVAL;  Surgeon: Beverly Gust, MD;  Location: [facility];  Service: ENT;  Laterality: Bilateral;   SPHENOIDECTOMY  10/05/2018   Procedure: TOTAL SPHENOIDECTOMY;  Surgeon: Beverly Gust, MD;  Location: [facility];  Service: ENT;;   TOTAL ABDOMINAL HYSTERECTOMY     Outpatient Encounter Medications as of 09/11/2021  Medication Sig   acetaminophen (TYLENOL) 500 MG  tablet Take 1,000 mg by mouth daily.    mometasone (NASONEX) 50 MCG/ACT nasal spray Place 2 sprays into the nose daily.   [DISCONTINUED] atorvastatin (LIPITOR) 20 MG tablet Take 1 tablet (20 mg total) by mouth daily.   [DISCONTINUED] benzonatate (TESSALON) 100 MG capsule TAKE 1 CAPSULE BY MOUTH 3 TIMES DAILY AS NEEDED. (Patient not taking: Reported on 09/11/2021)   [DISCONTINUED] cetirizine (ZYRTEC) 10 MG tablet Take 10 mg by mouth daily.   [DISCONTINUED] ciprofloxacin-dexamethasone (CIPRODEX) OTIC suspension Place 4 drops into the left ear 2 (two) times daily.   [DISCONTINUED] cyclobenzaprine (FLEXERIL) 5 MG tablet Take 1 tablet (5 mg total) by mouth 3 (three) times daily as needed for muscle spasms.   [DISCONTINUED] estradiol (CLIMARA - DOSED IN MG/24 HR) 0.025 mg/24hr patch Place 1 patch (0.025 mg total) onto the skin once a week.   [DISCONTINUED] estrogens, conjugated, (PREMARIN) 0.3 MG tablet Take 1 tablet (0.3 mg total) by mouth daily.   [DISCONTINUED] Fluticasone Propionate (XHANCE) 93 MCG/ACT EXHU Place into the nose daily as needed.   [DISCONTINUED] mupirocin ointment (BACTROBAN) 2 %  APPLY TO THE AFFECTED AREA(S) TWO TIMES DAILY (Patient not taking: Reported on 09/11/2021)   [DISCONTINUED] omeprazole (PRILOSEC) 40 MG capsule Take 40 mg by mouth daily. (Patient not taking: Reported on 09/11/2021)   [DISCONTINUED] oseltamivir (TAMIFLU) 75 MG capsule TAKE 1 CAPSULE (75 MG TOTAL) BY MOUTH 2 (TWO) TIMES DAILY. (Patient not taking: Reported on 09/11/2021)   [DISCONTINUED] traMADol (ULTRAM) 50 MG tablet Take 1 tablet (50 mg total) by mouth every 12 (twelve) hours as needed.   [DISCONTINUED] valACYclovir (VALTREX) 1000 MG tablet TAKE 1 TABLET BY MOUTH 3 (THREE) TIMES DAILY FOR 7 DAYS (Patient not taking: Reported on 09/11/2021)   No facility-administered encounter medications on file as of 09/11/2021.   Allergies  Allergen Reactions   Nsaids     Avoids due to Hx Peptic ulcers   Clearasil Daily  Clear Acne [Benzoyl Peroxide] Rash   Family History  Problem Relation Age of Onset   Meniere's disease Mother    Hypertension Father    Atrial fibrillation Father    CVA Father    ADD / ADHD Daughter    Polycystic ovary syndrome Daughter    Breast cancer Paternal 101        over 31   Breast cancer Paternal Grandmother 54   Heart attack Paternal Grandfather    CVA Paternal Grandfather    Social History   Socioeconomic History   Marital status: Married    Spouse name: Not on file   Number of children: Not on file   Years of education: Not on file   Highest education level: Not on file  Occupational History   Not on file  Tobacco Use   Smoking status: Former    Packs/day: 0.25    Years: 20.00    Pack years: 5.00    Types: Cigarettes    Quit date: 11/29/2018    Years since quitting: 2.7   Smokeless tobacco: Never   Tobacco comments:    down to 2-4 daily  Vaping Use   Vaping Use: Never used  Substance and Sexual Activity   Alcohol use: Not Currently   Drug use: Never   Sexual activity: Yes    Birth control/protection: Surgical  Other Topics Concern   Not on file  Social History Narrative   Not on file   Social Determinants of Health   Financial Resource Strain: Not on file  Food Insecurity: Not on file  Transportation Needs: Not on file  Physical Activity: Not on file  Stress: Not on file  Social Connections: Not on file    Review of Systems  Constitutional: Negative.   HENT:  Positive for tinnitus. Negative for congestion, dental problem, drooling, ear discharge, ear pain, facial swelling, hearing loss, mouth sores, nosebleeds, postnasal drip, rhinorrhea, sinus pressure, sinus pain, sneezing, sore throat, trouble swallowing and voice change.   Eyes:  Positive for visual disturbance. Negative for photophobia, pain, discharge, redness and itching.  Respiratory: Negative.    Cardiovascular: Negative.   Gastrointestinal: Negative.   Musculoskeletal:  Positive  for arthralgias and myalgias. Negative for back pain, gait problem, joint swelling, neck pain and neck stiffness.  Skin: Negative.   Neurological: Negative.   Psychiatric/Behavioral: Negative.     Per HPI unless specifically indicated above     Objective:    BP 132/83    Pulse 80    Temp 98.1 F (36.7 C)    Ht '5\' 6"'  (1.676 m)    Wt 228 lb 6.4 oz (103.6 kg)  SpO2 96%    BMI 36.86 kg/m   Wt Readings from Last 3 Encounters:  09/11/21 228 lb 6.4 oz (103.6 kg)  10/10/20 242 lb 3.2 oz (109.9 kg)  06/09/19 226 lb (102.5 kg)    Physical Exam Vitals and nursing note reviewed.  Constitutional:      General: She is not in acute distress.    Appearance: Normal appearance. She is not ill-appearing, toxic-appearing or diaphoretic.  HENT:     Head: Normocephalic and atraumatic.     Right Ear: External ear normal.     Left Ear: External ear normal.     Nose: Nose normal.     Mouth/Throat:     Mouth: Mucous membranes are moist.     Pharynx: Oropharynx is clear.  Eyes:     General: No scleral icterus.       Right eye: No discharge.        Left eye: No discharge.     Extraocular Movements: Extraocular movements intact.     Conjunctiva/sclera: Conjunctivae normal.     Pupils: Pupils are equal, round, and reactive to light.  Cardiovascular:     Rate and Rhythm: Normal rate and regular rhythm.     Pulses: Normal pulses.     Heart sounds: Normal heart sounds. No murmur heard.   No friction rub. No gallop.  Pulmonary:     Effort: Pulmonary effort is normal. No respiratory distress.     Breath sounds: Normal breath sounds. No stridor. No wheezing, rhonchi or rales.  Chest:     Chest wall: No tenderness.  Musculoskeletal:        General: Normal range of motion.     Cervical back: Normal range of motion and neck supple.  Skin:    General: Skin is warm and dry.     Capillary Refill: Capillary refill takes less than 2 seconds.     Coloration: Skin is not jaundiced or pale.     Findings:  No bruising, erythema, lesion or rash.  Neurological:     General: No focal deficit present.     Mental Status: She is alert and oriented to person, place, and time. Mental status is at baseline.  Psychiatric:        Mood and Affect: Mood normal.        Behavior: Behavior normal.        Thought Content: Thought content normal.        Judgment: Judgment normal.    Results for orders placed or performed in visit on 09/11/21  CBC with Differential/Platelet  Result Value Ref Range   WBC 8.5 3.4 - 10.8 x10E3/uL   RBC 4.61 3.77 - 5.28 x10E6/uL   Hemoglobin 14.7 11.1 - 15.9 g/dL   Hematocrit 43.2 34.0 - 46.6 %   MCV 94 79 - 97 fL   MCH 31.9 26.6 - 33.0 pg   MCHC 34.0 31.5 - 35.7 g/dL   RDW 12.8 11.7 - 15.4 %   Platelets 340 150 - 450 x10E3/uL   Neutrophils 57 Not Estab. %   Lymphs 24 Not Estab. %   Monocytes 7 Not Estab. %   Eos 10 Not Estab. %   Basos 1 Not Estab. %   Neutrophils Absolute 4.9 1.4 - 7.0 x10E3/uL   Lymphocytes Absolute 2.0 0.7 - 3.1 x10E3/uL   Monocytes Absolute 0.6 0.1 - 0.9 x10E3/uL   EOS (ABSOLUTE) 0.8 (H) 0.0 - 0.4 x10E3/uL   Basophils Absolute 0.1 0.0 - 0.2 x10E3/uL  Immature Granulocytes 1 Not Estab. %   Immature Grans (Abs) 0.0 0.0 - 0.1 x10E3/uL  Comprehensive metabolic panel  Result Value Ref Range   Glucose 108 (H) 70 - 99 mg/dL   BUN 15 6 - 24 mg/dL   Creatinine, Ser 0.67 0.57 - 1.00 mg/dL   eGFR 104 >59 mL/min/1.73   BUN/Creatinine Ratio 22 9 - 23   Sodium 139 134 - 144 mmol/L   Potassium 4.4 3.5 - 5.2 mmol/L   Chloride 101 96 - 106 mmol/L   CO2 22 20 - 29 mmol/L   Calcium 9.1 8.7 - 10.2 mg/dL   Total Protein 7.0 6.0 - 8.5 g/dL   Albumin 4.5 3.8 - 4.9 g/dL   Globulin, Total 2.5 1.5 - 4.5 g/dL   Albumin/Globulin Ratio 1.8 1.2 - 2.2   Bilirubin Total <0.2 0.0 - 1.2 mg/dL   Alkaline Phosphatase 72 44 - 121 IU/L   AST 18 0 - 40 IU/L   ALT 36 (H) 0 - 32 IU/L  Lipid Panel w/o Chol/HDL Ratio  Result Value Ref Range   Cholesterol, Total 279 (H)  100 - 199 mg/dL   Triglycerides 247 (H) 0 - 149 mg/dL   HDL 44 >39 mg/dL   VLDL Cholesterol Cal 48 (H) 5 - 40 mg/dL   LDL Chol Calc (NIH) 187 (H) 0 - 99 mg/dL  Urinalysis, Routine w reflex microscopic  Result Value Ref Range   Specific Gravity, UA 1.017 1.005 - 1.030   pH, UA 5.5 5.0 - 7.5   Color, UA Yellow Yellow   Appearance Ur Clear Clear   Leukocytes,UA Negative Negative   Protein,UA Negative Negative/Trace   Glucose, UA Negative Negative   Ketones, UA Negative Negative   RBC, UA Negative Negative   Bilirubin, UA Negative Negative   Urobilinogen, Ur 0.2 0.2 - 1.0 mg/dL   Nitrite, UA Negative Negative   Microscopic Examination Comment   Hepatitis C Antibody  Result Value Ref Range   Hep C Virus Ab <0.1 0.0 - 0.9 s/co ratio  Thyroid Panel With TSH  Result Value Ref Range   TSH WILL FOLLOW    T4, Total WILL FOLLOW    T3 Uptake Ratio WILL FOLLOW    Free Thyroxine Index WILL FOLLOW       Assessment & Plan:   Problem List Items Addressed This Visit       Cardiovascular and Mediastinum   Migraine without status migrainosus, not intractable    Will get her into neurology. Given issues with the driving and eye focusing, may also need to see neuro-opthlamology. Eye exam normal at ophthalmology. Continue to monitor. Call with any concerns.       Relevant Orders   Ambulatory referral to Neurology   Ambulatory referral to Neurology     Respiratory   Sinus disease    Will start nasonex. May need CT of sinuses in the future. Continue to monitor.         Digestive   Gastroesophageal reflux disease    Under good control on current regimen. Continue current regimen. Continue to monitor. Call with any concerns.        Relevant Orders   CBC with Differential/Platelet (Completed)   Comprehensive metabolic panel (Completed)     Nervous and Auditory   Osteoarthritis of spine with radiculopathy, lumbar region    Chronic. Referral to neurosurgery made today. Call with any  concerns. Continue to monitor.       Relevant Orders   Ambulatory  referral to Neurosurgery     Other   Tobacco abuse   Relevant Orders   Urinalysis, Routine w reflex microscopic (Completed)   History of Hashimoto thyroiditis - Primary    Rechecking labs today. Await results. Treat as needed.       Relevant Orders   Comprehensive metabolic panel (Completed)   Spinal stenosis of lumbar region    Chronic. Referral to neurosurgery made today. Call with any concerns. Continue to monitor.       Relevant Orders   Ambulatory referral to Neurosurgery   Other Visit Diagnoses     Ganglion cyst of dorsum of left wrist       Referral to ortho placed today.    Chronic pain of both knees       Referral to orthopedics made today. Await their input.    Relevant Orders   Ambulatory referral to Orthopedic Surgery   Chronic left shoulder pain       Referral to orthopedics made today. Await their input.    Relevant Orders   Ambulatory referral to Orthopedic Surgery   Plantar fascial fibromatosis       Referral to podiatry made today. Await their input.    Relevant Orders   Ambulatory referral to Podiatry   Dizziness       Referral to neurology made today. Await their input.    Relevant Orders   Ambulatory referral to Neurology   Ambulatory referral to Neurology   Family history of Meniere's disease       Referral to neurology made today. Await their input.    Screening for cholesterol level       Labs drawn today. Await results. Treat as needed.    Relevant Orders   Lipid Panel w/o Chol/HDL Ratio (Completed)   Need for hepatitis C screening test       Labs drawn today. Await results. Treat as needed.    Relevant Orders   Hepatitis C Antibody (Completed)   Need for shingles vaccine       Labs drawn today. Await results. Treat as needed.    Relevant Orders   Varicella-zoster vaccine IM (Shingrix) (Completed)   Encounter for screening mammogram for malignant neoplasm of breast        Mammogram ordered today.    Relevant Orders   MM DIAG BREAST TOMO BILATERAL   Screening for thyroid disorder       Labs drawn today. Await results. Treat as needed.    Relevant Orders   Thyroid Panel With TSH        Follow up plan: Return As able for physical.

## 2021-09-12 ENCOUNTER — Encounter: Payer: Self-pay | Admitting: Family Medicine

## 2021-09-12 DIAGNOSIS — G43909 Migraine, unspecified, not intractable, without status migrainosus: Secondary | ICD-10-CM | POA: Insufficient documentation

## 2021-09-12 DIAGNOSIS — J349 Unspecified disorder of nose and nasal sinuses: Secondary | ICD-10-CM | POA: Insufficient documentation

## 2021-09-12 LAB — URINALYSIS, ROUTINE W REFLEX MICROSCOPIC
Bilirubin, UA: NEGATIVE
Glucose, UA: NEGATIVE
Ketones, UA: NEGATIVE
Leukocytes,UA: NEGATIVE
Nitrite, UA: NEGATIVE
Protein,UA: NEGATIVE
RBC, UA: NEGATIVE
Specific Gravity, UA: 1.017 (ref 1.005–1.030)
Urobilinogen, Ur: 0.2 mg/dL (ref 0.2–1.0)
pH, UA: 5.5 (ref 5.0–7.5)

## 2021-09-12 NOTE — Assessment & Plan Note (Signed)
Rechecking labs today. Await results. Treat as needed.  °

## 2021-09-12 NOTE — Assessment & Plan Note (Signed)
Chronic. Referral to neurosurgery made today. Call with any concerns. Continue to monitor.

## 2021-09-12 NOTE — Assessment & Plan Note (Signed)
Will get her into neurology. Given issues with the driving and eye focusing, may also need to see neuro-opthlamology. Eye exam normal at ophthalmology. Continue to monitor. Call with any concerns.

## 2021-09-12 NOTE — Assessment & Plan Note (Signed)
Will start nasonex. May need CT of sinuses in the future. Continue to monitor.

## 2021-09-12 NOTE — Assessment & Plan Note (Signed)
Under good control on current regimen. Continue current regimen. Continue to monitor. Call with any concerns.   

## 2021-09-14 LAB — CBC WITH DIFFERENTIAL/PLATELET
Basophils Absolute: 0.1 10*3/uL (ref 0.0–0.2)
Basos: 1 %
EOS (ABSOLUTE): 0.8 10*3/uL — ABNORMAL HIGH (ref 0.0–0.4)
Eos: 10 %
Hematocrit: 43.2 % (ref 34.0–46.6)
Hemoglobin: 14.7 g/dL (ref 11.1–15.9)
Immature Grans (Abs): 0 10*3/uL (ref 0.0–0.1)
Immature Granulocytes: 1 %
Lymphocytes Absolute: 2 10*3/uL (ref 0.7–3.1)
Lymphs: 24 %
MCH: 31.9 pg (ref 26.6–33.0)
MCHC: 34 g/dL (ref 31.5–35.7)
MCV: 94 fL (ref 79–97)
Monocytes Absolute: 0.6 10*3/uL (ref 0.1–0.9)
Monocytes: 7 %
Neutrophils Absolute: 4.9 10*3/uL (ref 1.4–7.0)
Neutrophils: 57 %
Platelets: 340 10*3/uL (ref 150–450)
RBC: 4.61 x10E6/uL (ref 3.77–5.28)
RDW: 12.8 % (ref 11.7–15.4)
WBC: 8.5 10*3/uL (ref 3.4–10.8)

## 2021-09-14 LAB — COMPREHENSIVE METABOLIC PANEL
ALT: 36 IU/L — ABNORMAL HIGH (ref 0–32)
AST: 18 IU/L (ref 0–40)
Albumin/Globulin Ratio: 1.8 (ref 1.2–2.2)
Albumin: 4.5 g/dL (ref 3.8–4.9)
Alkaline Phosphatase: 72 IU/L (ref 44–121)
BUN/Creatinine Ratio: 22 (ref 9–23)
BUN: 15 mg/dL (ref 6–24)
Bilirubin Total: <0.2 mg/dL (ref 0.0–1.2)
CO2: 22 mmol/L (ref 20–29)
Calcium: 9.1 mg/dL (ref 8.7–10.2)
Chloride: 101 mmol/L (ref 96–106)
Creatinine, Ser: 0.67 mg/dL (ref 0.57–1.00)
Globulin, Total: 2.5 g/dL (ref 1.5–4.5)
Glucose: 108 mg/dL — ABNORMAL HIGH (ref 70–99)
Potassium: 4.4 mmol/L (ref 3.5–5.2)
Sodium: 139 mmol/L (ref 134–144)
Total Protein: 7 g/dL (ref 6.0–8.5)
eGFR: 104 mL/min/{1.73_m2} (ref >59)

## 2021-09-14 LAB — HEPATITIS C ANTIBODY: Hep C Virus Ab: <0.1 s/co ratio (ref 0.0–0.9)

## 2021-09-14 LAB — LIPID PANEL W/O CHOL/HDL RATIO
Cholesterol, Total: 279 mg/dL — ABNORMAL HIGH (ref 100–199)
HDL: 44 mg/dL (ref >39)
LDL Chol Calc (NIH): 187 mg/dL — ABNORMAL HIGH (ref 0–99)
Triglycerides: 247 mg/dL — ABNORMAL HIGH (ref 0–149)
VLDL Cholesterol Cal: 48 mg/dL — ABNORMAL HIGH (ref 5–40)

## 2021-09-14 LAB — THYROID PANEL WITH TSH
Free Thyroxine Index: 1.9 (ref 1.2–4.9)
T3 Uptake Ratio: 27 % (ref 24–39)
T4, Total: 7.1 ug/dL (ref 4.5–12.0)
TSH: 3.88 u[IU]/mL (ref 0.450–4.500)

## 2021-09-17 ENCOUNTER — Other Ambulatory Visit: Payer: Self-pay

## 2021-09-17 ENCOUNTER — Encounter: Payer: Self-pay | Admitting: Family Medicine

## 2021-09-17 DIAGNOSIS — E785 Hyperlipidemia, unspecified: Secondary | ICD-10-CM | POA: Insufficient documentation

## 2021-09-17 MED ORDER — CELECOXIB 200 MG PO CAPS
ORAL_CAPSULE | ORAL | 0 refills | Status: DC
  Filled 2021-09-17: qty 60, 60d supply, fill #0

## 2021-09-18 ENCOUNTER — Ambulatory Visit: Payer: No Typology Code available for payment source | Admitting: Family Medicine

## 2021-09-25 ENCOUNTER — Other Ambulatory Visit: Payer: Self-pay

## 2021-10-01 ENCOUNTER — Ambulatory Visit (INDEPENDENT_AMBULATORY_CARE_PROVIDER_SITE_OTHER): Payer: No Typology Code available for payment source

## 2021-10-01 ENCOUNTER — Encounter: Payer: Self-pay | Admitting: Podiatry

## 2021-10-01 ENCOUNTER — Ambulatory Visit (INDEPENDENT_AMBULATORY_CARE_PROVIDER_SITE_OTHER): Payer: No Typology Code available for payment source | Admitting: Podiatry

## 2021-10-01 ENCOUNTER — Other Ambulatory Visit: Payer: Self-pay

## 2021-10-01 DIAGNOSIS — M7662 Achilles tendinitis, left leg: Secondary | ICD-10-CM | POA: Diagnosis not present

## 2021-10-01 DIAGNOSIS — M722 Plantar fascial fibromatosis: Secondary | ICD-10-CM | POA: Diagnosis not present

## 2021-10-01 DIAGNOSIS — M7661 Achilles tendinitis, right leg: Secondary | ICD-10-CM

## 2021-10-01 NOTE — Progress Notes (Signed)
Subjective:  Patient ID: Brandy Ballard, female    DOB: 12-Oct-1965,  MRN: [id number]  Chief Complaint  Patient presents with   Foot Pain    Bilateral foot pain right foot pain in the back of the heel pt stated that it feels like a burning tearing sensation the pain has been off and on for about 4 years  Left foot possible cyst on the ankle and in the middle of the foot     56 y.o. female presents with the above complaint.  Patient presents with bilateral foot pain left plantar fibroma pain and right Achilles tendinitis.  She states been going for about 4 years has progressed to gotten worse.  She wanted to get the plantar fibroma evaluated.  Hurts with ambulation.  She constantly works on her foot during 3 days she has.  She is a Marine scientist at [employer].  She wanted to get it evaluated.  She denies any other acute complaints she has been managing her pain for a long time.   Review of Systems: Negative except as noted in the HPI. Denies N/V/F/Ch.  Past Medical History:  Diagnosis Date   Abdominal pain, epigastric    Allergy    Anemia    Arthritis    lower back   Benign neoplasm of cecum    Blood transfusion without reported diagnosis    Dental bridge present    permanent lower retainer   Gastric polyp    GERD (gastroesophageal reflux disease)    Hashimoto's disease    Polyp of nasal sinus    multiple   Polyp of sigmoid colon    PONV (postoperative nausea and vomiting)    nausea only   Sinus disease    Thyroid disease     Current Outpatient Medications:    acetaminophen (TYLENOL) 325 MG tablet, Tylenol 325 mg tablet, Disp: , Rfl:    acetaminophen (TYLENOL) 500 MG tablet, Take 1,000 mg by mouth daily. , Disp: , Rfl:    celecoxib (CELEBREX) 200 MG capsule, Take 1 capsule every day by oral route as needed., Disp: 60 capsule, Rfl: 0   celecoxib (CELEBREX) 200 MG capsule, Celebrex 200 mg capsule  Take 1 capsule every day by oral route as needed., Disp: , Rfl:    mometasone (NASONEX) 50  MCG/ACT nasal spray, Place 2 sprays into the nose daily., Disp: 17 g, Rfl: 12   mupirocin ointment (BACTROBAN) 2 %, mupirocin 2 % topical ointment, Disp: , Rfl:    oseltamivir (TAMIFLU) 75 MG capsule, oseltamivir 75 mg capsule, Disp: , Rfl:    valACYclovir (VALTREX) 1000 MG tablet, valacyclovir 1 gram tablet, Disp: , Rfl:   Social History   Tobacco Use  Smoking Status Former   Packs/day: 0.25   Years: 20.00   Pack years: 5.00   Types: Cigarettes   Quit date: 11/29/2018   Years since quitting: 2.8  Smokeless Tobacco Never  Tobacco Comments   down to 2-4 daily    Allergies  Allergen Reactions   Nsaids     Avoids due to Hx Peptic ulcers   Clearasil Daily Clear Acne [Benzoyl Peroxide] Rash   Objective:  There were no vitals filed for this visit. There is no height or weight on file to calculate BMI. Constitutional Well developed. Well nourished.  Vascular Dorsalis pedis pulses palpable bilaterally. Posterior tibial pulses palpable bilaterally. Capillary refill normal to all digits.  No cyanosis or clubbing noted. Pedal hair growth normal.  Neurologic Normal speech. Oriented to person, place,  and time. Epicritic sensation to light touch grossly present bilaterally.  Dermatologic Nails well groomed and normal in appearance. No open wounds. No skin lesions.  Orthopedic: Pain on palpation to the right posterior heel.  Clinically palpable posterior spurring noted positive Silfverskiold test bilaterally with gastrocnemius equinus.  Tight plantar fascia noted.  No pain at the calcaneal tuber at this time bilaterally.  Haglund's deformity noted bilaterally   Radiographs: 3 views of skeletally mature the bilateral foot: Posterior spurring noted at the Achilles tendon insertion.  Os trigonum syndrome noted bilaterally.  Plantar heel spur noted as well.  Pes cavus foot structure noted. Assessment:   1. Right Achilles tendinitis   2. Plantar fibromatosis    Plan:  Patient was  evaluated and treated and all questions answered.  Right Achilles tendinitis -I explained to the patient the etiology of Achilles tendinitis and various treatment options were extensively discussed.  Given the amount of pain that she is having she would benefit from cam boot immobilization to allow the soft tissue structures to heal appropriately.  She states understanding -Cam boot was dispensed -We will plan on doing a steroid injection during next clinical visit if there is no improvement  Left plantar fibroma -I explained patient etiology of plantar fibroma and various treatment options were discussed.  She does have history of Planter fasciitis in the setting of tight plantar fascia leading to micro tearing/plantar fibroma.  Ultimately I believe she would benefit from a steroid injection however for now she may benefit from taking the pressure off of plantar fascia to take the stress of the plantar fibroma to see if that helps.  She wants to hold off a steroid injection as long as possible. -Plantar fascial brace was discussed with the left  No follow-ups on file.

## 2021-10-22 ENCOUNTER — Other Ambulatory Visit: Payer: Self-pay

## 2021-10-22 ENCOUNTER — Ambulatory Visit (INDEPENDENT_AMBULATORY_CARE_PROVIDER_SITE_OTHER): Payer: No Typology Code available for payment source | Admitting: Family Medicine

## 2021-10-22 ENCOUNTER — Encounter: Payer: Self-pay | Admitting: Family Medicine

## 2021-10-22 VITALS — BP 120/76 | HR 88 | Temp 98.6°F | Ht 66.0 in | Wt 221.0 lb

## 2021-10-22 DIAGNOSIS — Z0000 Encounter for general adult medical examination without abnormal findings: Secondary | ICD-10-CM

## 2021-10-22 MED ORDER — METHOCARBAMOL 500 MG PO TABS
500.0000 mg | ORAL_TABLET | Freq: Four times a day (QID) | ORAL | 1 refills | Status: DC | PRN
  Filled 2021-10-22: qty 120, 30d supply, fill #0

## 2021-10-22 NOTE — Progress Notes (Signed)
BP 120/76    Pulse 88    Temp 98.6 F (37 C) (Oral)    Ht _0  (1.676 m)    Wt 221 lb (100.2 kg)    SpO2 97%    BMI 35.67 kg/m    Subjective:    Patient ID: Brandy Ballard, female    DOB: 25-Mar-1966, 56 y.o.   MRN: [id number]  HPI: [name] is a 56 y.o. female presenting on 10/22/2021 for comprehensive medical examination. Current medical complaints include:  Having a lot of issues with pain. Has not gotten in to see the neurosurgeon yet- but is going to be calling them shortly.   Menopausal Symptoms: no  Depression Screen done today and results listed below:  Depression screen [facility] 2/9 10/22/2021 09/11/2021 10/10/2020 08/17/2018 06/30/2018  Decreased Interest 0 0 1 0 0  Down, Depressed, Hopeless 0 0 2 0 0  PHQ - 2 Score 0 0 3 0 0  Altered sleeping 3 - 0 3 2  Tired, decreased energy 3 - _1 Change in appetite 0 - 2 3 0  Feeling bad or failure about yourself  0 - 0 0 0  Trouble concentrating 0 - 0 0 0  Moving slowly or fidgety/restless 0 - 0 0 0  Suicidal thoughts 0 - 0 0 0  PHQ-9 Score 6 - _2 Difficult doing work/chores Somewhat difficult - Somewhat difficult Not difficult at all Not difficult at all    Past Medical History:  Past Medical History:  Diagnosis Date   Abdominal pain, epigastric    Achilles tendinitis, right leg    Allergy    Anemia    Arthritis    lower back   Benign neoplasm of cecum    Blood transfusion without reported diagnosis    Dental bridge present    permanent lower retainer   Gastric polyp    GERD (gastroesophageal reflux disease)    Hashimoto's disease    Heel spur    bilateral   Plantar fasciitis    bilateral   Polyp of nasal sinus    multiple   Polyp of sigmoid colon    PONV (postoperative nausea and vomiting)    nausea only   Sinus disease    Thyroid disease     Surgical History:  Past Surgical History:  Procedure Laterality Date   COLONOSCOPY WITH PROPOFOL N/A 07/19/2018   Procedure: COLONOSCOPY WITH Biopsies;  Surgeon:  Lucilla Lame, MD;  Location: [place];  Service: Endoscopy;  Laterality: N/A;   ESOPHAGOGASTRODUODENOSCOPY (EGD) WITH PROPOFOL N/A 11/09/2018   Procedure: ESOPHAGOGASTRODUODENOSCOPY (EGD) WITH BIOPSIES;  Surgeon: Virgel Manifold, MD;  Location: [place];  Service: Endoscopy;  Laterality: N/A;   FRONTAL SINUS EXPLORATION Bilateral 10/05/2018   Procedure: FRONTAL SINUS EXPLORATION;  Surgeon: Beverly Gust, MD;  Location: [facility];  Service: ENT;  Laterality: Bilateral;   IMAGE GUIDED SINUS SURGERY N/A 10/05/2018   Procedure: IMAGE GUIDED SINUS SURGERY;  Surgeon: Beverly Gust, MD;  Location: [facility];  Service: ENT;  Laterality: N/A;   NASAL TURBINATE REDUCTION  10/05/2018   Procedure: TURBINATE REDUCTION/SUBMUCOSAL RESECTION;  Surgeon: Beverly Gust, MD;  Location: [facility];  Service: ENT;;   OVARIAN CYST REMOVAL     POLYPECTOMY N/A 07/19/2018   Procedure: POLYPECTOMY;  Surgeon: Lucilla Lame, MD;  Location: [place];  Service: Endoscopy;  Laterality: N/A;   SEPTOPLASTY WITH ETHMOIDECTOMY, AND MAXILLARY ANTROSTOMY Bilateral 10/05/2018   Procedure: SEPTOPLASTY WITH TOTAL ETHMOIDECTOMY,  AND MAXILLARY ANTROSTOMY WITH TISSUE REMOVAL;  Surgeon: Beverly Gust, MD;  Location: [facility];  Service: ENT;  Laterality: Bilateral;   SPHENOIDECTOMY  10/05/2018   Procedure: TOTAL SPHENOIDECTOMY;  Surgeon: Beverly Gust, MD;  Location: [facility];  Service: ENT;;   TOTAL ABDOMINAL HYSTERECTOMY      Medications:  Current Outpatient Medications on File Prior to Visit  Medication Sig   acetaminophen (TYLENOL) 500 MG tablet Take 1,000 mg by mouth daily.    celecoxib (CELEBREX) 200 MG capsule Celebrex 200 mg capsule  Take 1 capsule every day by oral route as needed.   melatonin 5 MG TABS Take 5 mg by mouth at bedtime.   mometasone (NASONEX) 50 MCG/ACT nasal spray Place 2 sprays into the nose daily.   mupirocin ointment (BACTROBAN) 2 % mupirocin 2 % topical  ointment   [DISCONTINUED] atorvastatin (LIPITOR) 20 MG tablet Take 1 tablet (20 mg total) by mouth daily.   No current facility-administered medications on file prior to visit.    Allergies:  Allergies  Allergen Reactions   Nsaids     Avoids due to Hx Peptic ulcers   Clearasil Daily Clear Acne [Benzoyl Peroxide] Rash    Social History:  Social History   Socioeconomic History   Marital status: Married    Spouse name: Not on file   Number of children: Not on file   Years of education: Not on file   Highest education level: Not on file  Occupational History   Not on file  Tobacco Use   Smoking status: Former    Packs/day: 0.25    Years: 20.00    Pack years: 5.00    Types: Cigarettes    Quit date: 11/29/2018    Years since quitting: 2.8   Smokeless tobacco: Never   Tobacco comments:    down to 2-4 daily  Vaping Use   Vaping Use: Never used  Substance and Sexual Activity   Alcohol use: Not Currently   Drug use: Never   Sexual activity: Yes    Birth control/protection: Surgical  Other Topics Concern   Not on file  Social History Narrative   Not on file   Social Determinants of Health   Financial Resource Strain: Not on file  Food Insecurity: Not on file  Transportation Needs: Not on file  Physical Activity: Not on file  Stress: Not on file  Social Connections: Not on file  Intimate Partner Violence: Not on file   Social History   Tobacco Use  Smoking Status Former   Packs/day: 0.25   Years: 20.00   Pack years: 5.00   Types: Cigarettes   Quit date: 11/29/2018   Years since quitting: 2.8  Smokeless Tobacco Never  Tobacco Comments   down to 2-4 daily   Social History   Substance and Sexual Activity  Alcohol Use Not Currently    Family History:  Family History  Problem Relation Age of Onset   Meniere's disease Mother    Hypertension Father    Atrial fibrillation Father    CVA Father    ADD / ADHD Daughter    Polycystic ovary syndrome Daughter     Breast cancer Paternal Aunt        over 60   Breast cancer Paternal Grandmother 57   Heart attack Paternal Grandfather    CVA Paternal Grandfather     Past medical history, surgical history, medications, allergies, family history and social history reviewed with patient today and changes made to appropriate areas  of the chart.   Review of Systems  Constitutional:  Positive for malaise/fatigue. Negative for chills, diaphoresis, fever and weight loss.  HENT: Negative.    Eyes:  Positive for blurred vision. Negative for double vision, photophobia, pain, discharge and redness.  Respiratory: Negative.    Cardiovascular:  Positive for palpitations and leg swelling. Negative for chest pain, orthopnea, claudication and PND.  Gastrointestinal:  Positive for heartburn (Well controlled with dietary changes) and nausea. Negative for abdominal pain, blood in stool, constipation, diarrhea, melena and vomiting.  Genitourinary: Negative.   Musculoskeletal:  Positive for back pain, joint pain and myalgias. Negative for falls and neck pain.       Having a lot of spasms in her calves  Skin: Negative.        Concerned about some of her moles  Neurological:  Positive for dizziness and headaches. Negative for tingling, tremors, sensory change, speech change, focal weakness, seizures, loss of consciousness and weakness.  Endo/Heme/Allergies:  Positive for environmental allergies. Negative for polydipsia. Does not bruise/bleed easily.  Psychiatric/Behavioral: Negative.    All other ROS negative except what is listed above and in the HPI.      Objective:    BP 120/76    Pulse 88    Temp 98.6 F (37 C) (Oral)    Ht _0  (1.676 m)    Wt 221 lb (100.2 kg)    SpO2 97%    BMI 35.67 kg/m   Wt Readings from Last 3 Encounters:  10/22/21 221 lb (100.2 kg)  09/11/21 228 lb 6.4 oz (103.6 kg)  10/10/20 242 lb 3.2 oz (109.9 kg)    Physical Exam Vitals and nursing note reviewed.  Constitutional:      General:  She is not in acute distress.    Appearance: Normal appearance. She is not ill-appearing, toxic-appearing or diaphoretic.  HENT:     Head: Normocephalic and atraumatic.     Right Ear: Tympanic membrane, ear canal and external ear normal. There is no impacted cerumen.     Left Ear: Tympanic membrane, ear canal and external ear normal. There is no impacted cerumen.     Nose: Nose normal. No congestion or rhinorrhea.     Mouth/Throat:     Mouth: Mucous membranes are moist.     Pharynx: Oropharynx is clear. No oropharyngeal exudate or posterior oropharyngeal erythema.  Eyes:     General: No scleral icterus.       Right eye: No discharge.        Left eye: No discharge.     Extraocular Movements: Extraocular movements intact.     Conjunctiva/sclera: Conjunctivae normal.     Pupils: Pupils are equal, round, and reactive to light.  Neck:     Vascular: No carotid bruit.  Cardiovascular:     Rate and Rhythm: Normal rate and regular rhythm.     Pulses: Normal pulses.     Heart sounds: No murmur heard.   No friction rub. No gallop.  Pulmonary:     Effort: Pulmonary effort is normal. No respiratory distress.     Breath sounds: Normal breath sounds. No stridor. No wheezing, rhonchi or rales.  Chest:     Chest wall: No tenderness.  Breasts:    Right: Normal.     Left: Normal.  Abdominal:     General: Abdomen is flat. Bowel sounds are normal. There is no distension.     Palpations: Abdomen is soft. There is no mass.  Tenderness: There is no abdominal tenderness. There is no right CVA tenderness, left CVA tenderness, guarding or rebound.     Hernia: No hernia is present.  Genitourinary:    Comments: Pelvic exam deferred with shared decision making Musculoskeletal:        General: No swelling, tenderness, deformity or signs of injury.     Cervical back: Normal range of motion and neck supple. No rigidity. No muscular tenderness.     Right lower leg: No edema.     Left lower leg: No edema.   Lymphadenopathy:     Cervical: No cervical adenopathy.  Skin:    General: Skin is warm and dry.     Capillary Refill: Capillary refill takes less than 2 seconds.     Coloration: Skin is not jaundiced or pale.     Findings: No bruising, erythema, lesion or rash.  Neurological:     General: No focal deficit present.     Mental Status: She is alert and oriented to person, place, and time. Mental status is at baseline.     Cranial Nerves: No cranial nerve deficit.     Sensory: No sensory deficit.     Motor: No weakness.     Coordination: Coordination normal.     Gait: Gait normal.     Deep Tendon Reflexes: Reflexes normal.  Psychiatric:        Mood and Affect: Mood normal.        Behavior: Behavior normal.        Thought Content: Thought content normal.        Judgment: Judgment normal.    Results for orders placed or performed in visit on 09/11/21  CBC with Differential/Platelet  Result Value Ref Range   WBC 8.5 3.4 - 10.8 x10E3/uL   RBC 4.61 3.77 - 5.28 x10E6/uL   Hemoglobin 14.7 11.1 - 15.9 g/dL   Hematocrit 43.2 34.0 - 46.6 %   MCV 94 79 - 97 fL   MCH 31.9 26.6 - 33.0 pg   MCHC 34.0 31.5 - 35.7 g/dL   RDW 12.8 11.7 - 15.4 %   Platelets 340 150 - 450 x10E3/uL   Neutrophils 57 Not Estab. %   Lymphs 24 Not Estab. %   Monocytes 7 Not Estab. %   Eos 10 Not Estab. %   Basos 1 Not Estab. %   Neutrophils Absolute 4.9 1.4 - 7.0 x10E3/uL   Lymphocytes Absolute 2.0 0.7 - 3.1 x10E3/uL   Monocytes Absolute 0.6 0.1 - 0.9 x10E3/uL   EOS (ABSOLUTE) 0.8 (H) 0.0 - 0.4 x10E3/uL   Basophils Absolute 0.1 0.0 - 0.2 x10E3/uL   Immature Granulocytes 1 Not Estab. %   Immature Grans (Abs) 0.0 0.0 - 0.1 x10E3/uL  Comprehensive metabolic panel  Result Value Ref Range   Glucose 108 (H) 70 - 99 mg/dL   BUN 15 6 - 24 mg/dL   Creatinine, Ser 0.67 0.57 - 1.00 mg/dL   eGFR 104 >59 mL/min/1.73   BUN/Creatinine Ratio 22 9 - 23   Sodium 139 134 - 144 mmol/L   Potassium 4.4 3.5 - 5.2 mmol/L    Chloride 101 96 - 106 mmol/L   CO2 22 20 - 29 mmol/L   Calcium 9.1 8.7 - 10.2 mg/dL   Total Protein 7.0 6.0 - 8.5 g/dL   Albumin 4.5 3.8 - 4.9 g/dL   Globulin, Total 2.5 1.5 - 4.5 g/dL   Albumin/Globulin Ratio 1.8 1.2 - 2.2   Bilirubin Total <0.2 0.0 -  1.2 mg/dL   Alkaline Phosphatase 72 44 - 121 IU/L   AST 18 0 - 40 IU/L   ALT 36 (H) 0 - 32 IU/L  Lipid Panel w/o Chol/HDL Ratio  Result Value Ref Range   Cholesterol, Total 279 (H) 100 - 199 mg/dL   Triglycerides 247 (H) 0 - 149 mg/dL   HDL 44 >39 mg/dL   VLDL Cholesterol Cal 48 (H) 5 - 40 mg/dL   LDL Chol Calc (NIH) 187 (H) 0 - 99 mg/dL  Urinalysis, Routine w reflex microscopic  Result Value Ref Range   Specific Gravity, UA 1.017 1.005 - 1.030   pH, UA 5.5 5.0 - 7.5   Color, UA Yellow Yellow   Appearance Ur Clear Clear   Leukocytes,UA Negative Negative   Protein,UA Negative Negative/Trace   Glucose, UA Negative Negative   Ketones, UA Negative Negative   RBC, UA Negative Negative   Bilirubin, UA Negative Negative   Urobilinogen, Ur 0.2 0.2 - 1.0 mg/dL   Nitrite, UA Negative Negative   Microscopic Examination Comment   Hepatitis C Antibody  Result Value Ref Range   Hep C Virus Ab <0.1 0.0 - 0.9 s/co ratio  Thyroid Panel With TSH  Result Value Ref Range   TSH 3.880 0.450 - 4.500 uIU/mL   T4, Total 7.1 4.5 - 12.0 ug/dL   T3 Uptake Ratio 27 24 - 39 %   Free Thyroxine Index 1.9 1.2 - 4.9      Assessment & Plan:   Problem List Items Addressed This Visit   None Visit Diagnoses     Routine general medical examination at a health care facility    -  Primary   Vaccines up to date. Screening labs checked last visit and normal. Pap N/A. Mammo ordered. Colonoscopy up to date. Continue diet and exercise. Call with concern        Follow up plan: Return in about 6 months (around 04/21/2022).   LABORATORY TESTING:  - Pap smear: not applicable  IMMUNIZATIONS:   - Tdap: Tetanus vaccination status reviewed: last tetanus  booster within 10 years. - Influenza: Up to date - Pneumovax: Not applicable - Prevnar: Not applicable - COVID: Up to date - HPV: Not applicable - Shingrix vaccine: Up to date  SCREENING: -Mammogram:  Encouraged her to go   - Colonoscopy: Up to date  - Bone Density: Not applicable   PATIENT COUNSELING:   Advised to take 1 mg of folate supplement per day if capable of pregnancy.   Sexuality: Discussed sexually transmitted diseases, partner selection, use of condoms, avoidance of unintended pregnancy  and contraceptive alternatives.   Advised to avoid cigarette smoking.  I discussed with the patient that most people either abstain from alcohol or drink within safe limits (<=14/week and <=4 drinks/occasion for males, <=7/weeks and <= 3 drinks/occasion for females) and that the risk for alcohol disorders and other health effects rises proportionally with the number of drinks per week and how often a drinker exceeds daily limits.  Discussed cessation/primary prevention of drug use and availability of treatment for abuse.   Diet: Encouraged to adjust caloric intake to maintain  or achieve ideal body weight, to reduce intake of dietary saturated fat and total fat, to limit sodium intake by avoiding high sodium foods and not adding table salt, and to maintain adequate dietary potassium and calcium preferably from fresh fruits, vegetables, and low-fat dairy products.    stressed the importance of regular exercise  Injury prevention: Discussed safety  belts, safety helmets, smoke detector, smoking near bedding or upholstery.   Dental health: Discussed importance of regular tooth brushing, flossing, and dental visits.    NEXT PREVENTATIVE PHYSICAL DUE IN 1 YEAR. Return in about 6 months (around 04/21/2022).

## 2021-10-22 NOTE — Patient Instructions (Signed)
Please call to schedule your mammogram: °[facility]  °Address: [address], [place], [place] [address]  °Phone: [phone] ° °

## 2021-10-23 ENCOUNTER — Ambulatory Visit: Payer: No Typology Code available for payment source | Admitting: Psychiatry

## 2021-10-23 ENCOUNTER — Encounter: Payer: Self-pay | Admitting: Psychiatry

## 2021-10-23 ENCOUNTER — Other Ambulatory Visit: Payer: Self-pay

## 2021-10-23 VITALS — BP 142/86 | HR 78 | Ht 66.0 in | Wt 225.0 lb

## 2021-10-23 DIAGNOSIS — R519 Headache, unspecified: Secondary | ICD-10-CM | POA: Diagnosis not present

## 2021-10-23 DIAGNOSIS — R42 Dizziness and giddiness: Secondary | ICD-10-CM

## 2021-10-23 DIAGNOSIS — H9313 Tinnitus, bilateral: Secondary | ICD-10-CM

## 2021-10-23 MED ORDER — LORAZEPAM 0.5 MG PO TABS
ORAL_TABLET | ORAL | 0 refills | Status: DC
  Filled 2021-10-23: qty 2, 1d supply, fill #0

## 2021-10-23 MED ORDER — RIZATRIPTAN BENZOATE 10 MG PO TABS
10.0000 mg | ORAL_TABLET | ORAL | 3 refills | Status: DC | PRN
  Filled 2021-10-23: qty 10, 17d supply, fill #0

## 2021-10-23 NOTE — Progress Notes (Signed)
Referring:  Valerie Roys, DO [place],  [place] [address]  PCP: Valerie Roys, DO  Neurology was asked to evaluate Brandy Ballard, a 56 year old female for a chief complaint of headaches.  Our recommendations of care will be communicated by shared medical record.    CC:  headaches  HPI:  Medical co-morbidities: cervical and lumbar stenosis, Hashimoto's thyroiditis  The patient presents for evaluation of migraines which began about 5 years ago. They used to be just one every 2 years, but have increased in frequency. Has had 10 episodes in the past year. She does not have headaches associated with her visual symptoms. Does get severe headaches with certain triggers like odors. Headaches have improved significantly since her sinus surgery. She has had 3 migraine headaches in the past year. Takes Tylenol as needed but this generally does not help her headache.  Had intermittent ringing in her ears for a few years, but it became constant in the past year. She does get occasional whooshing sounds as well. Has also noted progressive hearing loss in the past year.  She has also had vertigo infrequently over the past 10 years. Feels like the earth is moving rather than the room is spinning. Now she is having vertigo 2-3 times per week. It lasts 1 minute at a time. There are no clear triggers other than standing up too quickly.   Had some trouble focusing her eyes so she saw ophthalmology who noted a normal eye exam.   Headache History: Onset: 5 years ago Triggers: odors Aura: kaleidoscope, geometric patterns, flashes - last 45 minutes to an hour Location: bifrontal, vertex Quality/Description: pressure, throbbing Associated Symptoms:  Photophobia: yes  Phonophobia: yes  Nausea: no Worse with activity?: yes Duration of headaches: several hours  Headache days per month: 13 Headache free days per month: 17  Current Treatment: Abortive Tylenol  Preventative none  Prior  Therapies                                 Celebrex Tylenol   Headache Risk Factors: Headache risk factors and/or co-morbidities (+) Neck Pain (+) Back Pain (+) Obesity  Body mass index is 36.32 kg/m.  LABS: CBC Latest Ref Rng & Units 09/11/2021 01/02/2019 07/10/2018  WBC 3.4 - 10.8 x10E3/uL 8.5 7.5 15.6(H)  Hemoglobin 11.1 - 15.9 g/dL 14.7 14.7 13.2  Hematocrit 34.0 - 46.6 % 43.2 43.7 39.5  Platelets 150 - 450 x10E3/uL 340 319 412(H)   CMP Latest Ref Rng & Units 09/11/2021 01/02/2019 07/10/2018  Glucose 70 - 99 mg/dL 108(H) 142(H) -  BUN 6 - 24 mg/dL 15 16 -  Creatinine 0.57 - 1.00 mg/dL 0.67 0.60 0.50  Sodium 134 - 144 mmol/L 139 139 -  Potassium 3.5 - 5.2 mmol/L 4.4 3.6 -  Chloride 96 - 106 mmol/L 101 105 -  CO2 20 - 29 mmol/L 22 26 -  Calcium 8.7 - 10.2 mg/dL 9.1 9.0 -  Total Protein 6.0 - 8.5 g/dL 7.0 7.7 -  Total Bilirubin 0.0 - 1.2 mg/dL <0.2 0.2(L) -  Alkaline Phos 44 - 121 IU/L 72 69 -  AST 0 - 40 IU/L 18 26 -  ALT 0 - 32 IU/L 36(H) 50(H) -     IMAGING:  CTH 2019: unremarkable brain, bilateral sinusitis CTA head/neck 2019: no significant stenosis  Imaging independently reviewed on October 23, 2021   Current Outpatient Medications  on File Prior to Visit  Medication Sig Dispense Refill   acetaminophen (TYLENOL) 500 MG tablet Take 1,000 mg by mouth daily.      melatonin 5 MG TABS Take 5 mg by mouth at bedtime.     mometasone (NASONEX) 50 MCG/ACT nasal spray Place 2 sprays into the nose daily. 17 g 12   mupirocin ointment (BACTROBAN) 2 % mupirocin 2 % topical ointment     [DISCONTINUED] atorvastatin (LIPITOR) 20 MG tablet Take 1 tablet (20 mg total) by mouth daily. 90 tablet 1   No current facility-administered medications on file prior to visit.     Allergies: Allergies  Allergen Reactions   Nsaids     Avoids due to Hx Peptic ulcers   Clearasil Daily Clear Acne [Benzoyl Peroxide] Rash    Family History: Migraine or other headaches in the family:   mother, father Aneurysms in a first degree relative:  none Brain tumors in the family:  none Other neurological illness in the family:   Chiari malformation  Past Medical History: Past Medical History:  Diagnosis Date   Abdominal pain, epigastric    Achilles tendinitis, right leg    Allergy    Anemia    Arthritis    lower back   Benign neoplasm of cecum    Blood transfusion without reported diagnosis    Dental bridge present    permanent lower retainer   Gastric polyp    GERD (gastroesophageal reflux disease)    Hashimoto's disease    Heel spur    bilateral   Plantar fasciitis    bilateral   Polyp of nasal sinus    multiple   Polyp of sigmoid colon    PONV (postoperative nausea and vomiting)    nausea only   Sinus disease    Thyroid disease     Past Surgical History Past Surgical History:  Procedure Laterality Date   COLONOSCOPY WITH PROPOFOL N/A 07/19/2018   Procedure: COLONOSCOPY WITH Biopsies;  Surgeon: Lucilla Lame, MD;  Location: [place];  Service: Endoscopy;  Laterality: N/A;   ESOPHAGOGASTRODUODENOSCOPY (EGD) WITH PROPOFOL N/A 11/09/2018   Procedure: ESOPHAGOGASTRODUODENOSCOPY (EGD) WITH BIOPSIES;  Surgeon: Virgel Manifold, MD;  Location: [place];  Service: Endoscopy;  Laterality: N/A;   FRONTAL SINUS EXPLORATION Bilateral 10/05/2018   Procedure: FRONTAL SINUS EXPLORATION;  Surgeon: Beverly Gust, MD;  Location: [facility];  Service: ENT;  Laterality: Bilateral;   IMAGE GUIDED SINUS SURGERY N/A 10/05/2018   Procedure: IMAGE GUIDED SINUS SURGERY;  Surgeon: Beverly Gust, MD;  Location: [facility];  Service: ENT;  Laterality: N/A;   NASAL TURBINATE REDUCTION  10/05/2018   Procedure: TURBINATE REDUCTION/SUBMUCOSAL RESECTION;  Surgeon: Beverly Gust, MD;  Location: [facility];  Service: ENT;;   OVARIAN CYST REMOVAL     POLYPECTOMY N/A 07/19/2018   Procedure: POLYPECTOMY;  Surgeon: Lucilla Lame, MD;  Location: [place];   Service: Endoscopy;  Laterality: N/A;   SEPTOPLASTY WITH ETHMOIDECTOMY, AND MAXILLARY ANTROSTOMY Bilateral 10/05/2018   Procedure: SEPTOPLASTY WITH TOTAL ETHMOIDECTOMY, AND MAXILLARY ANTROSTOMY WITH TISSUE REMOVAL;  Surgeon: Beverly Gust, MD;  Location: [facility];  Service: ENT;  Laterality: Bilateral;   SPHENOIDECTOMY  10/05/2018   Procedure: TOTAL SPHENOIDECTOMY;  Surgeon: Beverly Gust, MD;  Location: [facility];  Service: ENT;;   TOTAL ABDOMINAL HYSTERECTOMY      Social History: Social History   Tobacco Use   Smoking status: Former    Packs/day: 0.25    Years: 20.00    Pack  years: 5.00    Types: Cigarettes    Quit date: 11/29/2018    Years since quitting: 2.9   Smokeless tobacco: Never   Tobacco comments:    down to 2-4 daily  Vaping Use   Vaping Use: Never used  Substance Use Topics   Alcohol use: Not Currently   Drug use: Never    ROS: Negative for fevers, chills. Positive for headaches, vertigo. All other systems reviewed and negative unless stated otherwise in HPI.   Physical Exam:   Vital Signs: BP (!) 142/86    Pulse 78    Ht 5\' 6"  (1.676 m)    Wt 225 lb (102.1 kg)    BMI 36.32 kg/m  GENERAL: well appearing,in no acute distress,alert SKIN:  Color, texture, turgor normal. No rashes or lesions HEAD:  Normocephalic/atraumatic. CV:  RRR RESP: Normal respiratory effort MSK: +tenderness to palpation over bilateral occiput, neck, and shoulders. Mildly limited cervical range of motion turning neck left and right  NEUROLOGICAL: Mental Status: Alert, oriented to person, place and time,Follows commands Cranial Nerves: PERRL,visual fields intact to confrontation,extraocular movements intact,facial sensation intact,no facial droop or ptosis,hearing grossly intact,no dysarthria Motor: muscle strength 5/5 both upper and lower extremities,no drift, normal tone Reflexes: 2+ throughout Sensation: intact to light touch all 4 extremities Coordination: Finger-to- nose-finger  intact bilaterally,Heel-to-shin intact bilaterally Gait: antalgic (notes significant back pain today)   IMPRESSION: 56 year old female with a history of cervical and lumbar stenosis, Hashimoto's thyroiditis who presents for evaluation of worsening headaches, vertigo, and hearing loss. Will order MRI brain as symptoms have been progressively worsening and tinnitus is now constant. Discussed how her sinus headaches are likely migraines given associated photophobia and phonophobia. Will start Maxalt for rescue. Counseled that she can take daily magnesium to help reduce migraine headache and aura frequency. Will treat headache and see if vertigo improves. If she does not notice improvement in vertigo would consider vestibular therapy.  PLAN: -MRI brain -Preventive: Start magnesium oxide 400 mg daily -Rescue: Start Maxalt 10 mg PRN -next steps: consider vestibular therapy for vertigo  I spent a total of 34 minutes chart reviewing and counseling the patient. Headache education was done. Discussed treatment options including acute medications and natural supplements. Discussed medication overuse headache and to limit use of acute treatments to no more than 2 days/week or 10 days/month. Discussed medication side effects, adverse reactions and drug interactions. Written educational materials and patient instructions outlining all of the above were given.  Follow-up: 3 months   Genia Harold, MD 10/23/2021   10:16 AM

## 2021-10-23 NOTE — Patient Instructions (Addendum)
MRI brain Start Maxalt 10 mg as needed for migraine. Take at the onset of migraine. If headache recurs or does not fully resolve, you may take a second dose after 2 hours. Please avoid taking more than 2 days per week You can try daily magnesium to help reduce your optical migraines. You can take 400 mg magnesium oxide daily

## 2021-10-28 ENCOUNTER — Telehealth: Payer: Self-pay | Admitting: Psychiatry

## 2021-10-28 NOTE — Telephone Encounter (Signed)
LVM for pt to call back to schedule  [facility] auth: [employer] spoke to [place] ref # [id number]

## 2021-10-31 ENCOUNTER — Ambulatory Visit (INDEPENDENT_AMBULATORY_CARE_PROVIDER_SITE_OTHER): Payer: No Typology Code available for payment source | Admitting: Podiatry

## 2021-10-31 ENCOUNTER — Encounter: Payer: Self-pay | Admitting: Podiatry

## 2021-10-31 ENCOUNTER — Other Ambulatory Visit: Payer: Self-pay

## 2021-10-31 DIAGNOSIS — M7661 Achilles tendinitis, right leg: Secondary | ICD-10-CM

## 2021-10-31 DIAGNOSIS — M21861 Other specified acquired deformities of right lower leg: Secondary | ICD-10-CM

## 2021-10-31 NOTE — Progress Notes (Signed)
Subjective:  Patient ID: Brandy Ballard, female    DOB: November 15, 1965,  MRN: [id number]  Chief Complaint  Patient presents with   Foot Pain    Pt stated that she is still having pain    56 y.o. female presents with the above complaint.  Patient presents with follow-up to right Achilles tendinitis.  She states she was not able to tolerate the boot work for 6 days.  Her back started hurting.  She states the left side is doing about the same the brace did not help much.  She would like to discuss treatment options for the right side primarily.  She denies any other acute complaints  Review of Systems: Negative except as noted in the HPI. Denies N/V/F/Ch.  Past Medical History:  Diagnosis Date   Abdominal pain, epigastric    Achilles tendinitis, right leg    Allergy    Anemia    Arthritis    lower back   Benign neoplasm of cecum    Blood transfusion without reported diagnosis    Dental bridge present    permanent lower retainer   Gastric polyp    GERD (gastroesophageal reflux disease)    Hashimoto's disease    Heel spur    bilateral   Plantar fasciitis    bilateral   Polyp of nasal sinus    multiple   Polyp of sigmoid colon    PONV (postoperative nausea and vomiting)    nausea only   Sinus disease    Thyroid disease     Current Outpatient Medications:    acetaminophen (TYLENOL) 500 MG tablet, Take 1,000 mg by mouth daily. , Disp: , Rfl:    LORazepam (ATIVAN) 0.5 MG tablet, Take 1-2 tablets 45 minutes before MRI, Disp: 30 tablet, Rfl: 0   melatonin 5 MG TABS, Take 5 mg by mouth at bedtime., Disp: , Rfl:    methocarbamol (ROBAXIN) 500 MG tablet, Take 1 tablet (500 mg total) by mouth every 6 (six) hours as needed for muscle spasms. (Patient not taking: Reported on 10/23/2021), Disp: 120 tablet, Rfl: 1   mometasone (NASONEX) 50 MCG/ACT nasal spray, Place 2 sprays into the nose daily., Disp: 17 g, Rfl: 12   mupirocin ointment (BACTROBAN) 2 %, mupirocin 2 % topical ointment, Disp: ,  Rfl:    rizatriptan (MAXALT) 10 MG tablet, Take 1 tablet (10 mg total) by mouth as needed for migraine. May repeat in 2 hours if needed, Disp: 10 tablet, Rfl: 3  Social History   Tobacco Use  Smoking Status Former   Packs/day: 0.25   Years: 20.00   Pack years: 5.00   Types: Cigarettes   Quit date: 11/29/2018   Years since quitting: 2.9  Smokeless Tobacco Never  Tobacco Comments   down to 2-4 daily    Allergies  Allergen Reactions   Nsaids     Avoids due to Hx Peptic ulcers   Clearasil Daily Clear Acne [Benzoyl Peroxide] Rash   Objective:  There were no vitals filed for this visit. There is no height or weight on file to calculate BMI. Constitutional Well developed. Well nourished.  Vascular Dorsalis pedis pulses palpable bilaterally. Posterior tibial pulses palpable bilaterally. Capillary refill normal to all digits.  No cyanosis or clubbing noted. Pedal hair growth normal.  Neurologic Normal speech. Oriented to person, place, and time. Epicritic sensation to light touch grossly present bilaterally.  Dermatologic Nails well groomed and normal in appearance. No open wounds. No skin lesions.  Orthopedic: Pain on palpation  to the right posterior heel.  Clinically palpable posterior spurring noted positive Silfverskiold test bilaterally with gastrocnemius equinus.  Tight plantar fascia noted.  No pain at the calcaneal tuber at this time bilaterally.  Haglund's deformity noted bilaterally   Radiographs: 3 views of skeletally mature the bilateral foot: Posterior spurring noted at the Achilles tendon insertion.  Os trigonum syndrome noted bilaterally.  Plantar heel spur noted as well.  Pes cavus foot structure noted. Assessment:   1. Right Achilles tendinitis   2. Gastrocnemius equinus of right lower extremity     Plan:  Patient was evaluated and treated and all questions answered.  Right Achilles tendinitis with underlying equinus gastrocnemius -I explained to the patient  the etiology of Achilles tendinitis and various treatment options were extensively discussed.  Given the amount of pain that she is having she would benefit from cam boot immobilization to allow the soft tissue structures to heal appropriately.  She states understanding -Continue using cam boot with even up on the other side to prevent back pain. -Given that she still has good amount of pain I believe she will benefit from steroid injection help decrease acute inflammatory component associate with pain.  I discussed with the patient there is a high risk of rupture associated with it given the nearby Achilles tendon.  Patient states understand like to proceed with injection -A steroid injection was performed at right Kager's fat pad using 1% plain Lidocaine and 10 mg of Kenalog. This was well tolerated.   Left plantar fibroma -I explained patient etiology of plantar fibroma and various treatment options were discussed.  She does have history of Planter fasciitis in the setting of tight plantar fascia leading to micro tearing/plantar fibroma.  Ultimately I believe she would benefit from a steroid injection however for now she may benefit from taking the pressure off of plantar fascia to take the stress of the plantar fibroma to see if that helps.  She wants to hold off a steroid injection as long as possible. -Plantar fascial brace was discussed with the left  No follow-ups on file.

## 2021-10-31 NOTE — Telephone Encounter (Signed)
Patient returned my call she is scheduled at [facility] for 11/06/21.

## 2021-10-31 NOTE — Telephone Encounter (Signed)
X2 lvm for pt to call back.

## 2021-11-06 ENCOUNTER — Ambulatory Visit: Payer: No Typology Code available for payment source

## 2021-11-06 DIAGNOSIS — R519 Headache, unspecified: Secondary | ICD-10-CM | POA: Diagnosis not present

## 2021-11-06 MED ORDER — GADOBENATE DIMEGLUMINE 529 MG/ML IV SOLN
20.0000 mL | Freq: Once | INTRAVENOUS | Status: AC | PRN
  Administered 2021-11-06: 20 mL via INTRAVENOUS

## 2021-11-18 ENCOUNTER — Emergency Department
Admission: EM | Admit: 2021-11-18 | Discharge: 2021-11-18 | Disposition: A | Discharge status: Home / Self Care | Payer: No Typology Code available for payment source | Attending: Emergency Medicine | Admitting: Emergency Medicine

## 2021-11-18 ENCOUNTER — Emergency Department: Payer: No Typology Code available for payment source

## 2021-11-18 ENCOUNTER — Other Ambulatory Visit: Payer: Self-pay

## 2021-11-18 DIAGNOSIS — R1032 Left lower quadrant pain: Secondary | ICD-10-CM | POA: Diagnosis present

## 2021-11-18 DIAGNOSIS — R102 Pelvic and perineal pain: Secondary | ICD-10-CM

## 2021-11-18 DIAGNOSIS — K5792 Diverticulitis of intestine, part unspecified, without perforation or abscess without bleeding: Secondary | ICD-10-CM | POA: Diagnosis not present

## 2021-11-18 DIAGNOSIS — K219 Gastro-esophageal reflux disease without esophagitis: Secondary | ICD-10-CM | POA: Insufficient documentation

## 2021-11-18 LAB — URINALYSIS, ROUTINE W REFLEX MICROSCOPIC
Bilirubin Urine: NEGATIVE
Glucose, UA: NEGATIVE mg/dL
Ketones, ur: NEGATIVE mg/dL
Nitrite: NEGATIVE
Protein, ur: NEGATIVE mg/dL
Specific Gravity, Urine: 1.028 (ref 1.005–1.030)
pH: 5 (ref 5.0–8.0)

## 2021-11-18 LAB — CBC
HCT: 42.5 % (ref 36.0–46.0)
Hemoglobin: 13.8 g/dL (ref 12.0–15.0)
MCH: 31.4 pg (ref 26.0–34.0)
MCHC: 32.5 g/dL (ref 30.0–36.0)
MCV: 96.8 fL (ref 80.0–100.0)
Platelets: 325 10*3/uL (ref 150–400)
RBC: 4.39 MIL/uL (ref 3.87–5.11)
RDW: 12.3 % (ref 11.5–15.5)
WBC: 10.3 10*3/uL (ref 4.0–10.5)
nRBC: 0 % (ref 0.0–0.2)

## 2021-11-18 LAB — COMPREHENSIVE METABOLIC PANEL
ALT: 22 U/L (ref 0–44)
AST: 15 U/L (ref 15–41)
Albumin: 4 g/dL (ref 3.5–5.0)
Alkaline Phosphatase: 69 U/L (ref 38–126)
Anion gap: 8 (ref 5–15)
BUN: 21 mg/dL — ABNORMAL HIGH (ref 6–20)
CO2: 26 mmol/L (ref 22–32)
Calcium: 9.4 mg/dL (ref 8.9–10.3)
Chloride: 101 mmol/L (ref 98–111)
Creatinine, Ser: 0.6 mg/dL (ref 0.44–1.00)
GFR, Estimated: >60 mL/min (ref >60)
Glucose, Bld: 112 mg/dL — ABNORMAL HIGH (ref 70–99)
Potassium: 4 mmol/L (ref 3.5–5.1)
Sodium: 135 mmol/L (ref 135–145)
Total Bilirubin: 0.6 mg/dL (ref 0.3–1.2)
Total Protein: 8.1 g/dL (ref 6.5–8.1)

## 2021-11-18 LAB — LIPASE, BLOOD: Lipase: 52 U/L — ABNORMAL HIGH (ref 11–51)

## 2021-11-18 MED ORDER — MORPHINE SULFATE (PF) 4 MG/ML IV SOLN
4.0000 mg | Freq: Once | INTRAVENOUS | Status: AC
  Administered 2021-11-18: 4 mg via INTRAVENOUS
  Filled 2021-11-18: qty 1

## 2021-11-18 MED ORDER — OXYCODONE-ACETAMINOPHEN 5-325 MG PO TABS
2.0000 | ORAL_TABLET | Freq: Four times a day (QID) | ORAL | 0 refills | Status: DC | PRN
  Filled 2021-11-18: qty 20, 3d supply, fill #0

## 2021-11-18 MED ORDER — AMOXICILLIN-POT CLAVULANATE 875-125 MG PO TABS
1.0000 | ORAL_TABLET | Freq: Two times a day (BID) | ORAL | 0 refills | Status: AC
  Filled 2021-11-18: qty 14, 7d supply, fill #0

## 2021-11-18 MED ORDER — SODIUM CHLORIDE 0.9 % IV BOLUS (SEPSIS)
1000.0000 mL | Freq: Once | INTRAVENOUS | Status: AC
  Administered 2021-11-18: 1000 mL via INTRAVENOUS

## 2021-11-18 MED ORDER — ONDANSETRON HCL 4 MG/2ML IJ SOLN
4.0000 mg | Freq: Once | INTRAMUSCULAR | Status: AC
  Administered 2021-11-18: 4 mg via INTRAVENOUS
  Filled 2021-11-18: qty 2

## 2021-11-18 MED ORDER — IOHEXOL 300 MG/ML  SOLN
100.0000 mL | Freq: Once | INTRAMUSCULAR | Status: AC | PRN
  Administered 2021-11-18: 100 mL via INTRAVENOUS

## 2021-11-18 MED ORDER — HYDROMORPHONE HCL 1 MG/ML IJ SOLN
1.0000 mg | Freq: Once | INTRAMUSCULAR | Status: AC
  Administered 2021-11-18: 1 mg via INTRAVENOUS
  Filled 2021-11-18: qty 1

## 2021-11-18 MED ORDER — ONDANSETRON 4 MG PO TBDP
4.0000 mg | ORAL_TABLET | Freq: Four times a day (QID) | ORAL | 0 refills | Status: DC | PRN
  Filled 2021-11-18: qty 20, 5d supply, fill #0

## 2021-11-18 NOTE — Discharge Instructions (Addendum)
You are being provided a prescription for opiates (also known as narcotics) for pain control.  Opiates can be addictive and should only be used when absolutely necessary for pain control when other alternatives do not work.  We recommend you only use them for the recommended amount of time and only as prescribed.  Please do not take with other sedative medications or alcohol.  Please do not drive, operate machinery, make important decisions while taking opiates.  Please note that these medications can be addictive and have high abuse potential.  Patients can become addicted to narcotics after only taking them for a few days.  Please keep these medications locked away from children, teenagers or any family members with history of substance abuse.  Narcotic pain medicine may also make you constipated.  You may use over-the-counter medications such as MiraLAX, Colace to prevent constipation.  If you become constipated, you may use over-the-counter enemas as needed.  Itching and nausea are also common side effects of narcotic pain medication.  If you develop uncontrolled vomiting or a rash, please stop these medications and seek medical care.   Current medical recommendations recommend avoiding antibiotics for uncomplicated diverticulitis however if your symptoms or not improving in 3 to 4 days or you develop a fever of 100.4 or higher, I have sent a prescription of Augmentin to your pharmacy that you may start.

## 2021-11-18 NOTE — ED Provider Notes (Signed)
[facility] Provider Note    Event Date/Time   First MD Initiated Contact with Patient 11/18/21 [phone]     (approximate)   History   Pelvic Pain   HPI  Brandy Ballard is a 56 y.o. female with history of thyroid disease who presents to the emergency department with left lower abdominal pain over the past couple of days that has progressively worsened.  Now feels like her abdomen is bloated.  No nausea, vomiting, diarrhea.  No dysuria, hematuria, vaginal bleeding or discharge.  No previous history of kidney stones, diverticulosis or diverticulitis, small bowel obstruction.  Has had previous ovarian cysts and has had a cystectomy before and has had a hysterectomy and right-sided oophorectomy.  Pain radiates into her flank.  Is having normal BMs and passing gas.   History provided by patient.    Past Medical History:  Diagnosis Date   Abdominal pain, epigastric    Achilles tendinitis, right leg    Allergy    Anemia    Arthritis    lower back   Benign neoplasm of cecum    Blood transfusion without reported diagnosis    Dental bridge present    permanent lower retainer   Gastric polyp    GERD (gastroesophageal reflux disease)    Hashimoto's disease    Heel spur    bilateral   Plantar fasciitis    bilateral   Polyp of nasal sinus    multiple   Polyp of sigmoid colon    PONV (postoperative nausea and vomiting)    nausea only   Sinus disease    Thyroid disease     Past Surgical History:  Procedure Laterality Date   COLONOSCOPY WITH PROPOFOL N/A 07/19/2018   Procedure: COLONOSCOPY WITH Biopsies;  Surgeon: Lucilla Lame, MD;  Location: [place];  Service: Endoscopy;  Laterality: N/A;   ESOPHAGOGASTRODUODENOSCOPY (EGD) WITH PROPOFOL N/A 11/09/2018   Procedure: ESOPHAGOGASTRODUODENOSCOPY (EGD) WITH BIOPSIES;  Surgeon: Virgel Manifold, MD;  Location: [place];  Service: Endoscopy;  Laterality: N/A;   FRONTAL SINUS EXPLORATION  Bilateral 10/05/2018   Procedure: FRONTAL SINUS EXPLORATION;  Surgeon: Beverly Gust, MD;  Location: [facility];  Service: ENT;  Laterality: Bilateral;   IMAGE GUIDED SINUS SURGERY N/A 10/05/2018   Procedure: IMAGE GUIDED SINUS SURGERY;  Surgeon: Beverly Gust, MD;  Location: [facility];  Service: ENT;  Laterality: N/A;   NASAL TURBINATE REDUCTION  10/05/2018   Procedure: TURBINATE REDUCTION/SUBMUCOSAL RESECTION;  Surgeon: Beverly Gust, MD;  Location: [facility];  Service: ENT;;   OVARIAN CYST REMOVAL     POLYPECTOMY N/A 07/19/2018   Procedure: POLYPECTOMY;  Surgeon: Lucilla Lame, MD;  Location: [place];  Service: Endoscopy;  Laterality: N/A;   SEPTOPLASTY WITH ETHMOIDECTOMY, AND MAXILLARY ANTROSTOMY Bilateral 10/05/2018   Procedure: SEPTOPLASTY WITH TOTAL ETHMOIDECTOMY, AND MAXILLARY ANTROSTOMY WITH TISSUE REMOVAL;  Surgeon: Beverly Gust, MD;  Location: [facility];  Service: ENT;  Laterality: Bilateral;   SPHENOIDECTOMY  10/05/2018   Procedure: TOTAL SPHENOIDECTOMY;  Surgeon: Beverly Gust, MD;  Location: [facility];  Service: ENT;;   TOTAL ABDOMINAL HYSTERECTOMY      MEDICATIONS:  Prior to Admission medications   Medication Sig Start Date End Date Taking? Authorizing Provider  acetaminophen (TYLENOL) 500 MG tablet Take 1,000 mg by mouth daily.     [provider]  LORazepam (ATIVAN) 0.5 MG tablet Take 1-2 tablets 45 minutes before MRI 10/23/21   Genia Harold, MD  melatonin 5 MG TABS Take 5  mg by mouth at bedtime.    [provider]  methocarbamol (ROBAXIN) 500 MG tablet Take 1 tablet (500 mg total) by mouth every 6 (six) hours as needed for muscle spasms. Patient not taking: Reported on 10/23/2021 10/22/21   Park Liter P, DO  mometasone (NASONEX) 50 MCG/ACT nasal spray Place 2 sprays into the nose daily. 09/11/21   Johnson, Megan P, DO  mupirocin ointment (BACTROBAN) 2 % mupirocin 2 % topical ointment    [provider]  rizatriptan  (MAXALT) 10 MG tablet Take 1 tablet (10 mg total) by mouth as needed for migraine. May repeat in 2 hours if needed 10/23/21   Genia Harold, MD  atorvastatin (LIPITOR) 20 MG tablet Take 1 tablet (20 mg total) by mouth daily. 08/19/18 05/29/19  Trinna Post, PA-C    Physical Exam   Triage Vital Signs: ED Triage Vitals [11/18/21 0345]  Enc Vitals Group     BP 130/71     Pulse Rate 82     Resp 16     Temp 98 F (36.7 C)     Temp Source Oral     SpO2 98 %     Weight 225 lb (102.1 kg)     Height 5\' 6"  (1.676 m)     Head Circumference      Peak Flow      Pain Score 8     Pain Loc      Pain Edu?      Excl. in [place]?     Most recent vital signs: Vitals:   11/18/21 0500 11/18/21 0600  BP: 122/76 126/84  Pulse: 76   Resp:    Temp:    SpO2: 91% 95%    CONSTITUTIONAL: Alert and oriented and responds appropriately to questions. Well-appearing; well-nourished HEAD: Normocephalic, atraumatic EYES: Conjunctivae clear, pupils appear equal, sclera nonicteric ENT: normal nose; moist mucous membranes NECK: Supple, normal ROM CARD: RRR; S1 and S2 appreciated; no murmurs, no clicks, no rubs, no gallops RESP: Normal chest excursion without splinting or tachypnea; breath sounds clear and equal bilaterally; no wheezes, no rhonchi, no rales, no hypoxia or respiratory distress, speaking full sentences ABD/GI: Normal bowel sounds; non-distended; soft, tender to palpation in the left pelvic region and left lower quadrant without guarding or rebound BACK: The back appears normal EXT: Normal ROM in all joints; no deformity noted, no edema; no cyanosis SKIN: Normal color for age and race; warm; no rash on exposed skin NEURO: Moves all extremities equally, normal speech PSYCH: The patient's mood and manner are appropriate.   ED Results / Procedures / Treatments   LABS: (all labs ordered are listed, but only abnormal results are displayed) Labs Reviewed  COMPREHENSIVE METABOLIC PANEL -  Abnormal; Notable for the following components:      Result Value   Glucose, Bld 112 (*)    BUN 21 (*)    All other components within normal limits  URINALYSIS, ROUTINE W REFLEX MICROSCOPIC - Abnormal; Notable for the following components:   Color, Urine YELLOW (*)    APPearance HAZY (*)    Hgb urine dipstick SMALL (*)    Leukocytes,Ua TRACE (*)    Bacteria, UA RARE (*)    All other components within normal limits  LIPASE, BLOOD - Abnormal; Notable for the following components:   Lipase 52 (*)    All other components within normal limits  URINE CULTURE  CBC     EKG:   RADIOLOGY: My personal review and  interpretation of imaging: CT scan shows noncomplicated diverticulitis.  I have personally reviewed all radiology reports.   CT ABDOMEN PELVIS W CONTRAST  Result Date: 11/18/2021 CLINICAL DATA:  56 year old female with left lower quadrant abdominal and pelvic pain. EXAM: CT ABDOMEN AND PELVIS WITH CONTRAST TECHNIQUE: Multidetector CT imaging of the abdomen and pelvis was performed using the standard protocol following bolus administration of intravenous contrast. RADIATION DOSE REDUCTION: This exam was performed according to the departmental dose-optimization program which includes automated exposure control, adjustment of the mA and/or kV according to patient size and/or use of iterative reconstruction technique. CONTRAST:  135mL OMNIPAQUE IOHEXOL 300 MG/ML  SOLN COMPARISON:  Pelvis ultrasound 0545 hours today. FINDINGS: Lower chest: Low lung volumes with lung base atelectasis. Cardiac size at the upper limits of normal. No pericardial or pleural effusion. Hepatobiliary: Circumscribed simple fluid density cyst in the right lobe unchanged on the delayed images is 12 mm and appears benign. Liver dome not completely visualized. Negative visible liver parenchyma otherwise. Negative gallbladder. Pancreas: Negative. Spleen: Negative. Adrenals/Urinary Tract: Normal adrenal glands. Nonobstructed  kidneys appear symmetric, normal. Left ureter remains normal although marginally affected by inflammation at the left pelvic inlet, see below. Incidental pelvic phleboliths. There is a solitary dot of gas in the bladder fundus on sagittal image 82, but significance is doubtful as otherwise the bladder appears unremarkable. Stomach/Bowel: Negative rectum. Retained stool in redundant sigmoid colon in the pelvis. At the junction of the descending and sigmoid colon at the left pelvic inlet there is confluent inflammation in the region of hyperdense diverticula of the large bowel (series 2, image 73 and coronal image 36). 5 cm segment of the bowel is affected, and there is associated left retroperitoneal inflammatory stranding in addition to the mesenteric inflammation. No extraluminal gas. No organized or drainable fluid. Upstream nondilated descending colon with retained stool. Similar retained stool in redundant transverse colon. Negative right colon with normal appendix on series 2, image 67. No dilated small bowel. Decompressed stomach and duodenum. No free air. Vascular/Lymphatic: Major arterial structures are patent. Mild Aortoiliac calcified atherosclerosis. Portal venous system is patent. No lymphadenopathy identified. Reproductive: Surgically absent uterus and right adnexa. Left adnexa inseparable from abnormal large bowel at the left pelvic inlet (series 2, images 73-78), but otherwise normal. Other: No pelvic free fluid. Musculoskeletal: No acute osseous abnormality identified. Lumbar spine degeneration maximal at L4-L5. IMPRESSION: 1. Positive for Acute Diverticulitis at the junction of the descending and sigmoid colon at the left pelvic inlet. This is inseparable from and might be adhered to the left ovary. But no abscess or other complicating features. 2. Lung base atelectasis.  Borderline to mild cardiomegaly. Electronically Signed   By: Genevie Ann M.D.   On: 11/18/2021 06:39   US PELVIC COMPLETE W  TRANSVAGINAL AND TORSION R/O  Result Date: 11/18/2021 CLINICAL DATA:  56 year old female with history of pelvic pain. EXAM: TRANSABDOMINAL ULTRASOUND OF PELVIS DOPPLER ULTRASOUND OF OVARIES TECHNIQUE: Transabdominal ultrasound examination of the pelvis was performed including evaluation of the uterus, ovaries, adnexal regions, and pelvic cul-de-sac. Color and duplex Doppler ultrasound was utilized to evaluate blood flow to the ovaries. COMPARISON:  No priors. FINDINGS: Uterus Status post hysterectomy. Endometrium N/A Right ovary Status post right oophorectomy. Left ovary Measurements: 2.2 x 1.7 x 1.9 cm = volume: 3.7 mL. Normal appearance/no adnexal mass. Pulsed Doppler evaluation demonstrates normal low-resistance arterial and venous waveforms in both ovaries. Other: No significant volume of free fluid. IMPRESSION: 1. No acute findings are noted to  account for the patient's symptoms. 2. Status post hysterectomy and right oophorectomy. Electronically Signed   By: Vinnie Langton M.D.   On: 11/18/2021 06:18     PROCEDURES:  Critical Care performed: No   CRITICAL CARE Performed by: Cyril Mourning Rosenda Geffrard   Total critical care time: 0 minutes  Critical care time was exclusive of separately billable procedures and treating other patients.  Critical care was necessary to treat or prevent imminent or life-threatening deterioration.  Critical care was time spent personally by me on the following activities: development of treatment plan with patient and/or surrogate as well as nursing, discussions with consultants, evaluation of patient's response to treatment, examination of patient, obtaining history from patient or surrogate, ordering and performing treatments and interventions, ordering and review of laboratory studies, ordering and review of radiographic studies, pulse oximetry and re-evaluation of patient's condition.   Procedures    IMPRESSION / MDM / ASSESSMENT AND PLAN / ED COURSE  I reviewed  the triage vital signs and the nursing notes.    Patient here with left-sided abdominal pain worsening over the past several days.     DIFFERENTIAL DIAGNOSIS (includes but not limited to):   Ovarian cyst, ovarian torsion, TOA, UTI, kidney stone, pyelonephritis, diverticulitis, colitis, bowel obstruction, less likely appendicitis   PLAN: We will obtain CBC, CMP, lipase, urinalysis, transvaginal ultrasound with Doppler, CT of the abdomen pelvis.  Will give IV fluids, morphine and Zofran for symptomatic relief.   MEDICATIONS GIVEN IN ED: Medications  HYDROmorphone (DILAUDID) injection 1 mg (has no administration in time range)  morphine (PF) 4 MG/ML injection 4 mg (4 mg Intravenous Given 11/18/21 0525)  ondansetron (ZOFRAN) injection 4 mg (4 mg Intravenous Given 11/18/21 0525)  sodium chloride 0.9 % bolus 1,000 mL (1,000 mLs Intravenous New Bag/Given 11/18/21 0530)  iohexol (OMNIPAQUE) 300 MG/ML solution 100 mL (100 mLs Intravenous Contrast Given 11/18/21 0619)     ED COURSE: Patient's labs show normal white blood cell count, hemoglobin, renal function, LFTs.  Lipase minimally elevated.  Urine shows some pyuria and bacteriuria but she is not having dysuria, frequency or urgency.  We will add on a urine culture given she is having lower abdominal pain.   CT reviewed by myself and radiologist and shows uncomplicated diverticulitis.  Transvaginal ultrasound also reviewed myself and radiologist and shows normal blood flow to the left ovary.  Patient's pain improved after morphine and Dilaudid.  We discussed that current recommendations are for bland, progressive diet and pain control at home and no longer for antibiotics for uncomplicated diverticulitis.  Patient verbalized understanding.  She is comfortable with plan for discharge home and would prefer this over admission.  We discussed return precautions, supportive care instructions and will discharge with prescriptions of Percocet and Zofran.  I  did send a prescription of Augmentin to her pharmacy however symptoms not improving in several days or she develops fevers.   At this time, I do not feel there is any life-threatening condition present. I reviewed all nursing notes, vitals, pertinent previous records.  All lab and urine results, EKGs, imaging ordered have been independently reviewed and interpreted by myself.  I reviewed all available radiology reports from any imaging ordered this visit.  Based on my assessment, I feel the patient is safe to be discharged home without further emergent workup and can continue workup as an outpatient as needed. Discussed all findings, treatment plan as well as usual and customary return precautions with patient.  They verbalize understanding and are  comfortable with this plan.  Outpatient follow-up has been provided as needed.  All questions have been answered.    CONSULTS: Offered admission to medicine for pain control however patient states she is feeling better, tolerating p.o. and would like to go home for management of her uncomplicated diverticulitis as an outpatient.   OUTSIDE RECORDS REVIEWED: Reviewed patient's dermatology note with Verdis Prime on 09/01/2017.         FINAL CLINICAL IMPRESSION(S) / ED DIAGNOSES   Final diagnoses:  Pelvic pain  LLQ pain  Acute diverticulitis     Rx / DC Orders   ED Discharge Orders          Ordered    oxyCODONE-acetaminophen (PERCOCET/ROXICET) 5-325 MG tablet  Every 6 hours PRN        11/18/21 0653    ondansetron (ZOFRAN-ODT) 4 MG disintegrating tablet  Every 6 hours PRN        11/18/21 0653    amoxicillin-clavulanate (AUGMENTIN) 875-125 MG tablet  2 times daily        11/18/21 0034             Note:  This document was prepared using Dragon voice recognition software and may include unintentional dictation errors.   Anica Alcaraz, Delice Bison, DO 11/18/21 0700

## 2021-11-18 NOTE — ED Triage Notes (Signed)
Pt states left pelvic pain for several days. Pt states pain has worsened today. Pt denies known hematuria, fever, vomiting, diarrhea, vaginal discharge.

## 2021-11-18 NOTE — ED Notes (Signed)
Patient to CT via stretcher.

## 2021-11-18 NOTE — ED Notes (Signed)
Patient provided with discharge instructions, script info and follow-up information. Patient verbalized understanding. Patient stated that her husband would be here shortly so this nurse ambulated with patient to the waiting room. She ambulated with a steady gait.

## 2021-11-18 NOTE — ED Notes (Signed)
Patient returned from [place]. Patient placed back on monitoring equipment. Patient states that the pain medication does not seem to be helping. Will notify the provider when she is available for further medication orders.

## 2021-11-19 LAB — URINE CULTURE

## 2021-11-22 ENCOUNTER — Other Ambulatory Visit: Payer: Self-pay | Admitting: Neurosurgery

## 2021-11-28 ENCOUNTER — Ambulatory Visit: Payer: No Typology Code available for payment source | Admitting: Podiatry

## 2021-11-28 ENCOUNTER — Other Ambulatory Visit: Payer: Self-pay

## 2021-11-28 DIAGNOSIS — M7661 Achilles tendinitis, right leg: Secondary | ICD-10-CM | POA: Diagnosis not present

## 2021-11-28 NOTE — Progress Notes (Signed)
?Subjective:  ?Patient ID: Brandy Ballard, female    DOB: 07-18-1966,  MRN: [id number] ? ?Chief Complaint  ?Patient presents with  ? Foot Pain  ?  Pt stated that her pain is about the same and the injection did not help  ? ? ?56 y.o. female presents with the above complaint.  Patient presents with follow-up to right Achilles tendinitis.  She states that is feeling a little bit better the injection did not help.  She is also being out of work for last 2 weeks due to diverticulitis.  She states that it still hurts the boot injection has not helped.  She would like to discuss next treatment plan. ? ?Review of Systems: Negative except as noted in the HPI. Denies N/V/F/Ch. ? ?Past Medical History:  ?Diagnosis Date  ? Abdominal pain, epigastric   ? Achilles tendinitis, right leg   ? Allergy   ? Anemia   ? Arthritis   ? lower back  ? Benign neoplasm of cecum   ? Blood transfusion without reported diagnosis   ? Dental bridge present   ? permanent lower retainer  ? Gastric polyp   ? GERD (gastroesophageal reflux disease)   ? Hashimoto's disease   ? Heel spur   ? bilateral  ? Plantar fasciitis   ? bilateral  ? Polyp of nasal sinus   ? multiple  ? Polyp of sigmoid colon   ? PONV (postoperative nausea and vomiting)   ? nausea only  ? Sinus disease   ? Thyroid disease   ? ? ?Current Outpatient Medications:  ?  acetaminophen (TYLENOL) 500 MG tablet, Take 1,000 mg by mouth daily. , Disp: , Rfl:  ?  LORazepam (ATIVAN) 0.5 MG tablet, Take 1-2 tablets 45 minutes before MRI, Disp: 30 tablet, Rfl: 0 ?  melatonin 5 MG TABS, Take 5 mg by mouth at bedtime., Disp: , Rfl:  ?  methocarbamol (ROBAXIN) 500 MG tablet, Take 1 tablet (500 mg total) by mouth every 6 (six) hours as needed for muscle spasms. (Patient not taking: Reported on 10/23/2021), Disp: 120 tablet, Rfl: 1 ?  mometasone (NASONEX) 50 MCG/ACT nasal spray, Place 2 sprays into the nose daily., Disp: 17 g, Rfl: 12 ?  mupirocin ointment (BACTROBAN) 2 %, mupirocin 2 % topical ointment,  Disp: , Rfl:  ?  ondansetron (ZOFRAN-ODT) 4 MG disintegrating tablet, Take 1 tablet (4 mg total) by mouth every 6 (six) hours as needed for nausea or vomiting., Disp: 20 tablet, Rfl: 0 ?  oxyCODONE-acetaminophen (PERCOCET/ROXICET) 5-325 MG tablet, Take 2 tablets by mouth every 6 (six) hours as needed., Disp: 20 tablet, Rfl: 0 ?  rizatriptan (MAXALT) 10 MG tablet, Take 1 tablet (10 mg total) by mouth as needed for migraine. May repeat in 2 hours if needed, Disp: 10 tablet, Rfl: 3 ? ?Social History  ? ?Tobacco Use  ?Smoking Status Former  ? Packs/day: 0.25  ? Years: 20.00  ? Pack years: 5.00  ? Types: Cigarettes  ? Quit date: 11/29/2018  ? Years since quitting: 3.0  ?Smokeless Tobacco Never  ?Tobacco Comments  ? down to 2-4 daily  ? ? ?Allergies  ?Allergen Reactions  ? Nsaids   ?  Avoids due to Hx Peptic ulcers  ? Clearasil Daily Clear Acne [Benzoyl Peroxide] Rash  ? ?Objective:  ?There were no vitals filed for this visit. ?There is no height or weight on file to calculate BMI. ?Constitutional Well developed. ?Well nourished.  ?Vascular Dorsalis pedis pulses palpable bilaterally. ?Posterior tibial pulses  palpable bilaterally. ?Capillary refill normal to all digits.  ?No cyanosis or clubbing noted. ?Pedal hair growth normal.  ?Neurologic Normal speech. ?Oriented to person, place, and time. ?Epicritic sensation to light touch grossly present bilaterally.  ?Dermatologic Nails well groomed and normal in appearance. ?No open wounds. ?No skin lesions.  ?Orthopedic: Pain on palpation to the right posterior heel.  Clinically palpable posterior spurring noted positive Silfverskiold test bilaterally with gastrocnemius equinus.  Tight plantar fascia noted.  No pain at the calcaneal tuber at this time bilaterally.  Haglund's deformity noted bilaterally  ? ?Radiographs: 3 views of skeletally mature the bilateral foot: Posterior spurring noted at the Achilles tendon insertion.  Os trigonum syndrome noted bilaterally.  Plantar heel  spur noted as well.  Pes cavus foot structure noted. ?Assessment:  ? ?1. Right Achilles tendinitis   ? ? ? ?Plan:  ?Patient was evaluated and treated and all questions answered. ? ?Right Achilles tendinitis with underlying equinus gastrocnemius ?-I explained to the patient the etiology of Achilles tendinitis and various treatment options were extensively discussed.   ?-Clinically her pain is not improved with steroid injection as well as cam boot immobilization ?-Continue using cam boot with even up on the other side to prevent back pain. ?-I believe she will benefit from an MRI evaluation to rule out Achilles tendon tearing.  She states understand like to proceed with an MRI ? ? ? ?Left plantar fibroma ?-I explained patient etiology of plantar fibroma and various treatment options were discussed.  She does have history of Planter fasciitis in the setting of tight plantar fascia leading to micro tearing/plantar fibroma.  Ultimately I believe she would benefit from a steroid injection however for now she may benefit from taking the pressure off of plantar fascia to take the stress of the plantar fibroma to see if that helps.  She wants to hold off a steroid injection as long as possible. ?-Plantar fascial brace was discussed with the left ? ?No follow-ups on file.  ?

## 2021-12-26 ENCOUNTER — Ambulatory Visit: Payer: No Typology Code available for payment source | Admitting: Podiatry

## 2021-12-30 ENCOUNTER — Ambulatory Visit: Payer: No Typology Code available for payment source | Admitting: Gastroenterology

## 2021-12-30 NOTE — Progress Notes (Deleted)
?  ?Jonathon Bellows MD, MRCP(U.K) ?[address]  ?[address]  ?[place], [place] [address]  ?Main: [phone]  ?Fax: [phone] ? ? ?Gastroenterology Consultation ? ?Referring Provider:     Valerie Roys, DO ?Primary Care Physician:  Valerie Roys, DO ?Primary Gastroenterologist:  Dr. Jonathon Bellows  ?Reason for Consultation: Diverticulitis ?      ? HPI:   ?Brandy Ballard is a 56 y.o. y/o female referred for consultation & management  by Dr. Wynetta Emery, Barb Merino, DO.   ? ?On 11/18/2021 she presented to the emergency room with abdominal pain.  Complained of bloating.  Underwent a CT scan of the abdomen pelvis with contrast that showed positive for acute diverticulitis at the junction of the descending and sigmoid colon.  No abscess noted.  Given instructions for conservative management and if not does not help commenced on Augmentin for the diverticulitis ?2019: colonoscopy : 2x sessile polyps  ? ? ?11/18/2021 hemoglobin 13.8 g CMP normal. ? ? ?Past Medical History:  ?Diagnosis Date  ? Abdominal pain, epigastric   ? Achilles tendinitis, right leg   ? Allergy   ? Anemia   ? Arthritis   ? lower back  ? Benign neoplasm of cecum   ? Blood transfusion without reported diagnosis   ? Dental bridge present   ? permanent lower retainer  ? Gastric polyp   ? GERD (gastroesophageal reflux disease)   ? Hashimoto's disease   ? Heel spur   ? bilateral  ? Plantar fasciitis   ? bilateral  ? Polyp of nasal sinus   ? multiple  ? Polyp of sigmoid colon   ? PONV (postoperative nausea and vomiting)   ? nausea only  ? Sinus disease   ? Thyroid disease   ? ? ?Past Surgical History:  ?Procedure Laterality Date  ? COLONOSCOPY WITH PROPOFOL N/A 07/19/2018  ? Procedure: COLONOSCOPY WITH Biopsies;  Surgeon: Lucilla Lame, MD;  Location: [facility];  Service: Endoscopy;  Laterality: N/A;  ? ESOPHAGOGASTRODUODENOSCOPY (EGD) WITH PROPOFOL N/A 11/09/2018  ? Procedure: ESOPHAGOGASTRODUODENOSCOPY (EGD) WITH BIOPSIES;  Surgeon: Virgel Manifold,  MD;  Location: [facility];  Service: Endoscopy;  Laterality: N/A;  ? FRONTAL SINUS EXPLORATION Bilateral 10/05/2018  ? Procedure: FRONTAL SINUS EXPLORATION;  Surgeon: Beverly Gust, MD;  Location: [facility];  Service: ENT;  Laterality: Bilateral;  ? IMAGE GUIDED SINUS SURGERY N/A 10/05/2018  ? Procedure: IMAGE GUIDED SINUS SURGERY;  Surgeon: Beverly Gust, MD;  Location: [facility];  Service: ENT;  Laterality: N/A;  ? NASAL TURBINATE REDUCTION  10/05/2018  ? Procedure: TURBINATE REDUCTION/SUBMUCOSAL RESECTION;  Surgeon: Beverly Gust, MD;  Location: [facility];  Service: ENT;;  ? OVARIAN CYST REMOVAL    ? POLYPECTOMY N/A 07/19/2018  ? Procedure: POLYPECTOMY;  Surgeon: Lucilla Lame, MD;  Location: ;  Service: Endoscopy;  Laterality: N/A;  ? SEPTOPLASTY WITH ETHMOIDECTOMY, AND MAXILLARY ANTROSTOMY Bilateral 10/05/2018  ? Procedure: SEPTOPLASTY WITH TOTAL ETHMOIDECTOMY, AND MAXILLARY ANTROSTOMY WITH TISSUE REMOVAL;  Surgeon: Beverly Gust, MD;  Location: [facility];  Service: ENT;  Laterality: Bilateral;  ? SPHENOIDECTOMY  10/05/2018  ? Procedure: TOTAL SPHENOIDECTOMY;  Surgeon: Beverly Gust, MD;  Location: [facility];  Service: ENT;;  ? TOTAL ABDOMINAL HYSTERECTOMY    ? ? ?Prior to Admission medications   ?Medication Sig Start Date End Date Taking? Authorizing Provider  ?acetaminophen (TYLENOL) 500 MG tablet Take 1,000 mg by mouth daily.     [provider]  ?LORazepam (ATIVAN) 0.5 MG tablet  Take 1-2 tablets 45 minutes before MRI 10/23/21   Genia Harold, MD  ?melatonin 5 MG TABS Take 5 mg by mouth at bedtime.    [provider]  ?methocarbamol (ROBAXIN) 500 MG tablet Take 1 tablet (500 mg total) by mouth every 6 (six) hours as needed for muscle spasms. ?Patient not taking: Reported on 10/23/2021 10/22/21   Park Liter P, DO  ?mometasone (NASONEX) 50 MCG/ACT nasal spray Place 2 sprays into the nose daily. 09/11/21   Park Liter P, DO  ?mupirocin ointment  (BACTROBAN) 2 % mupirocin 2 % topical ointment    [provider]  ?ondansetron (ZOFRAN-ODT) 4 MG disintegrating tablet Take 1 tablet (4 mg total) by mouth every 6 (six) hours as needed for nausea or vomiting. 11/18/21   Ward, Delice Bison, DO  ?oxyCODONE-acetaminophen (PERCOCET/ROXICET) 5-325 MG tablet Take 2 tablets by mouth every 6 (six) hours as needed. 11/18/21   Ward, Delice Bison, DO  ?rizatriptan (MAXALT) 10 MG tablet Take 1 tablet (10 mg total) by mouth as needed for migraine. May repeat in 2 hours if needed 10/23/21   Genia Harold, MD  ?atorvastatin (LIPITOR) 20 MG tablet Take 1 tablet (20 mg total) by mouth daily. 08/19/18 05/29/19  Trinna Post, PA-C  ? ? ?Family History  ?Problem Relation Age of Onset  ? Meniere's disease Mother   ? Chiari malformation Mother   ? Hypertension Father   ? Atrial fibrillation Father   ? CVA Father   ? Breast cancer Paternal Grandmother 2  ? Heart attack Paternal Grandfather   ? CVA Paternal Grandfather   ? ADD / ADHD Daughter   ? Polycystic ovary syndrome Daughter   ? Breast cancer Paternal Aunt   ?     over 29  ?  ? ?Social History  ? ?Tobacco Use  ? Smoking status: Former  ?  Packs/day: 0.25  ?  Years: 20.00  ?  Pack years: 5.00  ?  Types: Cigarettes  ?  Quit date: 11/29/2018  ?  Years since quitting: 3.0  ? Smokeless tobacco: Never  ? Tobacco comments:  ?  down to 2-4 daily  ?Vaping Use  ? Vaping Use: Never used  ?Substance Use Topics  ? Alcohol use: Not Currently  ? Drug use: Never  ? ? ?Allergies as of 12/30/2021 - Review Complete 11/28/2021  ?Allergen Reaction Noted  ? Nsaids  11/02/2018  ? Clearasil daily clear acne [benzoyl peroxide] Rash 10/05/2018  ? ? ?Review of Systems:    ?All systems reviewed and negative except where noted in HPI. ? ? Physical Exam:  ?There were no vitals taken for this visit. ?No LMP recorded. Patient has had a hysterectomy. ?Psych:  Alert and cooperative. Normal mood and affect. ?General:   Alert,  Well-developed, well-nourished,  pleasant and cooperative in NAD ?Head:  Normocephalic and atraumatic. ?Eyes:  Sclera clear, no icterus.   Conjunctiva pink. ?Ears:  Normal auditory acuity. ?Neck:  Supple; no masses or thyromegaly. ?Lungs:  Respirations even and unlabored.  Clear throughout to auscultation.   No wheezes, crackles, or rhonchi. No acute distress. ?Heart:  Regular rate and rhythm; no murmurs, clicks, rubs, or gallops. ?Abdomen:  Normal bowel sounds.  No bruits.  Soft, non-tender and non-distended without masses, hepatosplenomegaly or hernias noted.  No guarding or rebound tenderness.    ?Neurologic:  Alert and oriented x3;  grossly normal neurologically. ?Psych:  Alert and cooperative. Normal mood and affect. ? ?Imaging Studies: ?No results found. ? ?Assessment and  Plan:  ? ?Brandy Ballard is a 56 y.o. y/o female has been referred for an episode of uncomplicated diverticulitis which took her to the emergency room in February 2023 at the junction of the descending and sigmoid colon.  Last colonoscopy was in 2019. ? ?Plan ?1.  Colonoscopy.  ? ?Follow up in *** ? ?Dr Jonathon Bellows MD,MRCP(U.K) ? ?

## 2022-01-30 ENCOUNTER — Encounter: Payer: Self-pay | Admitting: Psychiatry

## 2022-01-30 ENCOUNTER — Ambulatory Visit: Payer: No Typology Code available for payment source | Admitting: Psychiatry

## 2022-03-15 IMAGING — US US PELVIS COMPLETE TRANSABD/TRANSVAG W DUPLEX AND/OR DOPPLER
1 series · 14 of 25 positions shown · non-contrast
Comparison: No priors.

CLINICAL DATA: 55-year-old female with history of pelvic pain.

EXAM:
TRANSABDOMINAL ULTRASOUND OF PELVIS
DOPPLER ULTRASOUND OF OVARIES
TECHNIQUE: Transabdominal ultrasound examination of the pelvis was performed
including evaluation of the uterus, ovaries, adnexal regions, and
pelvic cul-de-sac.
Color and duplex Doppler ultrasound was utilized to evaluate blood
flow to the ovaries.

[Series 1: us pelvic complete w transvaginal and torsion righ · 14 of 53 slices shown]
[im 1/53]
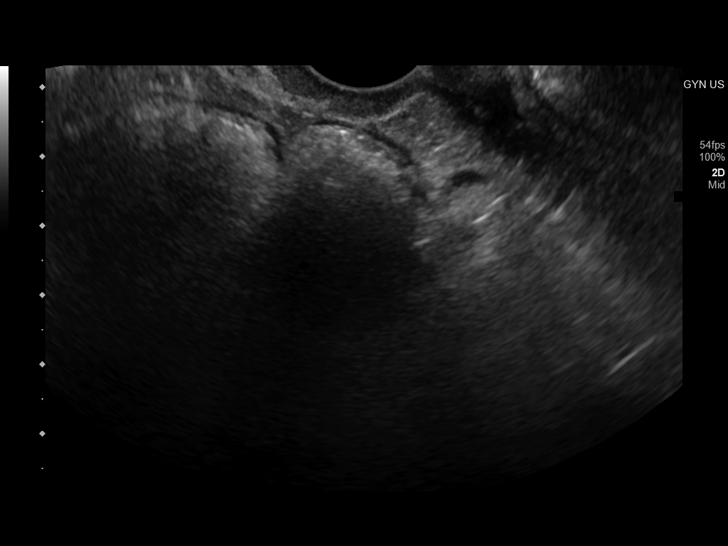
[im 5/53]
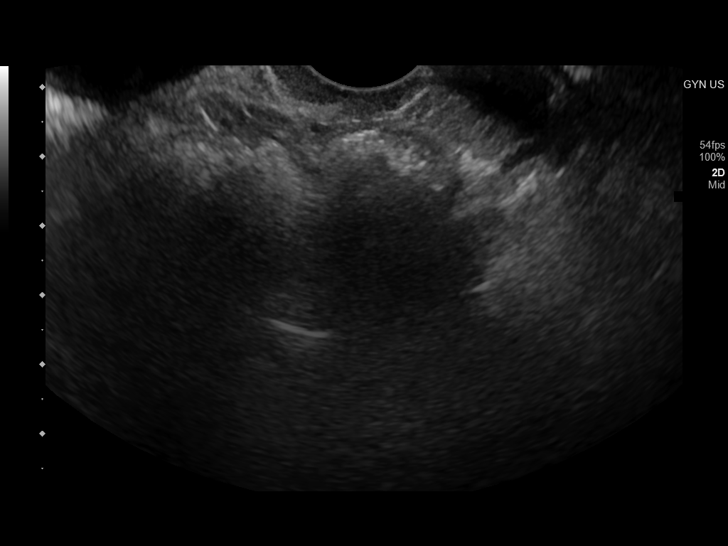
[im 9/53]
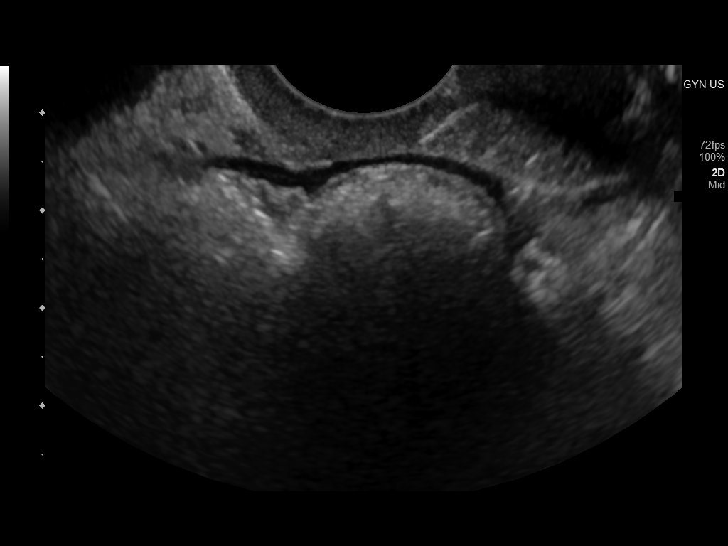
[im 14/53]
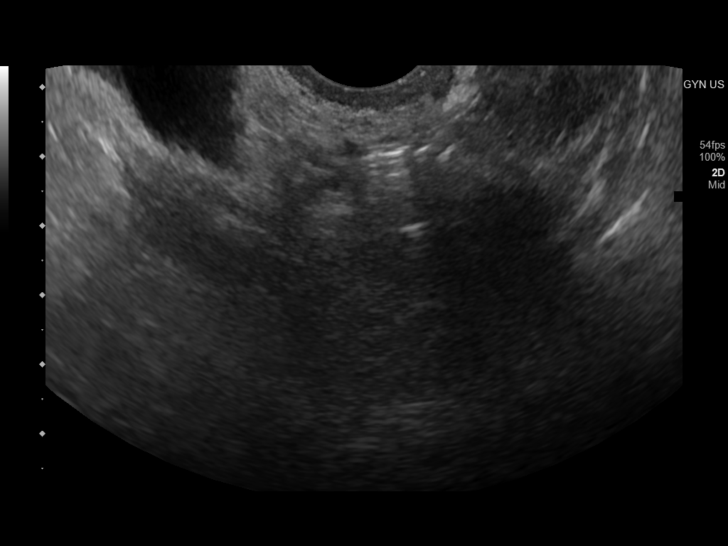
[im 18/53]
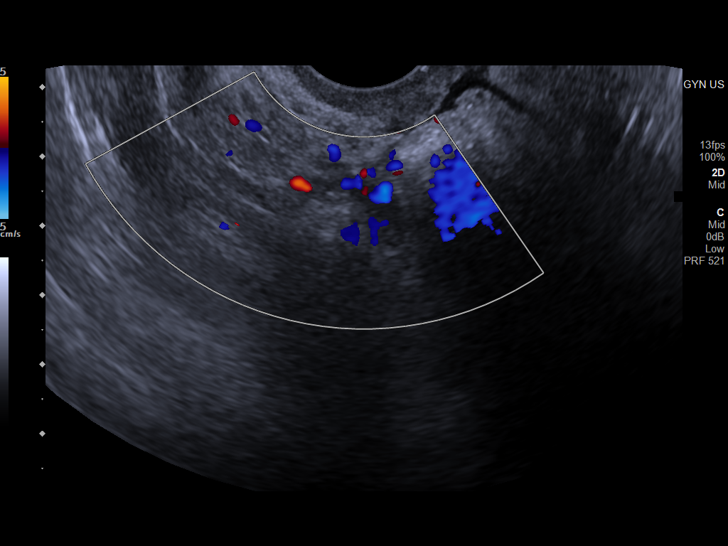
[im 20/53]
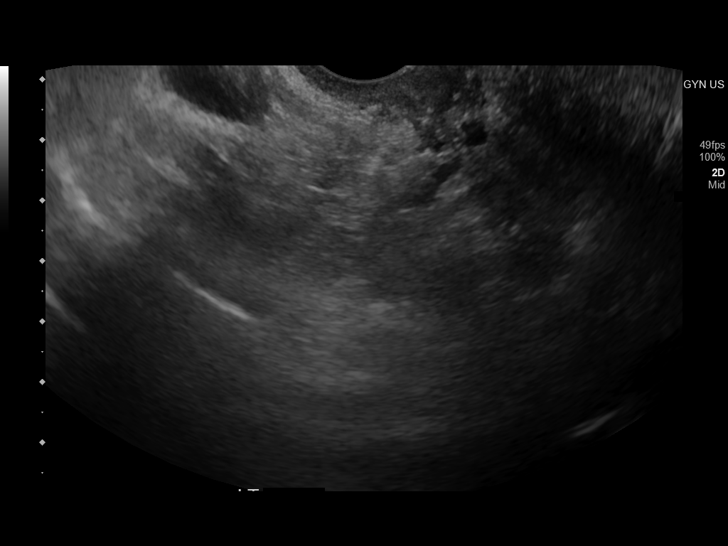
[im 24/53]
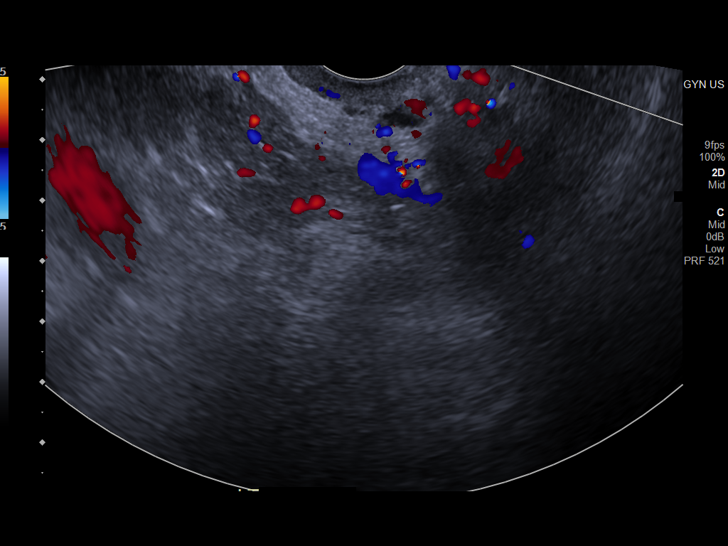
[im 29/53]
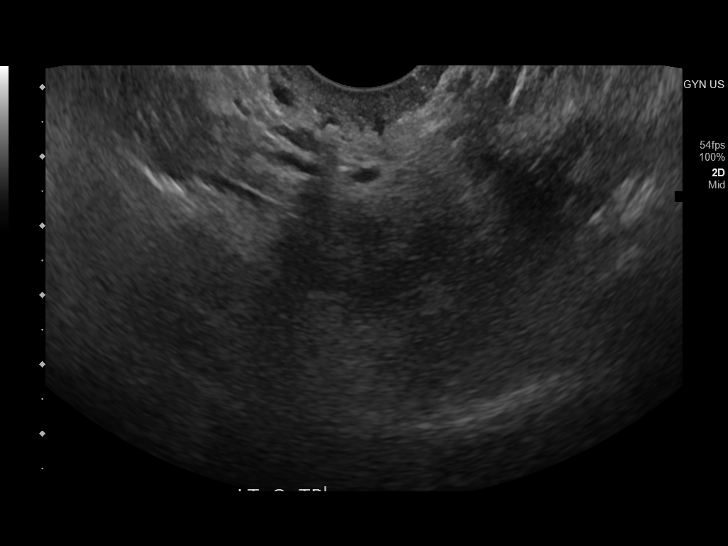
[im 33/53]
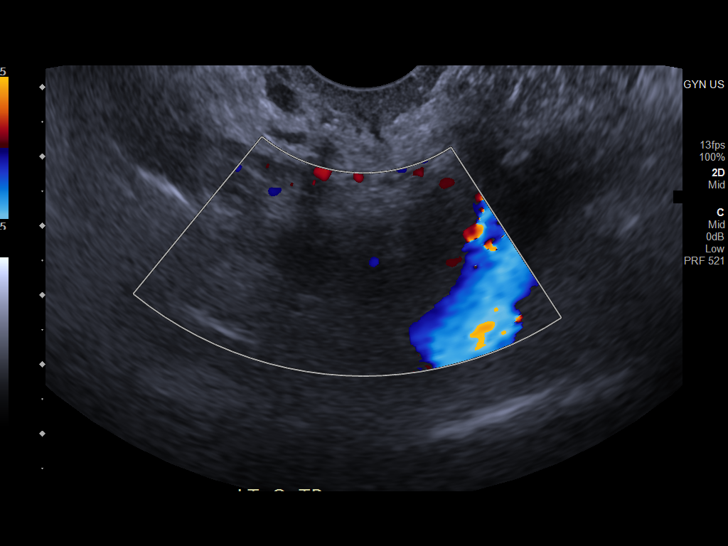
[im 35/53]
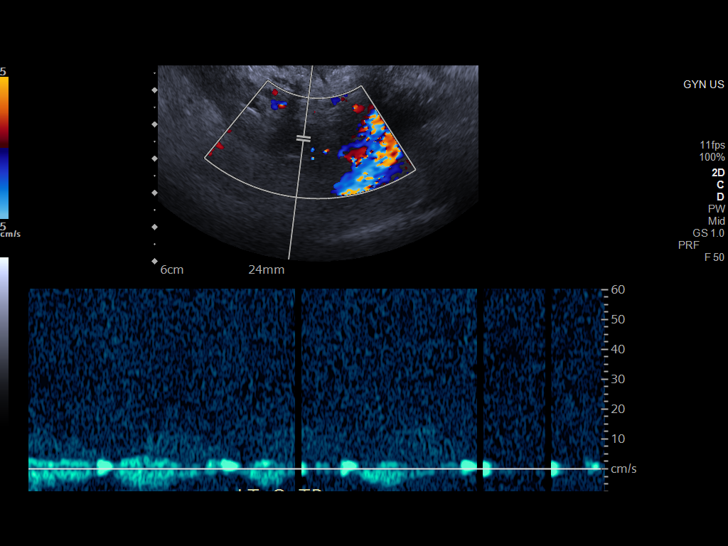
[im 40/53]
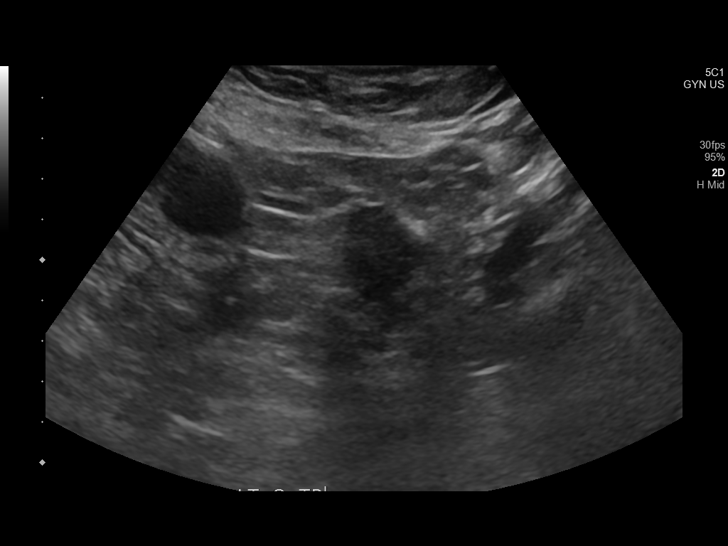
[im 44/53]
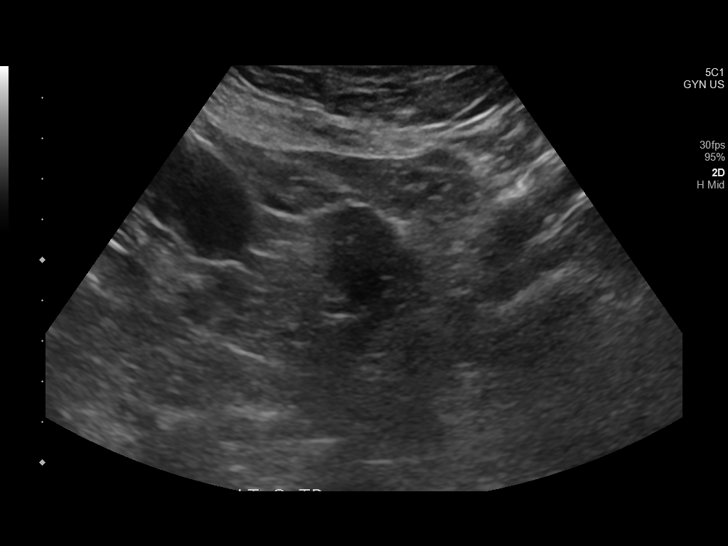
[im 48/53]
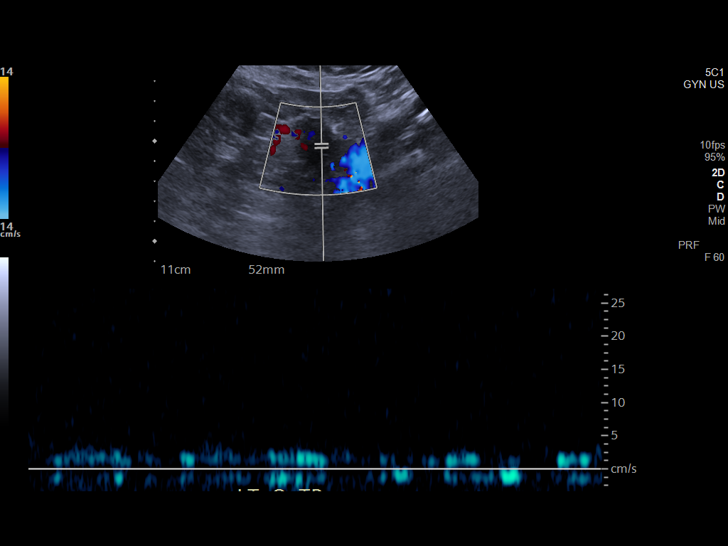
[im 53/53]
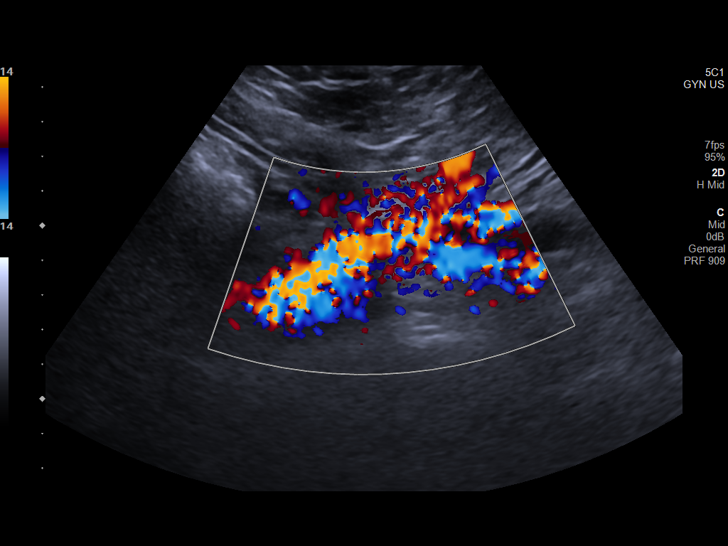

[14 of 25 positions shown; findings below may reference images not displayed]

FINDINGS: Uterus

Status post hysterectomy.

Endometrium

N/A

Right ovary

Status post right oophorectomy.

Left ovary

Measurements: 2.2 x 1.7 x 1.9 cm = volume: 3.7 mL. Normal
appearance/no adnexal mass.

Pulsed Doppler evaluation demonstrates normal low-resistance
arterial and venous waveforms in both ovaries.

Other: No significant volume of free fluid.
IMPRESSION: 1. No acute findings are noted to account for the patient's
symptoms.
2. Status post hysterectomy and right oophorectomy.

## 2022-04-21 ENCOUNTER — Telehealth: Payer: No Typology Code available for payment source | Admitting: Physician Assistant

## 2022-04-21 ENCOUNTER — Other Ambulatory Visit: Payer: Self-pay

## 2022-04-21 DIAGNOSIS — L210 Seborrhea capitis: Secondary | ICD-10-CM

## 2022-04-21 MED ORDER — KETOCONAZOLE 2 % EX SHAM
1.0000 | MEDICATED_SHAMPOO | CUTANEOUS | 0 refills | Status: DC
  Filled 2022-04-21: qty 120, 20d supply, fill #0

## 2022-04-21 MED ORDER — BETAMETHASONE DIPROPIONATE 0.05 % EX EMUL
CUTANEOUS | 0 refills | Status: DC
  Filled 2022-04-21: qty 240, fill #0

## 2022-04-21 MED ORDER — BETAMETHASONE DIPROPIONATE 0.05 % EX OINT
TOPICAL_OINTMENT | Freq: Two times a day (BID) | CUTANEOUS | 0 refills | Status: DC
  Filled 2022-04-21: qty 45, 15d supply, fill #0

## 2022-04-21 NOTE — Patient Instructions (Signed)
[employer], thank you for joining Mar Daring, PA-C for today's virtual visit.  While this provider is not your primary care provider (PCP), if your PCP is located in our provider database this encounter information will be shared with them immediately following your visit.  Consent: (Patient) Brandy Ballard provided verbal consent for this virtual visit at the beginning of the encounter.  Current Medications:  Current Outpatient Medications:    betamethasone dipropionate (DIPROLENE) 0.05 % ointment, Apply topically 2 (two) times daily., Disp: 50 g, Rfl: 0   Betamethasone Dipropionate 0.05 % EMUL, Apply a small amount twice daily to affected area, Disp: 240 mL, Rfl: 0   ketoconazole (NIZORAL) 2 % shampoo, Apply 1 Application topically 2 (two) times a week., Disp: 120 mL, Rfl: 0   acetaminophen (TYLENOL) 500 MG tablet, Take 1,000 mg by mouth daily. , Disp: , Rfl:    LORazepam (ATIVAN) 0.5 MG tablet, Take 1-2 tablets 45 minutes before MRI, Disp: 30 tablet, Rfl: 0   melatonin 5 MG TABS, Take 5 mg by mouth at bedtime., Disp: , Rfl:    methocarbamol (ROBAXIN) 500 MG tablet, Take 1 tablet (500 mg total) by mouth every 6 (six) hours as needed for muscle spasms. (Patient not taking: Reported on 10/23/2021), Disp: 120 tablet, Rfl: 1   mometasone (NASONEX) 50 MCG/ACT nasal spray, Place 2 sprays into the nose daily., Disp: 17 g, Rfl: 12   mupirocin ointment (BACTROBAN) 2 %, mupirocin 2 % topical ointment, Disp: , Rfl:    ondansetron (ZOFRAN-ODT) 4 MG disintegrating tablet, Take 1 tablet (4 mg total) by mouth every 6 (six) hours as needed for nausea or vomiting., Disp: 20 tablet, Rfl: 0   oxyCODONE-acetaminophen (PERCOCET/ROXICET) 5-325 MG tablet, Take 2 tablets by mouth every 6 (six) hours as needed., Disp: 20 tablet, Rfl: 0   rizatriptan (MAXALT) 10 MG tablet, Take 1 tablet (10 mg total) by mouth as needed for migraine. May repeat in 2 hours if needed, Disp: 10 tablet, Rfl: 3   Medications ordered  in this encounter:  Meds ordered this encounter  Medications   Betamethasone Dipropionate 0.05 % EMUL    Sig: Apply a small amount twice daily to affected area    Dispense:  240 mL    Refill:  0    Order Specific Question:   Supervising Provider    Answer:   Sabra Heck, BRIAN [[id number]]   betamethasone dipropionate (DIPROLENE) 0.05 % ointment    Sig: Apply topically 2 (two) times daily.    Dispense:  50 g    Refill:  0    Order Specific Question:   Supervising Provider    Answer:   MILLER, BRIAN [[id number]]   ketoconazole (NIZORAL) 2 % shampoo    Sig: Apply 1 Application topically 2 (two) times a week.    Dispense:  120 mL    Refill:  0    Order Specific Question:   Supervising Provider    Answer:   Sabra Heck, BRIAN [[id number]]     *If you need refills on other medications prior to your next appointment, please contact your pharmacy*  Follow-Up: Call back or seek an in-person evaluation if the symptoms worsen or if the condition fails to improve as anticipated.  Other Instructions Seborrheic Dermatitis, Adult Seborrheic dermatitis is a skin disease that causes red, scaly patches. It usually occurs on the scalp, and it is often called dandruff. The patches may appear on other parts of the body. Skin patches tend to appear where there  are many oil glands in the skin. Areas of the body that are commonly affected include the: Scalp. Ears. Eyebrows. Face. Bearded area of [employer]. Skin folds of the body, such as the armpits, groin, and buttocks. Chest. The condition may come and go for no known reason, and it is often long-lasting (chronic). What are the causes? The cause of this condition is not known. What increases the risk? The following factors may make you more likely to develop this condition: Having certain conditions, such as: HIV (human immunodeficiency virus). AIDS (acquired immunodeficiency syndrome). Parkinson's disease. Mood disorders, such as depression. Being 68-60 years  old. What are the signs or symptoms? Symptoms of this condition include: Thick scales on the scalp. Redness on the face or in the armpits. Skin that is flaky. The flakes may be white or yellow. Skin that seems oily or dry but is not helped with moisturizers. Itching or burning in the affected areas. How is this diagnosed? This condition is diagnosed with a medical history and physical exam. A sample of your skin may be tested (skin biopsy). You may need to see a skin specialist (dermatologist). How is this treated? There is no cure for this condition, but treatment can help to manage the symptoms. You may get treatment to remove scales, lower the risk of skin infection, and reduce swelling or itching. Treatment may include: Creams that reduce skin yeast. Medicated shampoo. Moisturizing creams or ointments. Creams that reduce swelling and irritation (steroids). Follow these instructions at home: Apply over-the-counter and prescription medicines only as told by your health care provider. Use any medicated shampoo, skin creams, or ointments only as told by your health care provider. Keep all follow-up visits as told by your health care provider. This is important. Contact a health care provider if: Your symptoms do not improve with treatment. Your symptoms get worse. You have new symptoms. Get help right away if: Your condition rapidly worsens with treatment. Summary Seborrheic dermatitis is a skin disease that causes red, scaly patches. Seborrheic dermatitis commonly affects the scalp, face, and skin folds. There is no cure for this condition, but treatment can help to manage the symptoms. This information is not intended to replace advice given to you by your health care provider. Make sure you discuss any questions you have with your health care provider. Document Revised: 06/23/2019 Document Reviewed: 06/23/2019 Elsevier Patient Education  [place].    If you have been  instructed to have an in-person evaluation today at a local Urgent Care facility, please use the link below. It will take you to a list of all of our available [facility], including address, phone number and hours of operation. Please do not delay care.  [facility]  If you or a family member do not have a primary care provider, use the link below to schedule a visit and establish care. When you choose a [facility] primary care physician or advanced practice provider, you gain a long-term partner in health. Find a Primary Care Provider  Learn more about [facility] in-office and virtual care options: [facility]

## 2022-04-21 NOTE — Progress Notes (Signed)
Virtual Visit Consent   Brandy Ballard, you are scheduled for a virtual visit with a [facility] provider today. Just as with appointments in the office, your consent must be obtained to participate. Your consent will be active for this visit and any virtual visit you may have with one of our providers in the next 365 days. If you have a MyChart account, a copy of this consent can be sent to you electronically.  As this is a virtual visit, video technology does not allow for your provider to perform a traditional examination. This may limit your provider's ability to fully assess your condition. If your provider identifies any concerns that need to be evaluated in person or the need to arrange testing (such as labs, EKG, etc.), we will make arrangements to do so. Although advances in technology are sophisticated, we cannot ensure that it will always work on either your end or our end. If the connection with a video visit is poor, the visit may have to be switched to a telephone visit. With either a video or telephone visit, we are not always able to ensure that we have a secure connection.  By engaging in this virtual visit, you consent to the provision of healthcare and authorize for your insurance to be billed (if applicable) for the services provided during this visit. Depending on your insurance coverage, you may receive a charge related to this service.  I need to obtain your verbal consent now. Are you willing to proceed with your visit today? Brandy Ballard has provided verbal consent on 04/21/2022 for a virtual visit (video or telephone). Mar Daring, PA-C  Date: 04/21/2022 1:43 PM  Virtual Visit via Video Note   I, Mar Daring, connected with  Brandy Ballard  ([id number], 1966/09/27) on 04/21/22 at  1:30 PM EDT by a video-enabled telemedicine application and verified that I am speaking with the correct person using two identifiers.  Location: Patient: Virtual Visit Location Patient:  Home Provider: Virtual Visit Location Provider: Home Office   I discussed the limitations of evaluation and management by telemedicine and the availability of in person appointments. The patient expressed understanding and agreed to proceed.    History of Present Illness: Brandy Ballard is a 56 y.o. who identifies as a female who was assigned female at birth, and is being seen today for rash.  HPI: Rash This is a recurrent problem. The problem has been gradually worsening since onset. The affected locations include the scalp and neck. The rash is characterized by blistering, itchiness, dryness and scaling. Associated with: stress. Pertinent negatives include no facial edema, fever, shortness of breath or sore throat. Past treatments include moisturizer and topical steroids. The treatment provided no relief.      Problems:  Patient Active Problem List   Diagnosis Date Noted   Hyperlipidemia 09/17/2021   Migraine without status migrainosus, not intractable 09/12/2021   Sinus disease 09/12/2021   History of Hashimoto thyroiditis 09/11/2021   Osteoarthritis of spine with radiculopathy, lumbar region 09/11/2021   Spinal stenosis of lumbar region 09/11/2021   Gastroesophageal reflux disease    Tobacco abuse 08/18/2018   Osteoarthritis 05/17/2013   Esophageal reflux 05/17/2013   Hypothyroidism 05/17/2013    Allergies:  Allergies  Allergen Reactions   Nsaids     Avoids due to Hx Peptic ulcers   Clearasil Daily Clear Acne [Benzoyl Peroxide] Rash   Medications:  Current Outpatient Medications:    betamethasone dipropionate (DIPROLENE) 0.05 % ointment, Apply topically 2 (  two) times daily., Disp: 50 g, Rfl: 0   Betamethasone Dipropionate 0.05 % EMUL, Apply a small amount twice daily to affected area, Disp: 240 mL, Rfl: 0   ketoconazole (NIZORAL) 2 % shampoo, Apply 1 Application topically 2 (two) times a week., Disp: 120 mL, Rfl: 0   acetaminophen (TYLENOL) 500 MG tablet, Take 1,000 mg by  mouth daily. , Disp: , Rfl:    LORazepam (ATIVAN) 0.5 MG tablet, Take 1-2 tablets 45 minutes before MRI, Disp: 30 tablet, Rfl: 0   melatonin 5 MG TABS, Take 5 mg by mouth at bedtime., Disp: , Rfl:    methocarbamol (ROBAXIN) 500 MG tablet, Take 1 tablet (500 mg total) by mouth every 6 (six) hours as needed for muscle spasms. (Patient not taking: Reported on 10/23/2021), Disp: 120 tablet, Rfl: 1   mometasone (NASONEX) 50 MCG/ACT nasal spray, Place 2 sprays into the nose daily., Disp: 17 g, Rfl: 12   mupirocin ointment (BACTROBAN) 2 %, mupirocin 2 % topical ointment, Disp: , Rfl:    ondansetron (ZOFRAN-ODT) 4 MG disintegrating tablet, Take 1 tablet (4 mg total) by mouth every 6 (six) hours as needed for nausea or vomiting., Disp: 20 tablet, Rfl: 0   oxyCODONE-acetaminophen (PERCOCET/ROXICET) 5-325 MG tablet, Take 2 tablets by mouth every 6 (six) hours as needed., Disp: 20 tablet, Rfl: 0   rizatriptan (MAXALT) 10 MG tablet, Take 1 tablet (10 mg total) by mouth as needed for migraine. May repeat in 2 hours if needed, Disp: 10 tablet, Rfl: 3  Observations/Objective: Patient is well-developed, well-nourished in no acute distress.  Resting comfortably at home.  Head is normocephalic, atraumatic.  No labored breathing. Speech is clear and coherent with logical content.  Patient is alert and oriented at baseline.    Assessment and Plan: 1. Seborrhea capitis in adult - Betamethasone Dipropionate 0.05 % EMUL; Apply a small amount twice daily to affected area  Dispense: 240 mL; Refill: 0 - betamethasone dipropionate (DIPROLENE) 0.05 % ointment; Apply topically 2 (two) times daily.  Dispense: 50 g; Refill: 0 - ketoconazole (NIZORAL) 2 % shampoo; Apply 1 Application topically 2 (two) times a week.  Dispense: 120 mL; Refill: 0  - Worsening recurrent issue - Reports has responded well to betamethasone and ketoconazole in the past - Will start with betamethasone emulsion (if available) to calm down flare -  Betamethasone ointment prescribed if the emulsion is not available. - Ketoconazole shampoo to treat  - Seek in person evaluation if symptoms persist or worsen  Follow Up Instructions: I discussed the assessment and treatment plan with the patient. The patient was provided an opportunity to ask questions and all were answered. The patient agreed with the plan and demonstrated an understanding of the instructions.  A copy of instructions were sent to the patient via MyChart unless otherwise noted below.    The patient was advised to call back or seek an in-person evaluation if the symptoms worsen or if the condition fails to improve as anticipated.  Time:  I spent 12 minutes with the patient via telehealth technology discussing the above problems/concerns.    Mar Daring, PA-C

## 2022-05-27 ENCOUNTER — Emergency Department: Payer: No Typology Code available for payment source

## 2022-05-27 ENCOUNTER — Encounter: Payer: Self-pay | Admitting: Emergency Medicine

## 2022-05-27 ENCOUNTER — Other Ambulatory Visit: Payer: Self-pay

## 2022-05-27 ENCOUNTER — Emergency Department
Admission: EM | Admit: 2022-05-27 | Discharge: 2022-05-27 | Disposition: A | Discharge status: Home / Self Care | Payer: No Typology Code available for payment source | Attending: Emergency Medicine | Admitting: Emergency Medicine

## 2022-05-27 DIAGNOSIS — R1032 Left lower quadrant pain: Secondary | ICD-10-CM | POA: Insufficient documentation

## 2022-05-27 DIAGNOSIS — R112 Nausea with vomiting, unspecified: Secondary | ICD-10-CM | POA: Diagnosis not present

## 2022-05-27 LAB — COMPREHENSIVE METABOLIC PANEL
ALT: 42 U/L (ref 0–44)
AST: 27 U/L (ref 15–41)
Albumin: 3.9 g/dL (ref 3.5–5.0)
Alkaline Phosphatase: 53 U/L (ref 38–126)
Anion gap: 7 (ref 5–15)
BUN: 12 mg/dL (ref 6–20)
CO2: 24 mmol/L (ref 22–32)
Calcium: 8.8 mg/dL — ABNORMAL LOW (ref 8.9–10.3)
Chloride: 107 mmol/L (ref 98–111)
Creatinine, Ser: 0.52 mg/dL (ref 0.44–1.00)
GFR, Estimated: >60 mL/min (ref >60)
Glucose, Bld: 112 mg/dL — ABNORMAL HIGH (ref 70–99)
Potassium: 3.7 mmol/L (ref 3.5–5.1)
Sodium: 138 mmol/L (ref 135–145)
Total Bilirubin: 0.8 mg/dL (ref 0.3–1.2)
Total Protein: 7.5 g/dL (ref 6.5–8.1)

## 2022-05-27 LAB — URINALYSIS, ROUTINE W REFLEX MICROSCOPIC
Bilirubin Urine: NEGATIVE
Glucose, UA: NEGATIVE mg/dL
Hgb urine dipstick: NEGATIVE
Ketones, ur: NEGATIVE mg/dL
Leukocytes,Ua: NEGATIVE
Nitrite: NEGATIVE
Protein, ur: NEGATIVE mg/dL
Specific Gravity, Urine: >1.046 — ABNORMAL HIGH (ref 1.005–1.030)
pH: 5 (ref 5.0–8.0)

## 2022-05-27 LAB — CBC
HCT: 44.4 % (ref 36.0–46.0)
Hemoglobin: 14.8 g/dL (ref 12.0–15.0)
MCH: 31.3 pg (ref 26.0–34.0)
MCHC: 33.3 g/dL (ref 30.0–36.0)
MCV: 93.9 fL (ref 80.0–100.0)
Platelets: 319 10*3/uL (ref 150–400)
RBC: 4.73 MIL/uL (ref 3.87–5.11)
RDW: 12.6 % (ref 11.5–15.5)
WBC: 8.5 10*3/uL (ref 4.0–10.5)
nRBC: 0 % (ref 0.0–0.2)

## 2022-05-27 LAB — LIPASE, BLOOD: Lipase: 44 U/L (ref 11–51)

## 2022-05-27 MED ORDER — METOCLOPRAMIDE HCL 10 MG PO TABS
10.0000 mg | ORAL_TABLET | Freq: Three times a day (TID) | ORAL | 1 refills | Status: DC | PRN
  Filled 2022-05-27: qty 30, 10d supply, fill #0

## 2022-05-27 MED ORDER — ONDANSETRON HCL 4 MG/2ML IJ SOLN
4.0000 mg | Freq: Once | INTRAMUSCULAR | Status: AC
  Administered 2022-05-27: 4 mg via INTRAVENOUS
  Filled 2022-05-27: qty 2

## 2022-05-27 MED ORDER — DICYCLOMINE HCL 20 MG PO TABS
20.0000 mg | ORAL_TABLET | Freq: Three times a day (TID) | ORAL | 1 refills | Status: DC
  Filled 2022-05-27: qty 30, 8d supply, fill #0

## 2022-05-27 MED ORDER — IOHEXOL 300 MG/ML  SOLN
100.0000 mL | Freq: Once | INTRAMUSCULAR | Status: AC | PRN
Start: 2022-05-27 — End: 2022-05-27
  Administered 2022-05-27: 100 mL via INTRAVENOUS

## 2022-05-27 NOTE — ED Triage Notes (Signed)
Pt presents via POV with complaints of LLQ onset 4 hours ago with associated nausea. Denies vomiting. Hx of Diverticulitis - flare up 6 months ago. No meds taken PTA. Denies CP or SOB.

## 2022-05-27 NOTE — ED Provider Notes (Signed)
[facility] Provider Note    Event Date/Time   First MD Initiated Contact with Patient 05/27/22 [phone]     (approximate)   History   No chief complaint on file.   HPI  Brandy Ballard is a 56 y.o. female who presents to the ED for evaluation of No chief complaint on file.   Patient presents to the ED for evaluation of an episode of LLQ abdominal pain.  She reports pain started last night around 10 PM, about 8 hours ago.  She reports some mild nausea and cramping sensation without any emesis, diarrhea, stool changes, dysuria, vaginal discharge or bleeding.  She reports the pain is reminiscent of when she had diverticulitis in the past that is concerned about this.   Physical Exam   Triage Vital Signs: ED Triage Vitals  Enc Vitals Group     BP 05/27/22 0218 (!) 149/93     Pulse Rate 05/27/22 0218 (!) 104     Resp 05/27/22 0218 18     Temp 05/27/22 0218 98 F (36.7 C)     Temp Source 05/27/22 0218 Oral     SpO2 05/27/22 0218 98 %     Weight 05/27/22 0213 212 lb (96.2 kg)     Height 05/27/22 0213 '5\' 6"'$  (1.676 m)     Head Circumference --      Peak Flow --      Pain Score 05/27/22 0213 8     Pain Loc --      Pain Edu? --      Excl. in [place]? --     Most recent vital signs: Vitals:   05/27/22 0218 05/27/22 0545  BP: (!) 149/93 (!) 158/96  Pulse: (!) 104 81  Resp: 18 18  Temp: 98 F (36.7 C)   SpO2: 98% 97%    General: Awake, no distress.  CV:  Good peripheral perfusion.  Resp:  Normal effort.  Abd:  No distention.  Mild LLQ tenderness without peritoneal features.  Abdomen is otherwise benign. MSK:  No deformity noted.  Neuro:  No focal deficits appreciated. Other:     ED Results / Procedures / Treatments   Labs (all labs ordered are listed, but only abnormal results are displayed) Labs Reviewed  COMPREHENSIVE METABOLIC PANEL - Abnormal; Notable for the following components:      Result Value   Glucose, Bld 112 (*)    Calcium 8.8 (*)     All other components within normal limits  URINALYSIS, ROUTINE W REFLEX MICROSCOPIC - Abnormal; Notable for the following components:   Color, Urine STRAW (*)    APPearance CLEAR (*)    Specific Gravity, Urine >1.046 (*)    All other components within normal limits  LIPASE, BLOOD  CBC    EKG   RADIOLOGY CT abdomen/pelvis interpreted by me without evidence of acute intra-abdominal pathology.  Official radiology report(s): CT ABDOMEN PELVIS W CONTRAST  Result Date: 05/27/2022 CLINICAL DATA:  Left lower quadrant pain for several hours, history of diverticulitis EXAM: CT ABDOMEN AND PELVIS WITH CONTRAST TECHNIQUE: Multidetector CT imaging of the abdomen and pelvis was performed using the standard protocol following bolus administration of intravenous contrast. RADIATION DOSE REDUCTION: This exam was performed according to the departmental dose-optimization program which includes automated exposure control, adjustment of the mA and/or kV according to patient size and/or use of iterative reconstruction technique. CONTRAST:  135m OMNIPAQUE IOHEXOL 300 MG/ML  SOLN COMPARISON:  11/18/2021 FINDINGS: Lower chest: No acute abnormality.  Hepatobiliary: Mild fatty infiltration of the liver is noted. A cyst is noted along the falciform ligament. The gallbladder is within normal limits. Pancreas: Unremarkable. No pancreatic ductal dilatation or surrounding inflammatory changes. Spleen: Normal in size without focal abnormality. Adrenals/Urinary Tract: Adrenal glands are within normal limits. Kidneys demonstrate a normal enhancement pattern bilaterally. No renal calculi or obstructive changes are seen. Delayed images demonstrate normal excretion of contrast. Bladder is well distended. Stomach/Bowel: Scattered diverticular change of the colon is noted without evidence of diverticulitis. The appendix is within normal limits. Small bowel and stomach are unremarkable. Vascular/Lymphatic: Aortic atherosclerosis. No  enlarged abdominal or pelvic lymph nodes. Reproductive: Status post hysterectomy. No adnexal masses. Other: No abdominal wall hernia or abnormality. No abdominopelvic ascites. Musculoskeletal: No acute or significant osseous findings. IMPRESSION: Diverticulosis without diverticulitis. Fatty liver. Hepatic cyst.  No follow-up is recommended. Electronically Signed   By: Inez Catalina M.D.   On: 05/27/2022 03:35    PROCEDURES and INTERVENTIONS:  Procedures  Medications  ondansetron (ZOFRAN) injection 4 mg (4 mg Intravenous Given 05/27/22 0236)  iohexol (OMNIPAQUE) 300 MG/ML solution 100 mL (100 mLs Intravenous Contrast Given 05/27/22 0313)     IMPRESSION / MDM / ASSESSMENT AND PLAN / ED COURSE  I reviewed the triage vital signs and the nursing notes.  Differential diagnosis includes, but is not limited to, diverticulitis, appendicitis, IBS, dehydration, cystitis, pancreatitis  {Patient presents with symptoms of an acute illness or injury that is potentially life-threatening.  56 year old woman presents to the ED with acute LLQ abdominal pain without evidence of acute pathology and suitable for outpatient management.  Look systemically well.  Some mild tenderness but no peritoneal features.  Blood work is benign with a normal CBC, metabolic panel and lipase.  Urine without infectious features.  CT without evidence of diverticulitis or further intra-abdominal pathology.  Pain is improving without intervention.  We discussed Reglan and dicyclomine as well as return precautions.  She has history of ovarian cysts and has a residual left-sided ovary despite hysterectomy and right oophorectomy.  I offered the patient a pelvic ultrasound to assess for left ovarian pathology or torsion, but she declines this offer acknowledging the risk of undiagnosed pathology.      FINAL CLINICAL IMPRESSION(S) / ED DIAGNOSES   Final diagnoses:  LLQ abdominal pain     Rx / DC Orders   ED Discharge Orders           Ordered    metoCLOPramide (REGLAN) 10 MG tablet  Every 8 hours PRN        05/27/22 0625    dicyclomine (BENTYL) 20 MG tablet  3 times daily before meals & bedtime        05/27/22 2330             Note:  This document was prepared using Dragon voice recognition software and may include unintentional dictation errors.   Vladimir Crofts, MD 05/27/22 [phone]

## 2022-05-27 NOTE — ED Notes (Signed)
Patient discharged to home per MD order. Patient in stable condition, and deemed medically cleared by ED provider for discharge. Discharge instructions reviewed with patient/family using "Teach Back"; verbalized understanding of medication education and administration, and information about follow-up care. Denies further concerns. ° °

## 2022-05-28 ENCOUNTER — Ambulatory Visit (INDEPENDENT_AMBULATORY_CARE_PROVIDER_SITE_OTHER): Payer: No Typology Code available for payment source | Admitting: Family Medicine

## 2022-05-28 ENCOUNTER — Encounter: Payer: Self-pay | Admitting: Family Medicine

## 2022-05-28 ENCOUNTER — Other Ambulatory Visit: Payer: Self-pay

## 2022-05-28 VITALS — BP 109/75 | HR 98 | Temp 98.5°F | Wt 215.7 lb

## 2022-05-28 DIAGNOSIS — K760 Fatty (change of) liver, not elsewhere classified: Secondary | ICD-10-CM | POA: Insufficient documentation

## 2022-05-28 DIAGNOSIS — K5790 Diverticulosis of intestine, part unspecified, without perforation or abscess without bleeding: Secondary | ICD-10-CM

## 2022-05-28 DIAGNOSIS — I700 Atherosclerosis of aorta: Secondary | ICD-10-CM | POA: Diagnosis not present

## 2022-05-28 DIAGNOSIS — M4726 Other spondylosis with radiculopathy, lumbar region: Secondary | ICD-10-CM

## 2022-05-28 DIAGNOSIS — R1032 Left lower quadrant pain: Secondary | ICD-10-CM | POA: Diagnosis not present

## 2022-05-28 DIAGNOSIS — L309 Dermatitis, unspecified: Secondary | ICD-10-CM

## 2022-05-28 MED ORDER — PREDNISONE 10 MG PO TABS
ORAL_TABLET | ORAL | 0 refills | Status: DC
  Filled 2022-05-28: qty 21, 6d supply, fill #0

## 2022-05-28 MED ORDER — CLOTRIMAZOLE-BETAMETHASONE 1-0.05 % EX CREA
1.0000 | TOPICAL_CREAM | Freq: Every day | CUTANEOUS | 0 refills | Status: DC
  Filled 2022-05-28: qty 30, 30d supply, fill #0

## 2022-05-28 NOTE — Progress Notes (Signed)
BP 109/75   Pulse 98   Temp 98.5 F (36.9 C)   Wt 215 lb 11.2 oz (97.8 kg)   SpO2 97%   BMI 34.81 kg/m    Subjective:    Patient ID: Brandy Ballard, female    DOB: 05-11-1966, 56 y.o.   MRN: [id number]  HPI: Brandy Ballard is a 56 y.o. female  Chief Complaint  Patient presents with   Abdominal Pain    Patient states she had another flare up of diverticulitis on Sunday, went to ED Monday. Per patient she feels better today.    Hair/Scalp Problem    Patient states her scalp on the back of her head is itchy, has used prescribed medications that she got during a virtual visit.    Back Pain    Patient states she has been having lower back pain, going down left leg for a few months.    ER FOLLOW UP Time since discharge: 1 day Hospital/facility: [facility] Diagnosis: LLQ pain Procedures/tests:  Narrative & Impression  CLINICAL DATA:  Left lower quadrant pain for several hours, history of diverticulitis   EXAM: CT ABDOMEN AND PELVIS WITH CONTRAST   TECHNIQUE: Multidetector CT imaging of the abdomen and pelvis was performed using the standard protocol following bolus administration of intravenous contrast.   RADIATION DOSE REDUCTION: This exam was performed according to the departmental dose-optimization program which includes automated exposure control, adjustment of the mA and/or kV according to patient size and/or use of iterative reconstruction technique.   CONTRAST:  157m OMNIPAQUE IOHEXOL 300 MG/ML  SOLN   COMPARISON:  11/18/2021   FINDINGS: Lower chest: No acute abnormality.   Hepatobiliary: Mild fatty infiltration of the liver is noted. A cyst is noted along the falciform ligament. The gallbladder is within normal limits.   Pancreas: Unremarkable. No pancreatic ductal dilatation or surrounding inflammatory changes.   Spleen: Normal in size without focal abnormality.   Adrenals/Urinary Tract: Adrenal glands are within normal limits. Kidneys demonstrate a normal  enhancement pattern bilaterally. No renal calculi or obstructive changes are seen. Delayed images demonstrate normal excretion of contrast. Bladder is well distended.   Stomach/Bowel: Scattered diverticular change of the colon is noted without evidence of diverticulitis. The appendix is within normal limits. Small bowel and stomach are unremarkable.   Vascular/Lymphatic: Aortic atherosclerosis. No enlarged abdominal or pelvic lymph nodes.   Reproductive: Status post hysterectomy. No adnexal masses.   Other: No abdominal wall hernia or abnormality. No abdominopelvic ascites.   Musculoskeletal: No acute or significant osseous findings.   IMPRESSION: Diverticulosis without diverticulitis.   Fatty liver.   Hepatic cyst.  No follow-up is recommended.   Consultants: None New medications: Reglan and bentyl Discharge instructions:  Follow up here Status: better  Since getting out of the ER, she notes that she has been feeling a lot better. She notes that she has been feeling well with doing the FODMAP diet since her first diverticulitis in February  Back is about the same. She is supposed to have surgery in October, but feels like she's going to have to cancel due to cost.  BACK PAIN Duration: chronic Mechanism of injury: no trauma Location: low back, L side sciatica Onset: gradual Severity: moderate Quality: shooting, aching Frequency: constant Radiation: L leg Aggravating factors: movement, standing Alleviating factors: rest Status: worse Treatments attempted: rest, ice, heat, APAP, ibuprofen, aleve, physical therapy, and HEP  Relief with NSAIDs?: mild Nighttime pain:  yes Paresthesias / decreased sensation:  yes Bowel / bladder incontinence:  no Fevers:  no Dysuria / urinary frequency:  no  RASH Duration:  chronic  Location: back of her head  Itching: yes Burning: yes Redness: yes Oozing: yes Scaling: yes Blisters: yes Painful: yes Fevers: no Change in  detergents/soaps/personal care products: no Recent illness: no Recent travel:no History of same: yes Context: stable Alleviating factors: lotion/moisturizer Treatments attempted:OTC anit-fungal and lotion/moisturizer Shortness of breath: no  Throat/tongue swelling: no Myalgias/arthralgias: no  Had a draining cyst under L breast. This is now resolved.   Relevant past medical, surgical, family and social history reviewed and updated as indicated. Interim medical history since our last visit reviewed. Allergies and medications reviewed and updated.  Review of Systems  Constitutional: Negative.   Respiratory: Negative.    Cardiovascular: Negative.   Gastrointestinal:  Positive for abdominal pain and nausea. Negative for abdominal distention, anal bleeding, blood in stool, constipation, diarrhea, rectal pain and vomiting.  Musculoskeletal: Negative.   Skin: Negative.   Neurological: Negative.   Psychiatric/Behavioral: Negative.      Per HPI unless specifically indicated above     Objective:    BP 109/75   Pulse 98   Temp 98.5 F (36.9 C)   Wt 215 lb 11.2 oz (97.8 kg)   SpO2 97%   BMI 34.81 kg/m   Wt Readings from Last 3 Encounters:  05/28/22 215 lb 11.2 oz (97.8 kg)  05/27/22 212 lb (96.2 kg)  11/18/21 225 lb (102.1 kg)    Physical Exam Vitals and nursing note reviewed.  Constitutional:      General: She is not in acute distress.    Appearance: Normal appearance. She is obese. She is not ill-appearing, toxic-appearing or diaphoretic.  HENT:     Head: Normocephalic and atraumatic.     Right Ear: External ear normal.     Left Ear: External ear normal.     Nose: Nose normal.     Mouth/Throat:     Mouth: Mucous membranes are moist.     Pharynx: Oropharynx is clear.  Eyes:     General: No scleral icterus.       Right eye: No discharge.        Left eye: No discharge.     Extraocular Movements: Extraocular movements intact.     Conjunctiva/sclera: Conjunctivae  normal.     Pupils: Pupils are equal, round, and reactive to light.  Cardiovascular:     Rate and Rhythm: Normal rate and regular rhythm.     Pulses: Normal pulses.     Heart sounds: Normal heart sounds. No murmur heard.    No friction rub. No gallop.  Pulmonary:     Effort: Pulmonary effort is normal. No respiratory distress.     Breath sounds: Normal breath sounds. No stridor. No wheezing, rhonchi or rales.  Chest:     Chest wall: No tenderness.  Musculoskeletal:        General: Normal range of motion.     Cervical back: Normal range of motion and neck supple.  Skin:    General: Skin is warm and dry.     Capillary Refill: Capillary refill takes less than 2 seconds.     Coloration: Skin is not jaundiced or pale.     Findings: Rash (erythematous rash on back of head) present. No bruising, erythema or lesion.  Neurological:     General: No focal deficit present.     Mental Status: She is alert and oriented to person, place, and time. Mental status is at baseline.  Psychiatric:        Mood and Affect: Mood normal.        Behavior: Behavior normal.        Thought Content: Thought content normal.        Judgment: Judgment normal.     Results for orders placed or performed during the hospital encounter of 05/27/22  Lipase, blood  Result Value Ref Range   Lipase 44 11 - 51 U/L  Comprehensive metabolic panel  Result Value Ref Range   Sodium 138 135 - 145 mmol/L   Potassium 3.7 3.5 - 5.1 mmol/L   Chloride 107 98 - 111 mmol/L   CO2 24 22 - 32 mmol/L   Glucose, Bld 112 (H) 70 - 99 mg/dL   BUN 12 6 - 20 mg/dL   Creatinine, Ser 0.52 0.44 - 1.00 mg/dL   Calcium 8.8 (L) 8.9 - 10.3 mg/dL   Total Protein 7.5 6.5 - 8.1 g/dL   Albumin 3.9 3.5 - 5.0 g/dL   AST 27 15 - 41 U/L   ALT 42 0 - 44 U/L   Alkaline Phosphatase 53 38 - 126 U/L   Total Bilirubin 0.8 0.3 - 1.2 mg/dL   GFR, Estimated >60 >60 mL/min   Anion gap 7 5 - 15  CBC  Result Value Ref Range   WBC 8.5 4.0 - 10.5 K/uL    RBC 4.73 3.87 - 5.11 MIL/uL   Hemoglobin 14.8 12.0 - 15.0 g/dL   HCT 44.4 36.0 - 46.0 %   MCV 93.9 80.0 - 100.0 fL   MCH 31.3 26.0 - 34.0 pg   MCHC 33.3 30.0 - 36.0 g/dL   RDW 12.6 11.5 - 15.5 %   Platelets 319 150 - 400 K/uL   nRBC 0.0 0.0 - 0.2 %  Urinalysis, Routine w reflex microscopic  Result Value Ref Range   Color, Urine STRAW (A) YELLOW   APPearance CLEAR (A) CLEAR   Specific Gravity, Urine >1.046 (H) 1.005 - 1.030   pH 5.0 5.0 - 8.0   Glucose, UA NEGATIVE NEGATIVE mg/dL   Hgb urine dipstick NEGATIVE NEGATIVE   Bilirubin Urine NEGATIVE NEGATIVE   Ketones, ur NEGATIVE NEGATIVE mg/dL   Protein, ur NEGATIVE NEGATIVE mg/dL   Nitrite NEGATIVE NEGATIVE   Leukocytes,Ua NEGATIVE NEGATIVE      Assessment & Plan:   Problem List Items Addressed This Visit       Cardiovascular and Mediastinum   Aortic atherosclerosis ([place])    Will keep BP and cholesterol under good control. Continue to monitor. Call with any concerns.         Digestive   Fatty liver    Encouraged diet and exercise. Call with any concerns.       Diverticulosis    Continue current diet. Call with any concerns.         Nervous and Auditory   Osteoarthritis of spine with radiculopathy, lumbar region - Primary   Relevant Medications   predniSONE (DELTASONE) 10 MG tablet   Other Visit Diagnoses     LLQ abdominal pain       Doing much better. Will continue bentyl. Call if acting up again.    Eczema, unspecified type       Will treat with prednisone. Can use lotrisone in future if needed.         Follow up plan: Return in about 5 months (around 10/28/2022) for physical.

## 2022-05-28 NOTE — Assessment & Plan Note (Signed)
Encouraged diet and exercise. Call with any concerns.

## 2022-05-28 NOTE — Assessment & Plan Note (Signed)
Will keep BP and cholesterol under good control. Continue to monitor. Call with any concerns.  

## 2022-05-28 NOTE — Assessment & Plan Note (Signed)
Continue current diet. Call with any concerns.

## 2022-06-30 ENCOUNTER — Ambulatory Visit: Admit: 2022-06-30 | Payer: No Typology Code available for payment source | Admitting: Neurosurgery

## 2022-06-30 SURGERY — POSTERIOR LUMBAR FUSION 1 LEVEL
Anesthesia: General | Site: Back

## 2023-02-03 ENCOUNTER — Other Ambulatory Visit: Payer: Self-pay

## 2023-02-03 ENCOUNTER — Telehealth: Payer: 59 | Admitting: Family Medicine

## 2023-02-03 DIAGNOSIS — H60549 Acute eczematoid otitis externa, unspecified ear: Secondary | ICD-10-CM

## 2023-02-03 DIAGNOSIS — H9202 Otalgia, left ear: Secondary | ICD-10-CM | POA: Diagnosis not present

## 2023-02-03 MED ORDER — PREDNISONE 10 MG PO TABS
ORAL_TABLET | ORAL | 0 refills | Status: AC
  Filled 2023-02-03: qty 21, 6d supply, fill #0

## 2023-02-03 MED ORDER — AMOXICILLIN-POT CLAVULANATE 875-125 MG PO TABS
1.0000 | ORAL_TABLET | Freq: Two times a day (BID) | ORAL | 0 refills | Status: DC
  Filled 2023-02-03: qty 14, 7d supply, fill #0

## 2023-02-03 NOTE — Patient Instructions (Signed)
Brandy Ballard, thank you for joining Freddy Finner, NP for today's virtual visit.  While this provider is not your primary care provider (PCP), if your PCP is located in our provider database this encounter information will be shared with them immediately following your visit.   A [facility] MyChart account gives you access to today's visit and all your visits, tests, and labs performed at [facility] " click here if you don't have a [facility] MyChart account or go to [email or web address]  Consent: (Patient) Brandy Ballard provided verbal consent for this virtual visit at the beginning of the encounter.  Current Medications:  Current Outpatient Medications:    [START ON 02/09/2023] amoxicillin-clavulanate (AUGMENTIN) 875-125 MG tablet, Take 1 tablet by mouth 2 (two) times daily for 7 days., Disp: 14 tablet, Rfl: 0   predniSONE (STERAPRED UNI-PAK 21 TAB) 10 MG (21) TBPK tablet, Take as directed, Disp: 21 tablet, Rfl: 0   clotrimazole-betamethasone (LOTRISONE) cream, Apply 1 Application topically daily., Disp: 30 g, Rfl: 0   ketoconazole (NIZORAL) 2 % shampoo, Apply 1 Application topically 2 (two) times a week., Disp: 120 mL, Rfl: 0   melatonin 5 MG TABS, Take 5 mg by mouth at bedtime., Disp: , Rfl:    mometasone (NASONEX) 50 MCG/ACT nasal spray, Place 2 sprays into the nose daily., Disp: 17 g, Rfl: 12   Medications ordered in this encounter:  Meds ordered this encounter  Medications   predniSONE (STERAPRED UNI-PAK 21 TAB) 10 MG (21) TBPK tablet    Sig: Take as directed    Dispense:  21 tablet    Refill:  0    Order Specific Question:   Supervising Provider    Answer:   Merrilee Jansky [[id number]]   amoxicillin-clavulanate (AUGMENTIN) 875-125 MG tablet    Sig: Take 1 tablet by mouth 2 (two) times daily for 7 days.    Dispense:  14 tablet    Refill:  0    Order Specific Question:   Supervising Provider    Answer:   Merrilee Jansky [id number]     *If you need refills  on other medications prior to your next appointment, please contact your pharmacy*  Follow-Up: Call back or seek an in-person evaluation if the symptoms worsen or if the condition fails to improve as anticipated.   Virtual Care [phone]  Other Instructions  Eczema Eczema refers to a group of skin conditions that cause skin to become rough and inflamed. Each type of eczema has different triggers, symptoms, and treatments. Eczema of any type is usually itchy. Symptoms range from mild to severe. Eczema is not spread from person to person (is not contagious). It can appear on different parts of the body at different times. One person's eczema may look different from another person's eczema. What are the causes? The exact cause of this condition is not known. However, exposure to certain environmental factors, irritants, and allergens can make the condition worse. What are the signs or symptoms? Symptoms of this condition depend on the type of eczema you have. The types include: Contact dermatitis. There are two kinds: Irritant contact dermatitis. This happens when something irritates the skin and causes a rash. Allergic contact dermatitis. This happens when your skin comes in contact with something you are allergic to (allergens). This can include poison ivy, chemicals, or medicines that were applied to your skin. Atopic dermatitis. This is a long-term (chronic) skin disease that keeps coming back (recurring). It is the most common  type of eczema. Usual symptoms are a red rash and itchy, dry, scaly skin. It usually starts showing signs in infancy and can last through adulthood. Dyshidrotic eczema. This is a form of eczema on the hands and feet. It shows up as very itchy, fluid-filled blisters. It can affect people of any age but is more common before age 51. Hand eczema. This causes very itchy areas of skin on the palms and sides of the hands and fingers. This type of eczema is  common in industrial jobs where you may be exposed to different types of irritants. Lichen simplex chronicus. This type of eczema occurs when a person constantly scratches one area of the body. Repeated scratching of the area leads to thickened skin (lichenification). This condition can accompany other types of eczema. It is more common in adults but may also be seen in children. Nummular eczema. This is a common type of eczema that most often affects the lower legs and the backs of the hands. It typically causes an itchy, red, circular, crusty lesion (plaque). Scratching may become a habit and can cause bleeding. Nummular eczema occurs most often in middle-aged or older people. Seborrheic dermatitis. This is a common skin disease that mainly affects the scalp. It may also affect other oily areas of the body, such as the face, sides of the nose, eyebrows, ears, eyelids, and chest. It is marked by small scaling and redness of the skin (erythema). This can affect people of all ages. In infants, this condition is called cradle cap. Stasis dermatitis. This is a common skin disease that can cause itching, scaling, and hyperpigmentation, usually on the legs and feet. It occurs most often in people who have a condition that prevents blood from being pumped through the veins in the legs (chronic venous insufficiency). Stasis dermatitis is a chronic condition that needs long-term management. How is this diagnosed? This condition may be diagnosed based on: A physical exam of your skin. Your medical history. Skin patch tests. These tests involve using patches that contain possible allergens and placing them on your back. Your health care provider will check in a few days to see if an allergic reaction occurred. How is this treated? Treatment for eczema is based on the type of eczema you have. You may be given hydrocortisone steroid medicine or antihistamines. These can relieve itching quickly and help reduce  inflammation. These may be prescribed or purchased over the counter, depending on the strength that is needed. Follow these instructions at home: Take or apply over-the-counter and prescription medicines only as told by your health care provider. Use creams or ointments to moisturize your skin. Do not use lotions. Learn what triggers or irritates your symptoms so you can avoid these things. Treat symptom flare-ups quickly. Do not scratch your skin. This can make your rash worse. Keep all follow-up visits. This is important. Where to find more information American Academy of Dermatology: MarketingSheets.si National Eczema Association: nationaleczema.org The Society for Pediatric Dermatology: pedsderm.net Contact a health care provider if: You have severe itching, even with treatment. You scratch your skin regularly until it bleeds. Your rash looks different than usual. Your skin is painful, swollen, or more red than usual. You have a fever. Summary Eczema refers to a group of skin conditions that cause skin to become rough and inflamed. Each type has different triggers. Eczema of any type causes itching that may range from mild to severe. Treatment varies based on the type of eczema you have. Hydrocortisone steroid  medicine or antihistamines can help with itching and inflammation. Protecting your skin is the best way to prevent eczema. Use creams or ointments to moisturize your skin. Avoid triggers and irritants. Treat flare-ups quickly. This information is not intended to replace advice given to you by your health care provider. Make sure you discuss any questions you have with your health care provider. Document Revised: 06/25/2020 Document Reviewed: 06/25/2020 Elsevier Patient Education  2023 Elsevier Inc.    If you have been instructed to have an in-person evaluation today at a local Urgent Care facility, please use the link below. It will take you to a list of all of our available [facility], including address, phone number and hours of operation. Please do not delay care.  [facility]  If you or a family member do not have a primary care provider, use the link below to schedule a visit and establish care. When you choose a [facility] primary care physician or advanced practice provider, you gain a long-term partner in health. Find a Primary Care Provider  Learn more about [facility]'s in-office and virtual care options: [place] - Get Care Now

## 2023-02-03 NOTE — Progress Notes (Signed)
Virtual Visit Consent   Brandy Ballard, you are scheduled for a virtual visit with a [facility] provider today. Just as with appointments in the office, your consent must be obtained to participate. Your consent will be active for this visit and any virtual visit you may have with one of our providers in the next 365 days. If you have a MyChart account, a copy of this consent can be sent to you electronically.  As this is a virtual visit, video technology does not allow for your provider to perform a traditional examination. This may limit your provider's ability to fully assess your condition. If your provider identifies any concerns that need to be evaluated in person or the need to arrange testing (such as labs, EKG, etc.), we will make arrangements to do so. Although advances in technology are sophisticated, we cannot ensure that it will always work on either your end or our end. If the connection with a video visit is poor, the visit may have to be switched to a telephone visit. With either a video or telephone visit, we are not always able to ensure that we have a secure connection.  By engaging in this virtual visit, you consent to the provision of healthcare and authorize for your insurance to be billed (if applicable) for the services provided during this visit. Depending on your insurance coverage, you may receive a charge related to this service.  I need to obtain your verbal consent now. Are you willing to proceed with your visit today? Brandy Ballard has provided verbal consent on 02/03/2023 for a virtual visit (video or telephone). Freddy Finner, NP  Date: 02/03/2023 11:14 AM  Virtual Visit via Video Note   I, Freddy Finner, connected with  Brandy Ballard  ([id number], 1965-10-27) on 02/03/23 at 11:15 AM EDT by a video-enabled telemedicine application and verified that I am speaking with the correct person using two identifiers.  Location: Patient: Virtual Visit Location Patient: Home Provider:  Virtual Visit Location Provider: Home Office   I discussed the limitations of evaluation and management by telemedicine and the availability of in person appointments. The patient expressed understanding and agreed to proceed.    History of Present Illness: Brandy Ballard is a 57 y.o. who identifies as a female who was assigned female at birth, and is being seen today for ear pain  Onset was itching and some bumps Friday- 4 days ago Associated symptoms are some swelling occurred yesterday- can feel it is swollen and muffled hearing.  Modifying factors are was using Lotrisone cream did not work this time Denies chest pain, shortness of breath, fevers, chills   Problems:  Patient Active Problem List   Diagnosis Date Noted   Aortic atherosclerosis (HCC) 05/28/2022   Fatty liver 05/28/2022   Diverticulosis 05/28/2022   Hyperlipidemia 09/17/2021   Migraine without status migrainosus, not intractable 09/12/2021   Sinus disease 09/12/2021   History of Hashimoto thyroiditis 09/11/2021   Osteoarthritis of spine with radiculopathy, lumbar region 09/11/2021   Spinal stenosis of lumbar region 09/11/2021   Gastroesophageal reflux disease    Tobacco abuse 08/18/2018   Osteoarthritis 05/17/2013   Esophageal reflux 05/17/2013   Hypothyroidism 05/17/2013    Allergies:  Allergies  Allergen Reactions   Nsaids     Avoids due to Hx Peptic ulcers   Clearasil Daily Clear Acne [Benzoyl Peroxide] Rash   Medications:  Current Outpatient Medications:    acetaminophen (TYLENOL) 500 MG tablet, Take 1,000 mg by mouth daily. ,  Disp: , Rfl:    betamethasone dipropionate (DIPROLENE) 0.05 % ointment, Apply topically 2 (two) times daily., Disp: 45 g, Rfl: 0   Betamethasone Dipropionate 0.05 % EMUL, Apply a small amount twice daily to affected area, Disp: 240 mL, Rfl: 0   clotrimazole-betamethasone (LOTRISONE) cream, Apply 1 Application topically daily., Disp: 30 g, Rfl: 0   dicyclomine (BENTYL) 20 MG tablet,  Take 1 tablet (20 mg total) by mouth 4 (four) times daily -  before meals and at bedtime., Disp: 30 tablet, Rfl: 1   ketoconazole (NIZORAL) 2 % shampoo, Apply 1 Application topically 2 (two) times a week., Disp: 120 mL, Rfl: 0   melatonin 5 MG TABS, Take 5 mg by mouth at bedtime., Disp: , Rfl:    methocarbamol (ROBAXIN) 500 MG tablet, Take 1 tablet (500 mg total) by mouth every 6 (six) hours as needed for muscle spasms., Disp: 120 tablet, Rfl: 1   metoCLOPramide (REGLAN) 10 MG tablet, Take 1 tablet (10 mg total) by mouth every 8 (eight) hours as needed for nausea., Disp: 30 tablet, Rfl: 1   mometasone (NASONEX) 50 MCG/ACT nasal spray, Place 2 sprays into the nose daily., Disp: 17 g, Rfl: 12   predniSONE (DELTASONE) 10 MG tablet, 6 tabs day 1, 5 tabs day 2, decrease by 1 every day until gone, Disp: 21 tablet, Rfl: 0  Observations/Objective: Patient is well-developed, well-nourished in no acute distress.  Resting comfortably  at home.  Head is normocephalic, atraumatic.  No labored breathing.  Speech is clear and coherent with logical content.  Patient is alert and oriented at baseline.    Assessment and Plan:   1. Left ear pain  - predniSONE (STERAPRED UNI-PAK 21 TAB) 10 MG (21) TBPK tablet; Take as directed  Dispense: 21 tablet; Refill: 0 - amoxicillin-clavulanate (AUGMENTIN) 875-125 MG tablet; Take 1 tablet by mouth 2 (two) times daily for 7 days.  Dispense: 14 tablet; Refill: 0  2. Eczema of external auditory canal  - predniSONE (STERAPRED UNI-PAK 21 TAB) 10 MG (21) TBPK tablet; Take as directed  Dispense: 21 tablet; Refill: 0  Known eczema of ear canal started to scratch and now some swelling in the area Given this and the muffled sound will do prednisone -lower suspicion for ear infection as of now- but delayed Rx in a week if that has occurred secondary to skin scratches -strict in person precautions reviewed  Reviewed side effects, risks and benefits of medication.     Patient acknowledged agreement and understanding of the plan.   Past Medical, Surgical, Social History, Allergies, and Medications have been Reviewed.   Follow Up Instructions: I discussed the assessment and treatment plan with the patient. The patient was provided an opportunity to ask questions and all were answered. The patient agreed with the plan and demonstrated an understanding of the instructions.  A copy of instructions were sent to the patient via MyChart unless otherwise noted below.    The patient was advised to call back or seek an in-person evaluation if the symptoms worsen or if the condition fails to improve as anticipated.  Time:  I spent 9 minutes with the patient via telehealth technology discussing the above problems/concerns.    Freddy Finner, NP

## 2023-02-10 ENCOUNTER — Other Ambulatory Visit: Payer: Self-pay

## 2023-02-10 ENCOUNTER — Encounter: Payer: Self-pay | Admitting: Family Medicine

## 2023-02-10 ENCOUNTER — Ambulatory Visit (INDEPENDENT_AMBULATORY_CARE_PROVIDER_SITE_OTHER): Payer: 59 | Admitting: Family Medicine

## 2023-02-10 VITALS — BP 130/84 | HR 93 | Temp 98.0°F | Ht 65.35 in | Wt 212.4 lb

## 2023-02-10 DIAGNOSIS — Z23 Encounter for immunization: Secondary | ICD-10-CM | POA: Diagnosis not present

## 2023-02-10 DIAGNOSIS — I700 Atherosclerosis of aorta: Secondary | ICD-10-CM

## 2023-02-10 DIAGNOSIS — E782 Mixed hyperlipidemia: Secondary | ICD-10-CM | POA: Diagnosis not present

## 2023-02-10 DIAGNOSIS — Z1231 Encounter for screening mammogram for malignant neoplasm of breast: Secondary | ICD-10-CM

## 2023-02-10 DIAGNOSIS — L210 Seborrhea capitis: Secondary | ICD-10-CM | POA: Diagnosis not present

## 2023-02-10 DIAGNOSIS — M4726 Other spondylosis with radiculopathy, lumbar region: Secondary | ICD-10-CM | POA: Diagnosis not present

## 2023-02-10 DIAGNOSIS — E039 Hypothyroidism, unspecified: Secondary | ICD-10-CM

## 2023-02-10 DIAGNOSIS — Z0000 Encounter for general adult medical examination without abnormal findings: Secondary | ICD-10-CM

## 2023-02-10 MED ORDER — CLOTRIMAZOLE-BETAMETHASONE 1-0.05 % EX CREA
1.0000 | TOPICAL_CREAM | Freq: Every day | CUTANEOUS | 0 refills | Status: DC
  Filled 2023-02-10: qty 30, 30d supply, fill #0

## 2023-02-10 MED ORDER — TRAMADOL HCL 50 MG PO TABS
50.0000 mg | ORAL_TABLET | Freq: Three times a day (TID) | ORAL | 0 refills | Status: AC | PRN
  Filled 2023-02-10: qty 15, 5d supply, fill #0

## 2023-02-10 MED ORDER — KETOCONAZOLE 2 % EX SHAM
1.0000 | MEDICATED_SHAMPOO | CUTANEOUS | 0 refills | Status: DC
  Filled 2023-02-10: qty 120, 84d supply, fill #0

## 2023-02-10 NOTE — Progress Notes (Signed)
BP 130/84   Pulse 93   Temp 98 F (36.7 C) (Oral)   Ht 5' 5.35" (1.66 m)   Wt 212 lb 6.4 oz (96.3 kg)   SpO2 98%   BMI 34.96 kg/m    Subjective:    Patient ID: Brandy Ballard, female    DOB: 08-11-1966, 58 y.o.   MRN: [id number]  HPI: Brandy Ballard is a 57 y.o. female presenting on 02/10/2023 for comprehensive medical examination. Current medical complaints include:  HYPERLIPIDEMIA Hyperlipidemia status: stable Satisfied with current treatment?  yes Side effects:  N/A Past cholesterol meds: none Supplements: none Aspirin:  no The 10-year ASCVD risk score (Arnett DK, et al., 2019) is: 3.6%   Values used to calculate the score:     Age: 89 years     Sex: Female     Is Non-Hispanic African American: No     Diabetic: No     Tobacco smoker: No     Systolic Blood Pressure: 130 mmHg     Is BP treated: No     HDL Cholesterol: 44 mg/dL     Total Cholesterol: 279 mg/dL Chest pain:  no Coronary artery disease:  no  HYPOTHYROIDISM Thyroid control status:stable Satisfied with current treatment? yes Medication side effects: N/A Medication compliance: not on anything Fatigue: no Cold intolerance: no Heat intolerance: no Weight gain: no Weight loss: no Constipation: no Diarrhea/loose stools: no Palpitations: no Lower extremity edema: no Anxiety/depressed mood: no  BACK PAIN Duration:  chronic Mechanism of injury: unknown Location: bilateral and low back Onset: gradual Severity: moderate Quality: aching and sore Frequency: intermittent Radiation: yes Aggravating factors: walking, lifting Alleviating factors: rest Status: fluctuating Treatments attempted: rest, ice, heat, APAP, ibuprofen, aleve, physical therapy, and HEP  Relief with NSAIDs?: mild Nighttime pain:  yes Paresthesias / decreased sensation:  yes Bowel / bladder incontinence:  no Fevers:  no Dysuria / urinary frequency:  no  Menopausal Symptoms: yes  Depression Screen done today and results listed  below:     02/10/2023    1:48 PM 05/28/2022   10:54 AM 10/22/2021    8:56 AM 09/11/2021    8:24 AM 10/10/2020    2:01 PM  Depression screen PHQ 2/9  Decreased Interest 0 0 0 0 1  Down, Depressed, Hopeless 0 0 0 0 2  PHQ - 2 Score 0 0 0 0 3  Altered sleeping 0 1 3  0  Tired, decreased energy 0 1 3  3   Change in appetite 0 0 0  2  Feeling bad or failure about yourself  0 0 0  0  Trouble concentrating 0 0 0  0  Moving slowly or fidgety/restless 0 0 0  0  Suicidal thoughts 0 0 0  0  PHQ-9 Score 0 2 6  8   Difficult doing work/chores   Somewhat difficult  Somewhat difficult    Past Medical History:  Past Medical History:  Diagnosis Date   Abdominal pain, epigastric    Achilles tendinitis, right leg    Allergy    Anemia    Arthritis    lower back   Benign neoplasm of cecum    Blood transfusion without reported diagnosis    Dental bridge present    permanent lower retainer   Gastric polyp    GERD (gastroesophageal reflux disease)    Hashimoto's disease    Heel spur    bilateral   Plantar fasciitis    bilateral   Polyp of nasal sinus  multiple   Polyp of sigmoid colon    PONV (postoperative nausea and vomiting)    nausea only   Sinus disease    Thyroid disease     Surgical History:  Past Surgical History:  Procedure Laterality Date   COLONOSCOPY WITH PROPOFOL N/A 07/19/2018   Procedure: COLONOSCOPY WITH Biopsies;  Surgeon: Midge Minium, MD;  Location: [facility];  Service: Endoscopy;  Laterality: N/A;   ESOPHAGOGASTRODUODENOSCOPY (EGD) WITH PROPOFOL N/A 11/09/2018   Procedure: ESOPHAGOGASTRODUODENOSCOPY (EGD) WITH BIOPSIES;  Surgeon: Pasty Spillers, MD;  Location: [facility];  Service: Endoscopy;  Laterality: N/A;   FRONTAL SINUS EXPLORATION Bilateral 10/05/2018   Procedure: FRONTAL SINUS EXPLORATION;  Surgeon: Linus Salmons, MD;  Location: [facility];  Service: ENT;  Laterality: Bilateral;   IMAGE GUIDED SINUS SURGERY N/A 10/05/2018    Procedure: IMAGE GUIDED SINUS SURGERY;  Surgeon: Linus Salmons, MD;  Location: [facility];  Service: ENT;  Laterality: N/A;   NASAL TURBINATE REDUCTION  10/05/2018   Procedure: TURBINATE REDUCTION/SUBMUCOSAL RESECTION;  Surgeon: Linus Salmons, MD;  Location: [facility];  Service: ENT;;   OVARIAN CYST REMOVAL     POLYPECTOMY N/A 07/19/2018   Procedure: POLYPECTOMY;  Surgeon: Midge Minium, MD;  Location: [facility];  Service: Endoscopy;  Laterality: N/A;   SEPTOPLASTY WITH ETHMOIDECTOMY, AND MAXILLARY ANTROSTOMY Bilateral 10/05/2018   Procedure: SEPTOPLASTY WITH TOTAL ETHMOIDECTOMY, AND MAXILLARY ANTROSTOMY WITH TISSUE REMOVAL;  Surgeon: Linus Salmons, MD;  Location: [facility];  Service: ENT;  Laterality: Bilateral;   SPHENOIDECTOMY  10/05/2018   Procedure: TOTAL SPHENOIDECTOMY;  Surgeon: Linus Salmons, MD;  Location: [facility];  Service: ENT;;   TOTAL ABDOMINAL HYSTERECTOMY      Medications:  Current Outpatient Medications on File Prior to Visit  Medication Sig   melatonin 5 MG TABS Take 5 mg by mouth at bedtime.   mometasone (NASONEX) 50 MCG/ACT nasal spray Place 2 sprays into the nose daily.   [DISCONTINUED] atorvastatin (LIPITOR) 20 MG tablet Take 1 tablet (20 mg total) by mouth daily.   No current facility-administered medications on file prior to visit.    Allergies:  Allergies  Allergen Reactions   Nsaids     Avoids due to Hx Peptic ulcers   Clearasil Daily Clear Acne [Benzoyl Peroxide] Rash    Social History:  Social History   Socioeconomic History   Marital status: Married    Spouse name: Not on file   Number of children: Not on file   Years of education: Not on file   Highest education level: Not on file  Occupational History   Not on file  Tobacco Use   Smoking status: Former    Packs/day: 0.25    Years: 20.00    Additional pack years: 0.00    Total pack years: 5.00    Types: Cigarettes    Quit date: 11/29/2018    Years since quitting: 4.2    Smokeless tobacco: Never   Tobacco comments:    down to 2-4 daily  Vaping Use   Vaping Use: Never used  Substance and Sexual Activity   Alcohol use: Not Currently   Drug use: Never   Sexual activity: Yes    Birth control/protection: Surgical  Other Topics Concern   Not on file  Social History Narrative   Not on file   Social Determinants of Health   Financial Resource Strain: Not on file  Food Insecurity: Not on file  Transportation Needs: Not on file  Physical Activity: Not on file  Stress: Not on file  Social Connections: Not on file  Intimate Partner Violence: Not on file   Social History   Tobacco Use  Smoking Status Former   Packs/day: 0.25   Years: 20.00   Additional pack years: 0.00   Total pack years: 5.00   Types: Cigarettes   Quit date: 11/29/2018   Years since quitting: 4.2  Smokeless Tobacco Never  Tobacco Comments   down to 2-4 daily   Social History   Substance and Sexual Activity  Alcohol Use Not Currently    Family History:  Family History  Problem Relation Age of Onset   Meniere's disease Mother    Chiari malformation Mother    Hypertension Father    Atrial fibrillation Father    CVA Father    Breast cancer Paternal Grandmother 73   Heart attack Paternal Grandfather    CVA Paternal Grandfather    ADD / ADHD Daughter    Polycystic ovary syndrome Daughter    Breast cancer Paternal Aunt        over 16    Past medical history, surgical history, medications, allergies, family history and social history reviewed with patient today and changes made to appropriate areas of the chart.   Review of Systems  Constitutional: Negative.   HENT:  Positive for congestion. Negative for ear discharge, ear pain, hearing loss, nosebleeds, sinus pain, sore throat and tinnitus.   Eyes: Negative.   Respiratory: Negative.  Negative for stridor.   Cardiovascular:  Positive for palpitations. Negative for chest pain, orthopnea, claudication, leg swelling and  PND.  Gastrointestinal:  Positive for constipation and diarrhea. Negative for abdominal pain, blood in stool, heartburn, melena, nausea and vomiting.  Genitourinary: Negative.   Musculoskeletal:  Positive for back pain, joint pain and myalgias. Negative for falls and neck pain.  Skin:  Positive for rash. Negative for itching.  Neurological:  Positive for tingling. Negative for dizziness, tremors, sensory change, speech change, focal weakness, seizures, loss of consciousness, weakness and headaches.  Endo/Heme/Allergies: Negative.   Psychiatric/Behavioral: Negative.     All other ROS negative except what is listed above and in the HPI.      Objective:    BP 130/84   Pulse 93   Temp 98 F (36.7 C) (Oral)   Ht 5' 5.35" (1.66 m)   Wt 212 lb 6.4 oz (96.3 kg)   SpO2 98%   BMI 34.96 kg/m   Wt Readings from Last 3 Encounters:  02/10/23 212 lb 6.4 oz (96.3 kg)  05/28/22 215 lb 11.2 oz (97.8 kg)  11/18/21 225 lb (102.1 kg)    Physical Exam Vitals and nursing note reviewed.  Constitutional:      General: She is not in acute distress.    Appearance: Normal appearance. She is not ill-appearing, toxic-appearing or diaphoretic.  HENT:     Head: Normocephalic and atraumatic.     Right Ear: Tympanic membrane, ear canal and external ear normal. There is no impacted cerumen.     Left Ear: Tympanic membrane, ear canal and external ear normal. There is no impacted cerumen.     Nose: Nose normal. No congestion or rhinorrhea.     Mouth/Throat:     Mouth: Mucous membranes are moist.     Pharynx: Oropharynx is clear. No oropharyngeal exudate or posterior oropharyngeal erythema.  Eyes:     General: No scleral icterus.       Right eye: No discharge.        Left eye:  No discharge.     Extraocular Movements: Extraocular movements intact.     Conjunctiva/sclera: Conjunctivae normal.     Pupils: Pupils are equal, round, and reactive to light.  Neck:     Vascular: No carotid bruit.  Cardiovascular:      Rate and Rhythm: Normal rate and regular rhythm.     Pulses: Normal pulses.     Heart sounds: No murmur heard.    No friction rub. No gallop.  Pulmonary:     Effort: Pulmonary effort is normal. No respiratory distress.     Breath sounds: Normal breath sounds. No stridor. No wheezing, rhonchi or rales.  Chest:     Chest wall: No tenderness.  Abdominal:     General: Abdomen is flat. Bowel sounds are normal. There is no distension.     Palpations: Abdomen is soft. There is no mass.     Tenderness: There is no abdominal tenderness. There is no right CVA tenderness, left CVA tenderness, guarding or rebound.     Hernia: No hernia is present.  Genitourinary:    Comments: Breast and pelvic exams deferred with shared decision making Musculoskeletal:        General: No swelling, tenderness, deformity or signs of injury.     Cervical back: Normal range of motion and neck supple. No rigidity. No muscular tenderness.     Right lower leg: No edema.     Left lower leg: No edema.  Lymphadenopathy:     Cervical: No cervical adenopathy.  Skin:    General: Skin is warm and dry.     Capillary Refill: Capillary refill takes less than 2 seconds.     Coloration: Skin is not jaundiced or pale.     Findings: No bruising, erythema, lesion or rash.  Neurological:     General: No focal deficit present.     Mental Status: She is alert and oriented to person, place, and time. Mental status is at baseline.     Cranial Nerves: No cranial nerve deficit.     Sensory: No sensory deficit.     Motor: No weakness.     Coordination: Coordination normal.     Gait: Gait normal.     Deep Tendon Reflexes: Reflexes normal.  Psychiatric:        Mood and Affect: Mood normal.        Behavior: Behavior normal.        Thought Content: Thought content normal.        Judgment: Judgment normal.     Results for orders placed or performed during the hospital encounter of 11/18/21  Urine Culture   Specimen: Urine,  Clean Catch  Result Value Ref Range   Specimen Description      URINE, CLEAN CATCH Performed at [facility], [address], [place], [place] [address]    Special Requests      NONE Performed at [facility], [address] [place], [place] [address]    Culture MULTIPLE SPECIES PRESENT, SUGGEST RECOLLECTION (A)    Report Status 11/19/2021 FINAL   Comprehensive metabolic panel  Result Value Ref Range   Sodium 135 135 - 145 mmol/L   Potassium 4.0 3.5 - 5.1 mmol/L   Chloride 101 98 - 111 mmol/L   CO2 26 22 - 32 mmol/L   Glucose, Bld 112 (H) 70 - 99 mg/dL   BUN 21 (H) 6 - 20 mg/dL   Creatinine, Ser 0.98 0.44 - 1.00 mg/dL   Calcium 9.4  8.9 - 10.3 mg/dL   Total Protein 8.1 6.5 - 8.1 g/dL   Albumin 4.0 3.5 - 5.0 g/dL   AST 15 15 - 41 U/L   ALT 22 0 - 44 U/L   Alkaline Phosphatase 69 38 - 126 U/L   Total Bilirubin 0.6 0.3 - 1.2 mg/dL   GFR, Estimated >16 >10 mL/min   Anion gap 8 5 - 15  CBC  Result Value Ref Range   WBC 10.3 4.0 - 10.5 K/uL   RBC 4.39 3.87 - 5.11 MIL/uL   Hemoglobin 13.8 12.0 - 15.0 g/dL   HCT 96.0 45.4 - 09.8 %   MCV 96.8 80.0 - 100.0 fL   MCH 31.4 26.0 - 34.0 pg   MCHC 32.5 30.0 - 36.0 g/dL   RDW 11.9 14.7 - 82.9 %   Platelets 325 150 - 400 K/uL   nRBC 0.0 0.0 - 0.2 %  Urinalysis, Routine w reflex microscopic Urine, Unspecified Source  Result Value Ref Range   Color, Urine YELLOW (A) YELLOW   APPearance HAZY (A) CLEAR   Specific Gravity, Urine 1.028 1.005 - 1.030   pH 5.0 5.0 - 8.0   Glucose, UA NEGATIVE NEGATIVE mg/dL   Hgb urine dipstick SMALL (A) NEGATIVE   Bilirubin Urine NEGATIVE NEGATIVE   Ketones, ur NEGATIVE NEGATIVE mg/dL   Protein, ur NEGATIVE NEGATIVE mg/dL   Nitrite NEGATIVE NEGATIVE   Leukocytes,Ua TRACE (A) NEGATIVE   RBC / HPF 0-5 0 - 5 RBC/hpf   WBC, UA 6-10 0 - 5 WBC/hpf   Bacteria, UA RARE (A) NONE SEEN   Squamous Epithelial / HPF 0-5 0 - 5   Mucus PRESENT   Lipase, blood  Result Value Ref Range   Lipase  52 (H) 11 - 51 U/L      Assessment & Plan:   Problem List Items Addressed This Visit       Cardiovascular and Mediastinum   Aortic atherosclerosis (HCC)    Will keep BP and cholesterol under good control. Continue to monitor. Call with anyc ocnerns.         Endocrine   Hypothyroidism    Not currently on any medicine. Will check her levels today. Await results. Treat as needed.         Nervous and Auditory   Osteoarthritis of spine with radiculopathy, lumbar region    Does not want to do surgery. Will send in short supply of tramadol to be used as needed for bad days.       Relevant Medications   traMADol (ULTRAM) 50 MG tablet     Other   Hyperlipidemia    Rechecking labs today. Await results treat as needed.       Other Visit Diagnoses     Routine general medical examination at a health care facility    -  Primary   Vaccines up to date. Screening labs checked today. Pap N/A. Mammo ordered. Colonoscopy up to date. Continue diet and exercise. Call with any concerns.   Relevant Orders   CBC with Differential/Platelet   Comprehensive metabolic panel   Lipid Panel w/o Chol/HDL Ratio   Urinalysis, Routine w reflex microscopic   TSH   Bayer DCA Hb A1c Waived   Seborrhea capitis in adult       Refills given today.   Relevant Medications   ketoconazole (NIZORAL) 2 % shampoo   Encounter for screening mammogram for malignant neoplasm of breast       Mammogram ordered today.  Relevant Orders   MM 3D SCREENING MAMMOGRAM BILATERAL BREAST        Follow up plan: Return in about 1 year (around 02/10/2024) for physical.   LABORATORY TESTING:  - Pap smear: not applicable  IMMUNIZATIONS:   - Tdap: Tetanus vaccination status reviewed: last tetanus booster within 10 years. - Influenza: Up to date - Pneumovax: Not applicable - Prevnar: Not applicable - COVID: Up to date - HPV: Not applicable - Shingrix vaccine: Up to date  SCREENING: -Mammogram: Ordered today  -  Colonoscopy: Up to date   PATIENT COUNSELING:   Advised to take 1 mg of folate supplement per day if capable of pregnancy.   Sexuality: Discussed sexually transmitted diseases, partner selection, use of condoms, avoidance of unintended pregnancy  and contraceptive alternatives.   Advised to avoid cigarette smoking.  I discussed with the patient that most people either abstain from alcohol or drink within safe limits (<=14/week and <=4 drinks/occasion for males, <=7/weeks and <= 3 drinks/occasion for females) and that the risk for alcohol disorders and other health effects rises proportionally with the number of drinks per week and how often a drinker exceeds daily limits.  Discussed cessation/primary prevention of drug use and availability of treatment for abuse.   Diet: Encouraged to adjust caloric intake to maintain  or achieve ideal body weight, to reduce intake of dietary saturated fat and total fat, to limit sodium intake by avoiding high sodium foods and not adding table salt, and to maintain adequate dietary potassium and calcium preferably from fresh fruits, vegetables, and low-fat dairy products.    stressed the importance of regular exercise  Injury prevention: Discussed safety belts, safety helmets, smoke detector, smoking near bedding or upholstery.   Dental health: Discussed importance of regular tooth brushing, flossing, and dental visits.    NEXT PREVENTATIVE PHYSICAL DUE IN 1 YEAR. Return in about 1 year (around 02/10/2024) for physical.

## 2023-02-10 NOTE — Patient Instructions (Signed)
Your mammogram is scheduled for 03/04/23 at 8:20 AM at [facility] [place] in [place].   Address: [address], [place], [place] [address] Phone: [phone]

## 2023-02-15 NOTE — Assessment & Plan Note (Signed)
Not currently on any medicine. Will check her levels today. Await results. Treat as needed.

## 2023-02-15 NOTE — Assessment & Plan Note (Signed)
Will keep BP and cholesterol under good control. Continue to monitor. Call with anyc ocnerns.

## 2023-02-15 NOTE — Assessment & Plan Note (Signed)
Rechecking labs today. Await results- treat as needed.  

## 2023-02-15 NOTE — Assessment & Plan Note (Signed)
Does not want to do surgery. Will send in short supply of tramadol to be used as needed for bad days.

## 2023-02-17 ENCOUNTER — Other Ambulatory Visit: Payer: Self-pay

## 2023-03-04 ENCOUNTER — Ambulatory Visit
Admission: RE | Admit: 2023-03-04 | Discharge: 2023-03-04 | Disposition: A | Discharge status: Home / Self Care | Payer: 59 | Source: Ambulatory Visit | Attending: Family Medicine | Admitting: Family Medicine

## 2023-03-04 DIAGNOSIS — Z1231 Encounter for screening mammogram for malignant neoplasm of breast: Secondary | ICD-10-CM | POA: Diagnosis not present

## 2023-03-05 NOTE — Progress Notes (Signed)
Contacted via MyChart   Normal mammogram, may repeat in one year:)

## 2023-04-08 DIAGNOSIS — H2513 Age-related nuclear cataract, bilateral: Secondary | ICD-10-CM | POA: Diagnosis not present

## 2023-04-08 DIAGNOSIS — H40053 Ocular hypertension, bilateral: Secondary | ICD-10-CM | POA: Diagnosis not present

## 2023-04-08 DIAGNOSIS — G43109 Migraine with aura, not intractable, without status migrainosus: Secondary | ICD-10-CM | POA: Diagnosis not present

## 2023-06-26 ENCOUNTER — Emergency Department
Admission: EM | Admit: 2023-06-26 | Discharge: 2023-06-27 | Disposition: A | Discharge status: Home / Self Care | Payer: 59 | Attending: Emergency Medicine | Admitting: Emergency Medicine

## 2023-06-26 ENCOUNTER — Other Ambulatory Visit: Payer: Self-pay

## 2023-06-26 DIAGNOSIS — R197 Diarrhea, unspecified: Secondary | ICD-10-CM | POA: Diagnosis not present

## 2023-06-26 DIAGNOSIS — K5792 Diverticulitis of intestine, part unspecified, without perforation or abscess without bleeding: Secondary | ICD-10-CM | POA: Diagnosis not present

## 2023-06-26 DIAGNOSIS — Z85038 Personal history of other malignant neoplasm of large intestine: Secondary | ICD-10-CM | POA: Diagnosis not present

## 2023-06-26 DIAGNOSIS — R1032 Left lower quadrant pain: Secondary | ICD-10-CM | POA: Diagnosis not present

## 2023-06-26 DIAGNOSIS — K5730 Diverticulosis of large intestine without perforation or abscess without bleeding: Secondary | ICD-10-CM | POA: Diagnosis not present

## 2023-06-26 LAB — CBC
HCT: 45.8 % (ref 36.0–46.0)
Hemoglobin: 15.5 g/dL — ABNORMAL HIGH (ref 12.0–15.0)
MCH: 31.4 pg (ref 26.0–34.0)
MCHC: 33.8 g/dL (ref 30.0–36.0)
MCV: 92.7 fL (ref 80.0–100.0)
Platelets: 353 10*3/uL (ref 150–400)
RBC: 4.94 MIL/uL (ref 3.87–5.11)
RDW: 12.2 % (ref 11.5–15.5)
WBC: 10 10*3/uL (ref 4.0–10.5)
nRBC: 0 % (ref 0.0–0.2)

## 2023-06-26 LAB — COMPREHENSIVE METABOLIC PANEL
ALT: 27 U/L (ref 0–44)
AST: 18 U/L (ref 15–41)
Albumin: 3.9 g/dL (ref 3.5–5.0)
Alkaline Phosphatase: 51 U/L (ref 38–126)
Anion gap: 13 (ref 5–15)
BUN: 15 mg/dL (ref 6–20)
CO2: 22 mmol/L (ref 22–32)
Calcium: 9.4 mg/dL (ref 8.9–10.3)
Chloride: 103 mmol/L (ref 98–111)
Creatinine, Ser: 0.57 mg/dL (ref 0.44–1.00)
GFR, Estimated: >60 mL/min (ref >60)
Glucose, Bld: 109 mg/dL — ABNORMAL HIGH (ref 70–99)
Potassium: 3.9 mmol/L (ref 3.5–5.1)
Sodium: 138 mmol/L (ref 135–145)
Total Bilirubin: 0.6 mg/dL (ref 0.3–1.2)
Total Protein: 7.7 g/dL (ref 6.5–8.1)

## 2023-06-26 LAB — LIPASE, BLOOD: Lipase: 34 U/L (ref 11–51)

## 2023-06-26 MED ORDER — SODIUM CHLORIDE 0.9 % IV BOLUS (SEPSIS)
1000.0000 mL | Freq: Once | INTRAVENOUS | Status: AC
  Administered 2023-06-27: 1000 mL via INTRAVENOUS

## 2023-06-26 MED ORDER — ONDANSETRON 4 MG PO TBDP
4.0000 mg | ORAL_TABLET | Freq: Once | ORAL | Status: AC | PRN
  Administered 2023-06-26: 4 mg via ORAL
  Filled 2023-06-26: qty 1

## 2023-06-26 MED ORDER — MORPHINE SULFATE (PF) 4 MG/ML IV SOLN
4.0000 mg | Freq: Once | INTRAVENOUS | Status: AC
  Administered 2023-06-27: 4 mg via INTRAVENOUS
  Filled 2023-06-26: qty 1

## 2023-06-26 NOTE — ED Triage Notes (Signed)
Pt to ed from upstairs on 2C. Pt was brought down for abd pain that started Thursday. Pt has HX of diverticulitis. Pt advised she she is on a specific diet and a med to control it. She was eating grapes, and think that is what caused her flair up. Pt is caox4, in no acute distress and in wheel chair in triage.

## 2023-06-26 NOTE — ED Notes (Signed)
This RN attempted IV. Unsuccessful. Lab called,

## 2023-06-26 NOTE — ED Provider Notes (Signed)
[facility] Provider Note    Event Date/Time   First MD Initiated Contact with Patient 06/26/23 2333     (approximate)   History   Abdominal Pain (LLQ x 1 day)   HPI  Brandy Ballard is a 57 y.o. female with previous history of ovarian cysts, diverticulitis who presents to the emergency department left lower quadrant abdominal pain for the 2 days with diarrhea.  No bloody stools or melena.  No dysuria, hematuria, vaginal bleeding or discharge.  No fevers.  No vomiting.  Is concerned this may be diverticulitis.  Has had previous ovarian cystectomy, hysterectomy.   History provided by patient, family.    Past Medical History:  Diagnosis Date   Abdominal pain, epigastric    Achilles tendinitis, right leg    Allergy    Anemia    Arthritis    lower back   Benign neoplasm of cecum    Blood transfusion without reported diagnosis    Dental bridge present    permanent lower retainer   Gastric polyp    GERD (gastroesophageal reflux disease)    Hashimoto's disease    Heel spur    bilateral   Plantar fasciitis    bilateral   Polyp of nasal sinus    multiple   Polyp of sigmoid colon    PONV (postoperative nausea and vomiting)    nausea only   Sinus disease    Thyroid disease     Past Surgical History:  Procedure Laterality Date   COLONOSCOPY WITH PROPOFOL N/A 07/19/2018   Procedure: COLONOSCOPY WITH Biopsies;  Surgeon: Midge Minium, MD;  Location: [facility];  Service: Endoscopy;  Laterality: N/A;   ESOPHAGOGASTRODUODENOSCOPY (EGD) WITH PROPOFOL N/A 11/09/2018   Procedure: ESOPHAGOGASTRODUODENOSCOPY (EGD) WITH BIOPSIES;  Surgeon: Pasty Spillers, MD;  Location: [facility];  Service: Endoscopy;  Laterality: N/A;   FRONTAL SINUS EXPLORATION Bilateral 10/05/2018   Procedure: FRONTAL SINUS EXPLORATION;  Surgeon: Linus Salmons, MD;  Location: [facility];  Service: ENT;  Laterality: Bilateral;   IMAGE GUIDED SINUS SURGERY N/A  10/05/2018   Procedure: IMAGE GUIDED SINUS SURGERY;  Surgeon: Linus Salmons, MD;  Location: [facility];  Service: ENT;  Laterality: N/A;   NASAL TURBINATE REDUCTION  10/05/2018   Procedure: TURBINATE REDUCTION/SUBMUCOSAL RESECTION;  Surgeon: Linus Salmons, MD;  Location: [facility];  Service: ENT;;   OVARIAN CYST REMOVAL     POLYPECTOMY N/A 07/19/2018   Procedure: POLYPECTOMY;  Surgeon: Midge Minium, MD;  Location: [facility];  Service: Endoscopy;  Laterality: N/A;   SEPTOPLASTY WITH ETHMOIDECTOMY, AND MAXILLARY ANTROSTOMY Bilateral 10/05/2018   Procedure: SEPTOPLASTY WITH TOTAL ETHMOIDECTOMY, AND MAXILLARY ANTROSTOMY WITH TISSUE REMOVAL;  Surgeon: Linus Salmons, MD;  Location: [facility];  Service: ENT;  Laterality: Bilateral;   SPHENOIDECTOMY  10/05/2018   Procedure: TOTAL SPHENOIDECTOMY;  Surgeon: Linus Salmons, MD;  Location: [facility];  Service: ENT;;   TOTAL ABDOMINAL HYSTERECTOMY      MEDICATIONS:  Prior to Admission medications   Medication Sig Start Date End Date Taking? Authorizing Provider  clotrimazole-betamethasone (LOTRISONE) cream Apply 1 Application topically daily. 02/10/23   Johnson, Megan P, DO  ketoconazole (NIZORAL) 2 % shampoo Apply 1 Application topically 2 (two) times a week. 02/12/23   Johnson, Megan P, DO  melatonin 5 MG TABS Take 5 mg by mouth at bedtime.    [provider]  mometasone (NASONEX) 50 MCG/ACT nasal spray Place 2 sprays into the nose daily. 09/11/21   Dorcas Carrow,  DO  atorvastatin (LIPITOR) 20 MG tablet Take 1 tablet (20 mg total) by mouth daily. 08/19/18 05/29/19  Trey Sailors, PA-C    Physical Exam   Triage Vital Signs: ED Triage Vitals  Encounter Vitals Group     BP 06/26/23 2122 118/79     Systolic BP Percentile --      Diastolic BP Percentile --      Pulse Rate 06/26/23 2122 85     Resp 06/26/23 2122 16     Temp 06/26/23 2122 97.9 F (36.6 C)     Temp Source 06/26/23 2122 Oral     SpO2 06/26/23 2122 99 %      Weight 06/26/23 2123 204 lb (92.5 kg)     Height 06/26/23 2123 5\' 6"  (1.676 m)     Head Circumference --      Peak Flow --      Pain Score 06/26/23 2123 7     Pain Loc --      Pain Education --      Exclude from Growth Chart --     Most recent vital signs: Vitals:   06/26/23 2122  BP: 118/79  Pulse: 85  Resp: 16  Temp: 97.9 F (36.6 C)  SpO2: 99%    CONSTITUTIONAL: Alert, responds appropriately to questions.  Grimaces when laying the bed flat HEAD: Normocephalic, atraumatic EYES: Conjunctivae clear, pupils appear equal, sclera nonicteric ENT: normal nose; moist mucous membranes CARD: RRR; S1 and S2 appreciated RESP: Normal chest excursion without splinting or tachypnea; breath sounds clear and equal bilaterally; no wheezes, no rhonchi, no rales, no hypoxia or respiratory distress, speaking full sentences ABD/GI: Non-distended; soft, tender to palpation in the left lower quadrant without guarding or rebound SKIN: Normal color for age and race; warm; no rash on exposed skin NEURO: Moves all extremities equally, normal speech PSYCH: The patient's mood and manner are appropriate.   ED Results / Procedures / Treatments   LABS: (all labs ordered are listed, but only abnormal results are displayed) Labs Reviewed  COMPREHENSIVE METABOLIC PANEL - Abnormal; Notable for the following components:      Result Value   Glucose, Bld 109 (*)    All other components within normal limits  CBC - Abnormal; Notable for the following components:   Hemoglobin 15.5 (*)    All other components within normal limits  URINALYSIS, ROUTINE W REFLEX MICROSCOPIC - Abnormal; Notable for the following components:   Color, Urine YELLOW (*)    APPearance CLEAR (*)    Specific Gravity, Urine 1.044 (*)    Leukocytes,Ua SMALL (*)    Bacteria, UA RARE (*)    All other components within normal limits  LIPASE, BLOOD     EKG:   RADIOLOGY: My personal review and interpretation of imaging: CT of  the abdomen pelvis shows no acute abnormality.  I have personally reviewed all radiology reports.   CT ABDOMEN PELVIS W CONTRAST  Result Date: 06/27/2023 CLINICAL DATA:  Left lower quadrant pain EXAM: CT ABDOMEN AND PELVIS WITH CONTRAST TECHNIQUE: Multidetector CT imaging of the abdomen and pelvis was performed using the standard protocol following bolus administration of intravenous contrast. RADIATION DOSE REDUCTION: This exam was performed according to the departmental dose-optimization program which includes automated exposure control, adjustment of the mA and/or kV according to patient size and/or use of iterative reconstruction technique. CONTRAST:  OMNIPAQUE IOHEXOL 350 MG/ML SOLN COMPARISON:  05/27/2022 FINDINGS: Lower chest: No acute abnormality Hepatobiliary: Stable cyst adjacent to the  falciform ligament. No suspicious hepatic abnormality. Gallbladder unremarkable. Pancreas: No focal abnormality or ductal dilatation. Spleen: No focal abnormality.  Normal size. Adrenals/Urinary Tract: No adrenal abnormality. No focal renal abnormality. No stones or hydronephrosis. Urinary bladder is unremarkable. Stomach/Bowel: Stomach, large and small bowel grossly unremarkable. Few scattered sigmoid diverticula. Vascular/Lymphatic: No evidence of aneurysm or adenopathy. Reproductive: Prior hysterectomy.  No adnexal masses. Other: No free fluid or free air. Musculoskeletal: No acute bony abnormality. IMPRESSION: No acute findings in the abdomen or pelvis. Electronically Signed   By: Charlett Nose M.D.   On: 06/27/2023 00:45     PROCEDURES:  Critical Care performed: No   CRITICAL CARE Performed by: Baxter Hire Marylee Belzer   Total critical care time: 0 minutes  Critical care time was exclusive of separately billable procedures and treating other patients.  Critical care was necessary to treat or prevent imminent or life-threatening deterioration.  Critical care was time spent personally by me on the  following activities: development of treatment plan with patient and/or surrogate as well as nursing, discussions with consultants, evaluation of patient's response to treatment, examination of patient, obtaining history from patient or surrogate, ordering and performing treatments and interventions, ordering and review of laboratory studies, ordering and review of radiographic studies, pulse oximetry and re-evaluation of patient's condition.   Procedures    IMPRESSION / MDM / ASSESSMENT AND PLAN / ED COURSE  I reviewed the triage vital signs and the nursing notes.    Patient here with diarrhea and left lower quadrant abdominal pain.    DIFFERENTIAL DIAGNOSIS (includes but not limited to):   Diverticulitis, colitis, ovarian cyst, UTI, kidney stone, pyelonephritis   Patient's presentation is most consistent with acute presentation with potential threat to life or bodily function.   PLAN: Will obtain labs, urine, CT of the abdomen pelvis.  Will give IV fluids, pain and nausea medicine.   MEDICATIONS GIVEN IN ED: Medications  ondansetron (ZOFRAN-ODT) disintegrating tablet 4 mg (4 mg Oral Given 06/26/23 2134)  sodium chloride 0.9 % bolus 1,000 mL (1,000 mLs Intravenous New Bag/Given 06/27/23 0006)  morphine (PF) 4 MG/ML injection 4 mg (4 mg Intravenous Given 06/27/23 0007)  iohexol (OMNIPAQUE) 350 MG/ML injection 100 mL (100 mLs Intravenous Contrast Given 06/27/23 0014)  morphine (PF) 4 MG/ML injection 4 mg (4 mg Intravenous Given 06/27/23 0130)     ED COURSE: Patient's labs show no leukocytosis, normal creatinine, LFTs and lipase.  Urine shows no sign of infection or blood.  CT scan reviewed and interpreted by myself and the radiologist and shows diverticulosis without diverticulitis and no other acute abnormality.  We discussed that this could be the beginnings of diverticulitis and have discussed sending her home with antibiotics that she can start in the next 2 to 3 days if symptoms or  not improving.  She states this feels very similar to her previous episode of diverticulitis and she is comfortable with this plan.  Will discharge with pain and nausea medicine and a work note.  Recommended soft bland diet for the next several days.  Discussed return precautions.  Patient comfortable with this plan.   At this time, I do not feel there is any life-threatening condition present. I reviewed all nursing notes, vitals, pertinent previous records.  All lab and urine results, EKGs, imaging ordered have been independently reviewed and interpreted by myself.  I reviewed all available radiology reports from any imaging ordered this visit.  Based on my assessment, I feel the patient is safe to be  discharged home without further emergent workup and can continue workup as an outpatient as needed. Discussed all findings, treatment plan as well as usual and customary return precautions.  They verbalize understanding and are comfortable with this plan.  Outpatient follow-up has been provided as needed.  All questions have been answered.    CONSULTS:  none   OUTSIDE RECORDS REVIEWED: Reviewed last family medicine note on 02/10/2023.       FINAL CLINICAL IMPRESSION(S) / ED DIAGNOSES   Final diagnoses:  LLQ abdominal pain     Rx / DC Orders   ED Discharge Orders          Ordered    amoxicillin-clavulanate (AUGMENTIN) 875-125 MG tablet  2 times daily        06/27/23 0122    oxyCODONE-acetaminophen (PERCOCET) 5-325 MG tablet  Every 8 hours PRN        06/27/23 0122    metoCLOPramide (REGLAN) 10 MG tablet  Every 8 hours PRN        06/27/23 0122             Note:  This document was prepared using Dragon voice recognition software and may include unintentional dictation errors.   Jamecia Lerman, Layla Maw, DO 06/27/23 [phone]

## 2023-06-27 ENCOUNTER — Emergency Department: Payer: 59

## 2023-06-27 DIAGNOSIS — R1032 Left lower quadrant pain: Secondary | ICD-10-CM | POA: Diagnosis not present

## 2023-06-27 DIAGNOSIS — K5730 Diverticulosis of large intestine without perforation or abscess without bleeding: Secondary | ICD-10-CM | POA: Diagnosis not present

## 2023-06-27 LAB — URINALYSIS, ROUTINE W REFLEX MICROSCOPIC
Bilirubin Urine: NEGATIVE
Glucose, UA: NEGATIVE mg/dL
Hgb urine dipstick: NEGATIVE
Ketones, ur: NEGATIVE mg/dL
Nitrite: NEGATIVE
Protein, ur: NEGATIVE mg/dL
Specific Gravity, Urine: 1.044 — ABNORMAL HIGH (ref 1.005–1.030)
pH: 5 (ref 5.0–8.0)

## 2023-06-27 MED ORDER — OXYCODONE-ACETAMINOPHEN 5-325 MG PO TABS
2.0000 | ORAL_TABLET | Freq: Three times a day (TID) | ORAL | 0 refills | Status: DC | PRN
  Filled 2023-06-27: qty 20, 4d supply, fill #0

## 2023-06-27 MED ORDER — IOHEXOL 350 MG/ML SOLN
100.0000 mL | Freq: Once | INTRAVENOUS | Status: AC | PRN
Start: 2023-06-27 — End: 2023-06-27
  Administered 2023-06-27: 100 mL via INTRAVENOUS

## 2023-06-27 MED ORDER — METOCLOPRAMIDE HCL 10 MG PO TABS
10.0000 mg | ORAL_TABLET | Freq: Three times a day (TID) | ORAL | 0 refills | Status: DC | PRN
Start: 2023-06-27 — End: 2023-10-21
  Filled 2023-06-27: qty 30, 10d supply, fill #0

## 2023-06-27 MED ORDER — AMOXICILLIN-POT CLAVULANATE 875-125 MG PO TABS
1.0000 | ORAL_TABLET | Freq: Two times a day (BID) | ORAL | 0 refills | Status: DC
  Filled 2023-06-27: qty 14, 7d supply, fill #0

## 2023-06-27 MED ORDER — MORPHINE SULFATE (PF) 4 MG/ML IV SOLN
4.0000 mg | Freq: Once | INTRAVENOUS | Status: AC
  Administered 2023-06-27: 4 mg via INTRAVENOUS
  Filled 2023-06-27: qty 1

## 2023-06-27 NOTE — Discharge Instructions (Addendum)
Your urine and CT scan were reassuring however I suspect that you have early diverticulitis.  Please take your antibiotics as prescribed.  You may use the pain and nausea medicine as needed.   You are being provided a prescription for opiates (also known as narcotics) for pain control.  Opiates can be addictive and should only be used when absolutely necessary for pain control when other alternatives do not work.  We recommend you only use them for the recommended amount of time and only as prescribed.  Please do not take with other sedative medications or alcohol.  Please do not drive, operate machinery, make important decisions while taking opiates.  Please note that these medications can be addictive and have high abuse potential.  Patients can become addicted to narcotics after only taking them for a few days.  Please keep these medications locked away from children, teenagers or any family members with history of substance abuse.  Narcotic pain medicine may also make you constipated.  You may use over-the-counter medications such as MiraLAX, Colace to prevent constipation.  If you become constipated, you may use over-the-counter enemas as needed.  Itching and nausea are also common side effects of narcotic pain medication.  If you develop uncontrolled vomiting or a rash, please stop these medications and seek medical care.

## 2023-06-28 ENCOUNTER — Other Ambulatory Visit: Payer: Self-pay

## 2023-10-21 ENCOUNTER — Telehealth: Payer: 59 | Admitting: Nurse Practitioner

## 2023-10-21 ENCOUNTER — Other Ambulatory Visit: Payer: Self-pay

## 2023-10-21 DIAGNOSIS — R6889 Other general symptoms and signs: Secondary | ICD-10-CM

## 2023-10-21 MED ORDER — OSELTAMIVIR PHOSPHATE 75 MG PO CAPS
75.0000 mg | ORAL_CAPSULE | Freq: Two times a day (BID) | ORAL | 0 refills | Status: AC
  Filled 2023-10-21: qty 10, 5d supply, fill #0

## 2023-10-21 MED ORDER — METOCLOPRAMIDE HCL 10 MG PO TABS
10.0000 mg | ORAL_TABLET | Freq: Three times a day (TID) | ORAL | 0 refills | Status: DC | PRN
  Filled 2023-10-21: qty 60, 20d supply, fill #0

## 2023-10-21 NOTE — Progress Notes (Signed)
Virtual Visit Consent   Brandy Ballard, you are scheduled for a virtual visit with a [facility] provider today. Just as with appointments in the office, your consent must be obtained to participate. Your consent will be active for this visit and any virtual visit you may have with one of our providers in the next 365 days. If you have a MyChart account, a copy of this consent can be sent to you electronically.  As this is a virtual visit, video technology does not allow for your provider to perform a traditional examination. This may limit your provider's ability to fully assess your condition. If your provider identifies any concerns that need to be evaluated in person or the need to arrange testing (such as labs, EKG, etc.), we will make arrangements to do so. Although advances in technology are sophisticated, we cannot ensure that it will always work on either your end or our end. If the connection with a video visit is poor, the visit may have to be switched to a telephone visit. With either a video or telephone visit, we are not always able to ensure that we have a secure connection.  By engaging in this virtual visit, you consent to the provision of healthcare and authorize for your insurance to be billed (if applicable) for the services provided during this visit. Depending on your insurance coverage, you may receive a charge related to this service.  I need to obtain your verbal consent now. Are you willing to proceed with your visit today? Brandy Ballard has provided verbal consent on 10/21/2023 for a virtual visit (video or telephone). Brandy Simas, FNP  Date: 10/21/2023 5:19 PM  Virtual Visit via Video Note   I, Brandy Ballard, connected with  Brandy Ballard  ([id number], 1966-06-03) on 10/21/23 at  5:30 PM EST by a video-enabled telemedicine application and verified that I am speaking with the correct person using two identifiers.  Location: Patient: Virtual Visit Location Patient: Home Provider:  Virtual Visit Location Provider: Home Office   I discussed the limitations of evaluation and management by telemedicine and the availability of in person appointments. The patient expressed understanding and agreed to proceed.    History of Present Illness: Brandy Ballard is a 58 y.o. who identifies as a female who was assigned female at birth, and is being seen today for chills/ body aches.  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    Problems:  Patient Active Problem List   Diagnosis Date Noted   Aortic atherosclerosis (HCC) 05/28/2022   Fatty liver 05/28/2022   Diverticulosis 05/28/2022   Hyperlipidemia 09/17/2021   Migraine without status migrainosus, not intractable 09/12/2021   Sinus disease 09/12/2021   History of Hashimoto thyroiditis 09/11/2021   Osteoarthritis of spine with radiculopathy, lumbar region 09/11/2021   Spinal stenosis of lumbar region 09/11/2021   Gastroesophageal reflux disease    Tobacco abuse 08/18/2018   Osteoarthritis 05/17/2013   Esophageal reflux 05/17/2013   Hypothyroidism 05/17/2013    Allergies:  Allergies  Allergen Reactions   Nsaids     Avoids due to Hx Peptic ulcers   Clearasil Daily Clear Acne [Benzoyl Peroxide] Rash   Medications:  Current Outpatient Medications:    amoxicillin-clavulanate (AUGMENTIN) 875-125 MG tablet, Take 1 tablet by mouth 2 (two) times daily., Disp: 14 tablet, Rfl: 0   clotrimazole-betamethasone (LOTRISONE) cream, Apply 1 Application topically  daily., Disp: 30 g, Rfl: 0   ketoconazole (NIZORAL) 2 % shampoo, Apply 1 Application topically 2 (two) times a week., Disp: 120 mL, Rfl: 0   melatonin 5 MG TABS, Take 5 mg by mouth at bedtime., Disp: , Rfl:    metoCLOPramide (REGLAN) 10 MG tablet, Take 1 tablet (10 mg total) by mouth every 8 (eight) hours as needed for nausea., Disp: 30 tablet, Rfl: 0    mometasone (NASONEX) 50 MCG/ACT nasal spray, Place 2 sprays into the nose daily., Disp: 17 g, Rfl: 12   oxyCODONE-acetaminophen (PERCOCET) 5-325 MG tablet, Take 2 tablets by mouth every 8 (eight) hours as needed for severe pain., Disp: 20 tablet, Rfl: 0  Observations/Objective: Patient is well-developed, well-nourished in no acute distress.  Resting comfortably  at home.  Head is normocephalic, atraumatic.  No labored breathing.  Speech is clear and coherent with logical content.  Patient is alert and oriented at baseline.    Assessment and Plan:  1. Flu-like symptoms (Primary)  - metoCLOPramide (REGLAN) 10 MG tablet; Take 1 tablet (10 mg total) by mouth every 8 (eight) hours as needed for nausea.  Dispense: 60 tablet; Refill: 0 - oseltamivir (TAMIFLU) 75 MG capsule; Take 1 capsule (75 mg total) by mouth 2 (two) times daily for 5 days.  Dispense: 10 capsule; Refill: 0     Follow Up Instructions: I discussed the assessment and treatment plan with the patient. The patient was provided an opportunity to ask questions and all were answered. The patient agreed with the plan and demonstrated an understanding of the instructions.  A copy of instructions were sent to the patient via MyChart unless otherwise noted below.    The patient was advised to call back or seek an in-person evaluation if the symptoms worsen or if the condition fails to improve as anticipated.    Brandy Simas, FNP

## 2024-02-17 ENCOUNTER — Ambulatory Visit: Payer: Self-pay | Admitting: Family Medicine

## 2024-02-17 ENCOUNTER — Encounter: Payer: Self-pay | Admitting: Family Medicine

## 2024-02-17 VITALS — BP 114/68 | HR 93 | Ht 65.5 in | Wt 205.6 lb

## 2024-02-17 DIAGNOSIS — Z1231 Encounter for screening mammogram for malignant neoplasm of breast: Secondary | ICD-10-CM

## 2024-02-17 DIAGNOSIS — H6123 Impacted cerumen, bilateral: Secondary | ICD-10-CM

## 2024-02-17 DIAGNOSIS — I700 Atherosclerosis of aorta: Secondary | ICD-10-CM | POA: Diagnosis not present

## 2024-02-17 DIAGNOSIS — E039 Hypothyroidism, unspecified: Secondary | ICD-10-CM | POA: Diagnosis not present

## 2024-02-17 DIAGNOSIS — E782 Mixed hyperlipidemia: Secondary | ICD-10-CM

## 2024-02-17 DIAGNOSIS — Z0000 Encounter for general adult medical examination without abnormal findings: Secondary | ICD-10-CM | POA: Diagnosis not present

## 2024-02-17 DIAGNOSIS — Z1211 Encounter for screening for malignant neoplasm of colon: Secondary | ICD-10-CM

## 2024-02-17 DIAGNOSIS — E010 Iodine-deficiency related diffuse (endemic) goiter: Secondary | ICD-10-CM

## 2024-02-17 NOTE — Assessment & Plan Note (Signed)
 Will keep BP and cholesterol under good control. Continue to monitor. Call with any concerns.

## 2024-02-17 NOTE — Assessment & Plan Note (Signed)
 Rechecking labs today. Await results. Treat as needed.

## 2024-02-17 NOTE — Patient Instructions (Signed)
 Please call to schedule your mammogram and/or bone density: [facility]  Address: [address], [place], [place] [address] Phone: [phone]  [facility] [address] [place],  [place]  [address] Phone: [phone]

## 2024-02-17 NOTE — Progress Notes (Signed)
 BP 114/68 (BP Location: Left Arm, Patient Position: Sitting, Cuff Size: Large)   Pulse 93   Ht 5' 5.5" (1.664 m)   Wt 205 lb 9.6 oz (93.3 kg)   SpO2 97%   BMI 33.69 kg/m    Subjective:    Patient ID: Brandy Ballard, female    DOB: 12-Jun-1966, 58 y.o.   MRN: [id number]  HPI: Brandy Ballard is a 58 y.o. female presenting on 02/17/2024 for comprehensive medical examination. Current medical complaints include:  HYPOTHYROIDISM Thyroid control status: unsure- seems worse, having swelling of her thyroid for the past 2 weeks Satisfied with current treatment? unsure Etiology of hypothyroidism: hashimotos Fatigue: yes Cold intolerance: no Heat intolerance: yes Weight gain: no Weight loss: no Constipation: yes Diarrhea/loose stools: no Palpitations: yes Lower extremity edema: yes Anxiety/depressed mood: no  HYPERLIPIDEMIA Hyperlipidemia status: stable Satisfied with current treatment?  yes Side effects:  N/A Past cholesterol meds: none Supplements: none Aspirin:  no The 10-year ASCVD risk score (Arnett DK, et al., 2019) is: 3%   Values used to calculate the score:     Age: 14 years     Sex: Female     Is Non-Hispanic African American: No     Diabetic: No     Tobacco smoker: No     Systolic Blood Pressure: 114 mmHg     Is BP treated: No     HDL Cholesterol: 44 mg/dL     Total Cholesterol: 279 mg/dL Chest pain:  no Coronary artery disease:  no  Has been doing the FODMAP diet to help with her diverticulitis. She has been having GERD- taking pepcid as needed.   Menopausal Symptoms: yes  Depression Screen done today and results listed below:     02/17/2024    8:54 AM 02/10/2023    1:48 PM 05/28/2022   10:54 AM 10/22/2021    8:56 AM 09/11/2021    8:24 AM  Depression screen PHQ 2/9  Decreased Interest 0 0 0 0 0  Down, Depressed, Hopeless 0 0 0 0 0  PHQ - 2 Score 0 0 0 0 0  Altered sleeping 3 0 1 3   Tired, decreased energy 3 0 1 3   Change in appetite 0 0 0 0   Feeling bad  or failure about yourself  0 0 0 0   Trouble concentrating 0 0 0 0   Moving slowly or fidgety/restless 0 0 0 0   Suicidal thoughts 0 0 0 0   PHQ-9 Score 6 0 2 6   Difficult doing work/chores Somewhat difficult   Somewhat difficult     Past Medical History:  Past Medical History:  Diagnosis Date   Abdominal pain, epigastric    Achilles tendinitis, right leg    Allergy    Anemia    Arthritis    lower back   Benign neoplasm of cecum    Blood transfusion without reported diagnosis    Dental bridge present    permanent lower retainer   Gastric polyp    GERD (gastroesophageal reflux disease)    Hashimoto's disease    Heel spur    bilateral   Hyperlipidemia    Plantar fasciitis    bilateral   Polyp of nasal sinus    multiple   Polyp of sigmoid colon    PONV (postoperative nausea and vomiting)    nausea only   Sinus disease    Thyroid disease    Ulcer     Surgical History:  Past Surgical History:  Procedure Laterality Date   COLONOSCOPY WITH PROPOFOL  N/A 07/19/2018   Procedure: COLONOSCOPY WITH Biopsies;  Surgeon: Marnee Sink, MD;  Location: [facility];  Service: Endoscopy;  Laterality: N/A;   ESOPHAGOGASTRODUODENOSCOPY (EGD) WITH PROPOFOL  N/A 11/09/2018   Procedure: ESOPHAGOGASTRODUODENOSCOPY (EGD) WITH BIOPSIES;  Surgeon: Irby Mannan, MD;  Location: [facility];  Service: Endoscopy;  Laterality: N/A;   FRONTAL SINUS EXPLORATION Bilateral 10/05/2018   Procedure: FRONTAL SINUS EXPLORATION;  Surgeon: Lesly Raspberry, MD;  Location: [facility];  Service: ENT;  Laterality: Bilateral;   IMAGE GUIDED SINUS SURGERY N/A 10/05/2018   Procedure: IMAGE GUIDED SINUS SURGERY;  Surgeon: Lesly Raspberry, MD;  Location: [facility];  Service: ENT;  Laterality: N/A;   NASAL TURBINATE REDUCTION  10/05/2018   Procedure: TURBINATE REDUCTION/SUBMUCOSAL RESECTION;  Surgeon: Lesly Raspberry, MD;  Location: [facility];  Service: ENT;;   OVARIAN CYST REMOVAL      POLYPECTOMY N/A 07/19/2018   Procedure: POLYPECTOMY;  Surgeon: Marnee Sink, MD;  Location: [facility];  Service: Endoscopy;  Laterality: N/A;   SEPTOPLASTY WITH ETHMOIDECTOMY, AND MAXILLARY ANTROSTOMY Bilateral 10/05/2018   Procedure: SEPTOPLASTY WITH TOTAL ETHMOIDECTOMY, AND MAXILLARY ANTROSTOMY WITH TISSUE REMOVAL;  Surgeon: Lesly Raspberry, MD;  Location: [facility];  Service: ENT;  Laterality: Bilateral;   SPHENOIDECTOMY  10/05/2018   Procedure: TOTAL SPHENOIDECTOMY;  Surgeon: Lesly Raspberry, MD;  Location: [facility];  Service: ENT;;   TOTAL ABDOMINAL HYSTERECTOMY      Medications:  Current Outpatient Medications on File Prior to Visit  Medication Sig   melatonin 5 MG TABS Take 5 mg by mouth at bedtime.   metoCLOPramide  (REGLAN ) 10 MG tablet Take 1 tablet (10 mg total) by mouth every 8 (eight) hours as needed for nausea.   mometasone  (NASONEX ) 50 MCG/ACT nasal spray Place 2 sprays into the nose daily.   [DISCONTINUED] atorvastatin  (LIPITOR) 20 MG tablet Take 1 tablet (20 mg total) by mouth daily.   No current facility-administered medications on file prior to visit.    Allergies:  Allergies  Allergen Reactions   Nsaids     Avoids due to Hx Peptic ulcers   Clearasil Daily Clear Acne [Benzoyl Peroxide] Rash    Social History:  Social History   Socioeconomic History   Marital status: Married    Spouse name: Not on file   Number of children: Not on file   Years of education: Not on file   Highest education level: Not on file  Occupational History   Not on file  Tobacco Use   Smoking status: Former    Current packs/day: 0.00    Average packs/day: 0.3 packs/day for 20.0 years (5.0 ttl pk-yrs)    Types: Cigarettes    Start date: 11/29/1998    Quit date: 11/29/2018    Years since quitting: 5.2   Smokeless tobacco: Never   Tobacco comments:    down to 2-4 daily  Vaping Use   Vaping status: Never Used  Substance and Sexual Activity   Alcohol use: Not Currently    Drug use: Never   Sexual activity: Not Currently    Birth control/protection: Post-menopausal, Surgical  Other Topics Concern   Not on file  Social History Narrative   Not on file   Social Drivers of Health   Financial Resource Strain: Patient Declined (02/15/2024)   Overall Financial Resource Strain (CARDIA)    Difficulty of Paying Living Expenses: Patient declined  Food Insecurity: Patient Declined (02/15/2024)   Hunger Vital Sign  Worried About Programme researcher, broadcasting/film/video in the Last Year: Patient declined    Barista in the Last Year: Patient declined  Transportation Needs: Patient Declined (02/15/2024)   PRAPARE - Administrator, Civil Service (Medical): Patient declined    Lack of Transportation (Non-Medical): Patient declined  Physical Activity: Patient Declined (02/17/2024)   Exercise Vital Sign    Days of Exercise per Week: Patient declined    Minutes of Exercise per Session: Patient declined  Stress: Patient Declined (02/15/2024)   [employer] of Occupational Health - Occupational Stress Questionnaire    Feeling of Stress : Patient declined  Social Connections: Unknown (02/15/2024)   Social Connection and Isolation Panel [NHANES]    Frequency of Communication with Friends and Family: Patient declined    Frequency of Social Gatherings with Friends and Family: Patient declined    Attends Religious Services: Patient declined    Database administrator or Organizations: Patient declined    Attends Banker Meetings: Not on file    Marital Status: Married  Intimate Partner Violence: Patient Declined (02/17/2024)   Humiliation, Afraid, Rape, and Kick questionnaire    Fear of Current or Ex-Partner: Patient declined    Emotionally Abused: Patient declined    Physically Abused: Patient declined    Sexually Abused: Patient declined   Social History   Tobacco Use  Smoking Status Former   Current packs/day: 0.00   Average packs/day: 0.3 packs/day  for 20.0 years (5.0 ttl pk-yrs)   Types: Cigarettes   Start date: 11/29/1998   Quit date: 11/29/2018   Years since quitting: 5.2  Smokeless Tobacco Never  Tobacco Comments   down to 2-4 daily   Social History   Substance and Sexual Activity  Alcohol Use Not Currently    Family History:  Family History  Problem Relation Age of Onset   Meniere's disease Mother    Chiari malformation Mother    ADD / ADHD Mother    Hearing loss Mother    Hypertension Father    Atrial fibrillation Father    CVA Father    ADD / ADHD Father    Stroke Father    Breast cancer Paternal Grandmother 34   Heart attack Paternal Grandfather    CVA Paternal Grandfather    ADD / ADHD Daughter    Polycystic ovary syndrome Daughter    Breast cancer Paternal Aunt        over 84    Past medical history, surgical history, medications, allergies, family history and social history reviewed with patient today and changes made to appropriate areas of the chart.   Review of Systems  Constitutional:  Positive for diaphoresis. Negative for chills, fever, malaise/fatigue and weight loss.  HENT:  Negative for congestion, ear discharge, ear pain, hearing loss, nosebleeds, sinus pain, sore throat and tinnitus.        Itchy ear canals  Eyes: Negative.   Respiratory: Negative.  Negative for stridor.   Cardiovascular:  Positive for palpitations and leg swelling (with being on her feet for a long time). Negative for chest pain, orthopnea, claudication and PND.  Gastrointestinal:  Positive for constipation and heartburn. Negative for abdominal pain, blood in stool, diarrhea, melena, nausea and vomiting.  Genitourinary: Negative.   Musculoskeletal:  Positive for back pain and myalgias. Negative for falls, joint pain and neck pain.  Skin: Negative.        Has a bunch of moles and bumps  Neurological: Negative.  Endo/Heme/Allergies: Negative.   Psychiatric/Behavioral: Negative.     All other ROS negative except what is  listed above and in the HPI.      Objective:     BP 114/68 (BP Location: Left Arm, Patient Position: Sitting, Cuff Size: Large)   Pulse 93   Ht 5' 5.5" (1.664 m)   Wt 205 lb 9.6 oz (93.3 kg)   SpO2 97%   BMI 33.69 kg/m   Wt Readings from Last 3 Encounters:  02/17/24 205 lb 9.6 oz (93.3 kg)  06/26/23 204 lb (92.5 kg)  02/10/23 212 lb 6.4 oz (96.3 kg)    Physical Exam Vitals and nursing note reviewed.  Constitutional:      General: She is not in acute distress.    Appearance: Normal appearance. She is not ill-appearing, toxic-appearing or diaphoretic.  HENT:     Head: Normocephalic and atraumatic.     Right Ear: Ear canal and external ear normal. There is impacted cerumen.     Left Ear: Ear canal and external ear normal. There is impacted cerumen.     Nose: Nose normal. No congestion or rhinorrhea.     Mouth/Throat:     Mouth: Mucous membranes are moist.     Pharynx: Oropharynx is clear. No oropharyngeal exudate or posterior oropharyngeal erythema.  Eyes:     General: No scleral icterus.       Right eye: No discharge.        Left eye: No discharge.     Extraocular Movements: Extraocular movements intact.     Conjunctiva/sclera: Conjunctivae normal.     Pupils: Pupils are equal, round, and reactive to light.  Neck:     Vascular: No carotid bruit.  Cardiovascular:     Rate and Rhythm: Normal rate and regular rhythm.     Pulses: Normal pulses.     Heart sounds: No murmur heard.    No friction rub. No gallop.  Pulmonary:     Effort: Pulmonary effort is normal. No respiratory distress.     Breath sounds: Normal breath sounds. No stridor. No wheezing, rhonchi or rales.  Chest:     Chest wall: No tenderness.  Abdominal:     General: Abdomen is flat. Bowel sounds are normal. There is no distension.     Palpations: Abdomen is soft. There is no mass.     Tenderness: There is no abdominal tenderness. There is no right CVA tenderness, left CVA tenderness, guarding or rebound.      Hernia: No hernia is present.  Genitourinary:    Comments: Breast and pelvic exams deferred with shared decision making Musculoskeletal:        General: No swelling, tenderness, deformity or signs of injury.     Cervical back: Normal range of motion and neck supple. No rigidity. No muscular tenderness.     Right lower leg: No edema.     Left lower leg: No edema.     Comments: Boot on R foot and ankle  Lymphadenopathy:     Cervical: No cervical adenopathy.  Skin:    General: Skin is warm and dry.     Capillary Refill: Capillary refill takes less than 2 seconds.     Coloration: Skin is not jaundiced or pale.     Findings: No bruising, erythema, lesion or rash.  Neurological:     General: No focal deficit present.     Mental Status: She is alert and oriented to person, place, and time. Mental status is at  baseline.     Cranial Nerves: No cranial nerve deficit.     Sensory: No sensory deficit.     Motor: No weakness.     Coordination: Coordination normal.     Gait: Gait normal.     Deep Tendon Reflexes: Reflexes normal.  Psychiatric:        Mood and Affect: Mood normal.        Behavior: Behavior normal.        Thought Content: Thought content normal.        Judgment: Judgment normal.     Results for orders placed or performed during the hospital encounter of 06/26/23  Lipase, blood   Collection Time: 06/26/23  9:26 PM  Result Value Ref Range   Lipase 34 11 - 51 U/L  Comprehensive metabolic panel   Collection Time: 06/26/23  9:26 PM  Result Value Ref Range   Sodium 138 135 - 145 mmol/L   Potassium 3.9 3.5 - 5.1 mmol/L   Chloride 103 98 - 111 mmol/L   CO2 22 22 - 32 mmol/L   Glucose, Bld 109 (H) 70 - 99 mg/dL   BUN 15 6 - 20 mg/dL   Creatinine, Ser 1.61 0.44 - 1.00 mg/dL   Calcium  9.4 8.9 - 10.3 mg/dL   Total Protein 7.7 6.5 - 8.1 g/dL   Albumin 3.9 3.5 - 5.0 g/dL   AST 18 15 - 41 U/L   ALT 27 0 - 44 U/L   Alkaline Phosphatase 51 38 - 126 U/L   Total Bilirubin 0.6  0.3 - 1.2 mg/dL   GFR, Estimated >09 >60 mL/min   Anion gap 13 5 - 15  CBC   Collection Time: 06/26/23  9:26 PM  Result Value Ref Range   WBC 10.0 4.0 - 10.5 K/uL   RBC 4.94 3.87 - 5.11 MIL/uL   Hemoglobin 15.5 (H) 12.0 - 15.0 g/dL   HCT 45.4 09.8 - 11.9 %   MCV 92.7 80.0 - 100.0 fL   MCH 31.4 26.0 - 34.0 pg   MCHC 33.8 30.0 - 36.0 g/dL   RDW 14.7 82.9 - 56.2 %   Platelets 353 150 - 400 K/uL   nRBC 0.0 0.0 - 0.2 %  Urinalysis, Routine w reflex microscopic -Urine, Clean Catch   Collection Time: 06/27/23 12:00 AM  Result Value Ref Range   Color, Urine YELLOW (A) YELLOW   APPearance CLEAR (A) CLEAR   Specific Gravity, Urine 1.044 (H) 1.005 - 1.030   pH 5.0 5.0 - 8.0   Glucose, UA NEGATIVE NEGATIVE mg/dL   Hgb urine dipstick NEGATIVE NEGATIVE   Bilirubin Urine NEGATIVE NEGATIVE   Ketones, ur NEGATIVE NEGATIVE mg/dL   Protein, ur NEGATIVE NEGATIVE mg/dL   Nitrite NEGATIVE NEGATIVE   Leukocytes,Ua SMALL (A) NEGATIVE   RBC / HPF 0-5 0 - 5 RBC/hpf   WBC, UA 0-5 0 - 5 WBC/hpf   Bacteria, UA RARE (A) NONE SEEN   Squamous Epithelial / HPF 0-5 0 - 5 /HPF   Mucus PRESENT       Assessment & Plan:   Problem List Items Addressed This Visit       Cardiovascular and Mediastinum   Aortic atherosclerosis (HCC)   Will keep BP and cholesterol under good control. Continue to monitor. Call with any concerns.       Relevant Orders   Comprehensive metabolic panel with GFR   Lipid Panel w/o Chol/HDL Ratio     Endocrine   Hypothyroidism  Rechecking labs today. Await results. Treat as needed.       Relevant Orders   Comprehensive metabolic panel with GFR   Thyroid Panel With TSH     Other   Hyperlipidemia   Rechecking labs today. Await results. Treat as needed.       Relevant Orders   Comprehensive metabolic panel with GFR   Lipid Panel w/o Chol/HDL Ratio   Other Visit Diagnoses       Routine general medical examination at a health care facility    -  Primary   Vaccines up  to date. Screening labs checked today. Pap N/A. Mammo and colonoscopy ordered. Continue diet and exercise. Call with any concerns.   Relevant Orders   CBC with Differential/Platelet   Comprehensive metabolic panel with GFR   Lipid Panel w/o Chol/HDL Ratio     Thyromegaly       Will check labs and US . Await results. Treat as needed.   Relevant Orders   US  THYROID     Screening for colon cancer       Referral to GI placed today.   Relevant Orders   Ambulatory referral to Gastroenterology     Encounter for screening mammogram for malignant neoplasm of breast       Mammogram ordered today.   Relevant Orders   MM 3D SCREENING MAMMOGRAM BILATERAL BREAST     Bilateral hearing loss due to cerumen impaction       Ears flushed today.        Follow up plan: Return 6-8 weeks, for follow up thyroid.   LABORATORY TESTING:  - Pap smear: not applicable  IMMUNIZATIONS:   - Tdap: Tetanus vaccination status reviewed: last tetanus booster within 10 years. - Influenza: Refused - Pneumovax: Not applicable - Prevnar: Not applicable - COVID: Refused - HPV: Not applicable - Shingrix  vaccine: Up to date  SCREENING: -Mammogram: Ordered today  - Colonoscopy: Ordered today   PATIENT COUNSELING:   Advised to take 1 mg of folate supplement per day if capable of pregnancy.   Sexuality: Discussed sexually transmitted diseases, partner selection, use of condoms, avoidance of unintended pregnancy  and contraceptive alternatives.   Advised to avoid cigarette smoking.  I discussed with the patient that most people either abstain from alcohol or drink within safe limits (<=14/week and <=4 drinks/occasion for males, <=7/weeks and <= 3 drinks/occasion for females) and that the risk for alcohol disorders and other health effects rises proportionally with the number of drinks per week and how often a drinker exceeds daily limits.  Discussed cessation/primary prevention of drug use and availability of  treatment for abuse.   Diet: Encouraged to adjust caloric intake to maintain  or achieve ideal body weight, to reduce intake of dietary saturated fat and total fat, to limit sodium intake by avoiding high sodium foods and not adding table salt, and to maintain adequate dietary potassium and calcium  preferably from fresh fruits, vegetables, and low-fat dairy products.    stressed the importance of regular exercise  Injury prevention: Discussed safety belts, safety helmets, smoke detector, smoking near bedding or upholstery.   Dental health: Discussed importance of regular tooth brushing, flossing, and dental visits.    NEXT PREVENTATIVE PHYSICAL DUE IN 1 YEAR. Return 6-8 weeks, for follow up thyroid.

## 2024-02-18 ENCOUNTER — Ambulatory Visit (INDEPENDENT_AMBULATORY_CARE_PROVIDER_SITE_OTHER)

## 2024-02-18 ENCOUNTER — Ambulatory Visit (INDEPENDENT_AMBULATORY_CARE_PROVIDER_SITE_OTHER): Payer: Self-pay | Admitting: Podiatry

## 2024-02-18 DIAGNOSIS — M7661 Achilles tendinitis, right leg: Secondary | ICD-10-CM | POA: Diagnosis not present

## 2024-02-18 DIAGNOSIS — M79671 Pain in right foot: Secondary | ICD-10-CM

## 2024-02-18 LAB — COMPREHENSIVE METABOLIC PANEL WITH GFR
ALT: 33 IU/L — ABNORMAL HIGH (ref 0–32)
AST: 18 IU/L (ref 0–40)
Albumin: 4.5 g/dL (ref 3.8–4.9)
Alkaline Phosphatase: 70 IU/L (ref 44–121)
BUN/Creatinine Ratio: 24 — ABNORMAL HIGH (ref 9–23)
BUN: 13 mg/dL (ref 6–24)
Bilirubin Total: 0.2 mg/dL (ref 0.0–1.2)
CO2: 23 mmol/L (ref 20–29)
Calcium: 9.7 mg/dL (ref 8.7–10.2)
Chloride: 100 mmol/L (ref 96–106)
Creatinine, Ser: 0.54 mg/dL — ABNORMAL LOW (ref 0.57–1.00)
Globulin, Total: 3.1 g/dL (ref 1.5–4.5)
Glucose: 107 mg/dL — ABNORMAL HIGH (ref 70–99)
Potassium: 4.4 mmol/L (ref 3.5–5.2)
Sodium: 139 mmol/L (ref 134–144)
Total Protein: 7.6 g/dL (ref 6.0–8.5)
eGFR: 108 mL/min/{1.73_m2} (ref >59)

## 2024-02-18 LAB — THYROID PANEL WITH TSH
Free Thyroxine Index: 1.6 (ref 1.2–4.9)
T3 Uptake Ratio: 23 % — ABNORMAL LOW (ref 24–39)
T4, Total: 7 ug/dL (ref 4.5–12.0)
TSH: 4.16 u[IU]/mL (ref 0.450–4.500)

## 2024-02-18 LAB — CBC WITH DIFFERENTIAL/PLATELET
Basophils Absolute: 0.1 10*3/uL (ref 0.0–0.2)
Basos: 1 %
EOS (ABSOLUTE): 0.6 10*3/uL — ABNORMAL HIGH (ref 0.0–0.4)
Eos: 8 %
Hematocrit: 47.4 % — ABNORMAL HIGH (ref 34.0–46.6)
Hemoglobin: 16.1 g/dL — ABNORMAL HIGH (ref 11.1–15.9)
Immature Grans (Abs): 0 10*3/uL (ref 0.0–0.1)
Immature Granulocytes: 0 %
Lymphocytes Absolute: 1.7 10*3/uL (ref 0.7–3.1)
Lymphs: 22 %
MCH: 31.9 pg (ref 26.6–33.0)
MCHC: 34 g/dL (ref 31.5–35.7)
MCV: 94 fL (ref 79–97)
Monocytes Absolute: 0.5 10*3/uL (ref 0.1–0.9)
Monocytes: 6 %
Neutrophils Absolute: 4.8 10*3/uL (ref 1.4–7.0)
Neutrophils: 63 %
Platelets: 378 10*3/uL (ref 150–450)
RBC: 5.04 x10E6/uL (ref 3.77–5.28)
RDW: 12.5 % (ref 11.7–15.4)
WBC: 7.7 10*3/uL (ref 3.4–10.8)

## 2024-02-18 LAB — LIPID PANEL W/O CHOL/HDL RATIO
Cholesterol, Total: 315 mg/dL — ABNORMAL HIGH (ref 100–199)
HDL: 48 mg/dL (ref >39)
LDL Chol Calc (NIH): 222 mg/dL — ABNORMAL HIGH (ref 0–99)
Triglycerides: 224 mg/dL — ABNORMAL HIGH (ref 0–149)
VLDL Cholesterol Cal: 45 mg/dL — ABNORMAL HIGH (ref 5–40)

## 2024-02-18 NOTE — Progress Notes (Signed)
 Subjective:  Patient ID: Brandy Ballard, female    DOB: Jun 24, 1966,  MRN: [id number]  Chief Complaint  Patient presents with   Foot Pain    Right foot pain     58 y.o. female presents with the above complaint.  Patient presents with continuous pain from right Achilles tendon.  She states she was able to manage a little bit but now has progressive gotten worse.  She is currently in a cam boot which has not helped.  She does not want to do an injection.  For now she would like to now obtain an MRI.  She has been dealing with this for many years now.  Pain scale is 5 out of 10 dull aching nature  Review of Systems: Negative except as noted in the HPI. Denies N/V/F/Ch.  Past Medical History:  Diagnosis Date   Abdominal pain, epigastric    Achilles tendinitis, right leg    Allergy    Anemia    Arthritis    lower back   Benign neoplasm of cecum    Blood transfusion without reported diagnosis    Dental bridge present    permanent lower retainer   Gastric polyp    GERD (gastroesophageal reflux disease)    Hashimoto's disease    Heel spur    bilateral   Hyperlipidemia    Plantar fasciitis    bilateral   Polyp of nasal sinus    multiple   Polyp of sigmoid colon    PONV (postoperative nausea and vomiting)    nausea only   Sinus disease    Thyroid disease    Ulcer     Current Outpatient Medications:    melatonin 5 MG TABS, Take 5 mg by mouth at bedtime., Disp: , Rfl:    metoCLOPramide  (REGLAN ) 10 MG tablet, Take 1 tablet (10 mg total) by mouth every 8 (eight) hours as needed for nausea., Disp: 60 tablet, Rfl: 0   mometasone  (NASONEX ) 50 MCG/ACT nasal spray, Place 2 sprays into the nose daily., Disp: 17 g, Rfl: 12  Social History   Tobacco Use  Smoking Status Former   Current packs/day: 0.00   Average packs/day: 0.3 packs/day for 20.0 years (5.0 ttl pk-yrs)   Types: Cigarettes   Start date: 11/29/1998   Quit date: 11/29/2018   Years since quitting: 5.2  Smokeless Tobacco Never   Tobacco Comments   down to 2-4 daily    Allergies  Allergen Reactions   Nsaids     Avoids due to Hx Peptic ulcers   Clearasil Daily Clear Acne [Benzoyl Peroxide] Rash   Objective:  There were no vitals filed for this visit. There is no height or weight on file to calculate BMI. Constitutional Well developed. Well nourished.  Vascular Dorsalis pedis pulses palpable bilaterally. Posterior tibial pulses palpable bilaterally. Capillary refill normal to all digits.  No cyanosis or clubbing noted. Pedal hair growth normal.  Neurologic Normal speech. Oriented to person, place, and time. Epicritic sensation to light touch grossly present bilaterally.  Dermatologic Nails well groomed and normal in appearance. No open wounds. No skin lesions.  Orthopedic: Pain on palpation to the right posterior heel.  Clinically palpable posterior spurring noted positive Silfverskiold test bilaterally with gastrocnemius equinus.  Tight plantar fascia noted.  No pain at the calcaneal tuber at this time bilaterally.  Haglund's deformity noted bilaterally   Radiographs: 3 views of skeletally mature the bilateral foot: Posterior spurring noted at the Achilles tendon insertion.  Os trigonum syndrome noted  bilaterally.  Plantar heel spur noted as well.  Pes cavus foot structure noted. Assessment:   1. Right foot pain      Plan:  Patient was evaluated and treated and all questions answered.  Right Achilles tendinitis with underlying equinus gastrocnemius -I explained to the patient the etiology of Achilles tendinitis and various treatment options were extensively discussed.   - Clinically she has failed cam boot immobilization in the past she has failed steroid injection at this time patient will benefit from an MRI evaluation. - MRI was ordered to rule out Achilles tendon tearing  No follow-ups on file.

## 2024-02-19 ENCOUNTER — Ambulatory Visit: Payer: Self-pay | Admitting: Family Medicine

## 2024-02-19 DIAGNOSIS — E069 Thyroiditis, unspecified: Secondary | ICD-10-CM

## 2024-02-19 DIAGNOSIS — E010 Iodine-deficiency related diffuse (endemic) goiter: Secondary | ICD-10-CM

## 2024-02-23 ENCOUNTER — Other Ambulatory Visit: Payer: Self-pay | Admitting: Podiatry

## 2024-02-23 DIAGNOSIS — M7661 Achilles tendinitis, right leg: Secondary | ICD-10-CM

## 2024-02-23 DIAGNOSIS — M79671 Pain in right foot: Secondary | ICD-10-CM

## 2024-02-25 ENCOUNTER — Ambulatory Visit
Admission: RE | Admit: 2024-02-25 | Discharge: 2024-02-25 | Disposition: A | Discharge status: Home / Self Care | Source: Ambulatory Visit | Attending: Family Medicine | Admitting: Family Medicine

## 2024-02-25 DIAGNOSIS — E010 Iodine-deficiency related diffuse (endemic) goiter: Secondary | ICD-10-CM | POA: Diagnosis not present

## 2024-02-25 DIAGNOSIS — E0789 Other specified disorders of thyroid: Secondary | ICD-10-CM | POA: Diagnosis not present

## 2024-03-01 ENCOUNTER — Ambulatory Visit
Admission: RE | Admit: 2024-03-01 | Discharge: 2024-03-01 | Disposition: A | Discharge status: Home / Self Care | Source: Ambulatory Visit | Attending: Podiatry

## 2024-03-01 DIAGNOSIS — M7661 Achilles tendinitis, right leg: Secondary | ICD-10-CM

## 2024-03-02 NOTE — Telephone Encounter (Signed)
 Patient advised that although results are back they have yet to be reviewed by provider. Once reviewed and comments added we will call patient back for an update.

## 2024-03-02 NOTE — Telephone Encounter (Signed)
 Copied from CRM [id number]. Topic: Clinical - Lab/Test Results >> Mar 02, 2024 10:44 AM Crispin Dolphin wrote: Reason for CRM: Patient called states she had thyroid  scan and results have been uploaded but she would like to know what next steps are. Is provider going to send anything in. Would like a call back. Thank You

## 2024-03-10 ENCOUNTER — Ambulatory Visit (INDEPENDENT_AMBULATORY_CARE_PROVIDER_SITE_OTHER): Admitting: Podiatry

## 2024-03-10 ENCOUNTER — Other Ambulatory Visit: Payer: Self-pay

## 2024-03-10 DIAGNOSIS — M7661 Achilles tendinitis, right leg: Secondary | ICD-10-CM | POA: Diagnosis not present

## 2024-03-10 DIAGNOSIS — M9261 Juvenile osteochondrosis of tarsus, right ankle: Secondary | ICD-10-CM | POA: Diagnosis not present

## 2024-03-10 DIAGNOSIS — M62461 Contracture of muscle, right lower leg: Secondary | ICD-10-CM | POA: Diagnosis not present

## 2024-03-10 MED ORDER — METOCLOPRAMIDE HCL 10 MG PO TABS
10.0000 mg | ORAL_TABLET | Freq: Three times a day (TID) | ORAL | 0 refills | Status: DC
  Filled 2024-03-10: qty 60, 15d supply, fill #0

## 2024-03-10 MED ORDER — KETOROLAC TROMETHAMINE 10 MG PO TABS
10.0000 mg | ORAL_TABLET | Freq: Four times a day (QID) | ORAL | 0 refills | Status: DC | PRN
  Filled 2024-03-10: qty 20, 5d supply, fill #0

## 2024-03-10 NOTE — Addendum Note (Signed)
 Addended by: Dani Wallner on: 03/10/2024 02:07 PM   Modules accepted: Orders

## 2024-03-10 NOTE — Progress Notes (Signed)
 Subjective:  Patient ID: Brandy Ballard, female    DOB: 1966-08-23,  MRN: [id number]  Chief Complaint  Patient presents with   Foot Pain    Right foot pain follow up     58 y.o. female presents with the above complaint.  Patient presents with continuous pain from right Achilles tendon.  She states the pain is about the same has progressively gotten worse she is here to go over the MRI  Review of Systems: Negative except as noted in the HPI. Denies N/V/F/Ch.  Past Medical History:  Diagnosis Date   Abdominal pain, epigastric    Achilles tendinitis, right leg    Allergy    Anemia    Arthritis    lower back   Benign neoplasm of cecum    Blood transfusion without reported diagnosis    Dental bridge present    permanent lower retainer   Gastric polyp    GERD (gastroesophageal reflux disease)    Hashimoto's disease    Heel spur    bilateral   Hyperlipidemia    Plantar fasciitis    bilateral   Polyp of nasal sinus    multiple   Polyp of sigmoid colon    PONV (postoperative nausea and vomiting)    nausea only   Sinus disease    Thyroid  disease    Ulcer     Current Outpatient Medications:    melatonin 5 MG TABS, Take 5 mg by mouth at bedtime., Disp: , Rfl:    metoCLOPramide  (REGLAN ) 10 MG tablet, Take 1 tablet (10 mg total) by mouth every 8 (eight) hours as needed for nausea., Disp: 60 tablet, Rfl: 0   mometasone  (NASONEX ) 50 MCG/ACT nasal spray, Place 2 sprays into the nose daily., Disp: 17 g, Rfl: 12  Social History   Tobacco Use  Smoking Status Former   Current packs/day: 0.00   Average packs/day: 0.3 packs/day for 20.0 years (5.0 ttl pk-yrs)   Types: Cigarettes   Start date: 11/29/1998   Quit date: 11/29/2018   Years since quitting: 5.2  Smokeless Tobacco Never  Tobacco Comments   down to 2-4 daily    Allergies  Allergen Reactions   Nsaids     Avoids due to Hx Peptic ulcers   Clearasil Daily Clear Acne [Benzoyl Peroxide] Rash   Objective:  There were no  vitals filed for this visit. There is no height or weight on file to calculate BMI. Constitutional Well developed. Well nourished.  Vascular Dorsalis pedis pulses palpable bilaterally. Posterior tibial pulses palpable bilaterally. Capillary refill normal to all digits.  No cyanosis or clubbing noted. Pedal hair growth normal.  Neurologic Normal speech. Oriented to person, place, and time. Epicritic sensation to light touch grossly present bilaterally.  Dermatologic Nails well groomed and normal in appearance. No open wounds. No skin lesions.  Orthopedic: Pain on palpation to the right posterior heel.  Clinically palpable posterior spurring noted positive Silfverskiold test bilaterally with gastrocnemius equinus.  Tight plantar fascia noted.  No pain at the calcaneal tuber at this time bilaterally.  Haglund's deformity noted bilaterally   Radiographs: 3 views of skeletally mature the bilateral foot: Posterior spurring noted at the Achilles tendon insertion.  Os trigonum syndrome noted bilaterally.  Plantar heel spur noted as well.  Pes cavus foot structure noted. Assessment:   1. Achilles tendinitis, right leg   2. Haglund's deformity, right   3. Gastrocnemius equinus, right       Plan:  Patient was evaluated and treated and  all questions answered.  Right Achilles tendinitis with underlying equinus gastrocnemius -I explained to the patient the etiology of Achilles tendinitis and various treatment options were extensively discussed.   - Clinically she has failed all conservative treatment treatment options close shoe gear modification injection offloading at this time I discussed with the patient that she will benefit from surgical intervention - MRI was reviewed with the patient which shows interstitial tearing as well as tendinosis of the Achilles tendon therefore given that she has not had any resolve meant of pain she will benefit from surgical intervention - I discussed right  Achilles tendon detachment reattachment with debridement of the Achilles tendon and resection of Haglund's deformity with gastrocnemius recession I discussed my preoperative or postop plan with the patient extensive detail she states understand like to proceed with surgery. -Informed surgical risk consent was reviewed and read aloud to the patient.  I reviewed the films.  I have discussed my findings with the patient in great detail.  I have discussed all risks including but not limited to infection, stiffness, scarring, limp, disability, deformity, damage to blood vessels and nerves, numbness, poor healing, need for braces, arthritis, chronic pain, amputation, death.  All benefits and realistic expectations discussed in great detail.  I have made no promises as to the outcome.  I have provided realistic expectations.  I have offered the patient a 2nd opinion, which they have declined and assured me they preferred to proceed despite the risks   No follow-ups on file.

## 2024-03-11 ENCOUNTER — Telehealth: Payer: Self-pay | Admitting: Family Medicine

## 2024-03-11 DIAGNOSIS — E069 Thyroiditis, unspecified: Secondary | ICD-10-CM

## 2024-03-11 DIAGNOSIS — Z0271 Encounter for disability determination: Secondary | ICD-10-CM

## 2024-03-11 NOTE — Telephone Encounter (Signed)
 Copied from CRM [id number]. Topic: Referral - Status >> Mar 11, 2024  1:54 PM Fredrica W wrote: Reason for CRM: Ivette Marks called. Received referral. Referral denied. Provider states normal thyroid  lab, neck US  - endocrin does not treat or follow for this. Recommends thyroid  testing for screening for hypo or hyper thyroidism then can revisit based on results. Should also show in epic. Thank You

## 2024-03-15 ENCOUNTER — Telehealth: Payer: Self-pay | Admitting: Podiatry

## 2024-03-15 NOTE — Telephone Encounter (Signed)
 Can we please let patient know that endocrinology is declining her referral. I can try to send it to another endocrinologist, but we can also check some additional labs to see what's going on- if she'd like that I'll put the orders in.

## 2024-03-15 NOTE — Telephone Encounter (Signed)
 Pt returned call and left message.  I returned call and she is scheduled for 7/28 and is not on any blood thinners or taking any GLP1 medications. Pharmacy correct in chart.

## 2024-03-15 NOTE — Telephone Encounter (Signed)
 Pt left message this morning at 808am stating she was to have surgery and has not gotten a call.   I returned call and left message for pt to call me back to get scheduled for surgery. As of now next available is possibly 7/28

## 2024-03-15 NOTE — Telephone Encounter (Signed)
 Ok for E2C2 to review.  Please review information from provider.

## 2024-03-16 DIAGNOSIS — Z0271 Encounter for disability determination: Secondary | ICD-10-CM

## 2024-03-16 NOTE — Addendum Note (Signed)
 Addended by: Solomon Dupre on: 03/16/2024 09:22 PM   Modules accepted: Orders

## 2024-03-16 NOTE — Telephone Encounter (Signed)
 Sent mychart message to patient

## 2024-03-16 NOTE — Telephone Encounter (Signed)
 OK to come in for labs ASAP

## 2024-03-16 NOTE — Telephone Encounter (Signed)
 She says Can we just get started on some medication because its been 4 weeks? Can she treat it? I would like to get started on doing something that's all

## 2024-03-22 ENCOUNTER — Ambulatory Visit: Admitting: Podiatry

## 2024-03-23 ENCOUNTER — Telehealth: Payer: Self-pay | Admitting: Podiatry

## 2024-03-23 ENCOUNTER — Other Ambulatory Visit: Payer: Self-pay

## 2024-03-23 MED ORDER — OXYCODONE-ACETAMINOPHEN 5-325 MG PO TABS
1.0000 | ORAL_TABLET | ORAL | 0 refills | Status: DC | PRN
  Filled 2024-03-23: qty 30, 5d supply, fill #0

## 2024-03-23 NOTE — Telephone Encounter (Signed)
 Patient is requesting pain medication for pain. Patient contact telephone number, [phone]

## 2024-04-05 ENCOUNTER — Other Ambulatory Visit: Payer: Self-pay

## 2024-04-05 ENCOUNTER — Telehealth: Payer: Self-pay | Admitting: Podiatry

## 2024-04-05 MED ORDER — OXYCODONE-ACETAMINOPHEN 5-325 MG PO TABS
1.0000 | ORAL_TABLET | ORAL | 0 refills | Status: DC | PRN
  Filled 2024-04-05: qty 30, 5d supply, fill #0

## 2024-04-05 NOTE — Telephone Encounter (Signed)
 Patient requesting refill of oxyCODONE -acetaminophen  (PERCOCET) 5-325 MG  sent to [facility] . Thanks

## 2024-04-13 ENCOUNTER — Ambulatory Visit: Admitting: Family Medicine

## 2024-04-14 ENCOUNTER — Telehealth: Payer: Self-pay | Admitting: Podiatry

## 2024-04-14 NOTE — Telephone Encounter (Signed)
 DOS- 04/25/2024  RT POSTERIOR CALCANEAL OSTEOTOMY POP BLOCK- 28118 RT GASTROCNEIMUS RECESS- 72312 RT REPAIR RUPTURED ACHILLES TENDON PRIMARY- 72349  [employer] EFFECTIVE DATE- 09/29/2022  DEDUCTIBLE- $0 FAMILY- $3300 REMAINING- $1318.08 OOP- $4000 REMAINING- $2022.92 FAMILY- $8000 REMAINING- $6016.09 COINSURANCE- 20%  PER [employer] WEBSITE, NO PRIOR AUTHS REQUIRED FOR CPT CODES [phone].

## 2024-04-25 ENCOUNTER — Other Ambulatory Visit: Payer: Self-pay | Admitting: Podiatry

## 2024-04-25 ENCOUNTER — Other Ambulatory Visit: Payer: Self-pay

## 2024-04-25 DIAGNOSIS — M9261 Juvenile osteochondrosis of tarsus, right ankle: Secondary | ICD-10-CM | POA: Diagnosis not present

## 2024-04-25 DIAGNOSIS — E079 Disorder of thyroid, unspecified: Secondary | ICD-10-CM | POA: Diagnosis not present

## 2024-04-25 DIAGNOSIS — M7731 Calcaneal spur, right foot: Secondary | ICD-10-CM | POA: Diagnosis not present

## 2024-04-25 DIAGNOSIS — G8918 Other acute postprocedural pain: Secondary | ICD-10-CM | POA: Diagnosis not present

## 2024-04-25 DIAGNOSIS — S86011A Strain of right Achilles tendon, initial encounter: Secondary | ICD-10-CM | POA: Diagnosis not present

## 2024-04-25 DIAGNOSIS — M7661 Achilles tendinitis, right leg: Secondary | ICD-10-CM | POA: Diagnosis not present

## 2024-04-25 MED ORDER — OXYCODONE-ACETAMINOPHEN 5-325 MG PO TABS
1.0000 | ORAL_TABLET | ORAL | 0 refills | Status: DC | PRN
  Filled 2024-04-25: qty 30, 5d supply, fill #0

## 2024-04-29 ENCOUNTER — Telehealth: Payer: Self-pay | Admitting: Podiatry

## 2024-04-29 NOTE — Telephone Encounter (Signed)
 Patient called stating she had surgery Monday. Patient wants to know if she can take her boot off?

## 2024-04-29 NOTE — Telephone Encounter (Signed)
 Spoke with patient and informed her of directions and she understood.

## 2024-05-03 ENCOUNTER — Ambulatory Visit (INDEPENDENT_AMBULATORY_CARE_PROVIDER_SITE_OTHER): Admitting: Podiatry

## 2024-05-03 ENCOUNTER — Other Ambulatory Visit: Payer: Self-pay

## 2024-05-03 DIAGNOSIS — M9261 Juvenile osteochondrosis of tarsus, right ankle: Secondary | ICD-10-CM

## 2024-05-03 DIAGNOSIS — M7661 Achilles tendinitis, right leg: Secondary | ICD-10-CM

## 2024-05-03 DIAGNOSIS — Z9889 Other specified postprocedural states: Secondary | ICD-10-CM

## 2024-05-03 DIAGNOSIS — M62461 Contracture of muscle, right lower leg: Secondary | ICD-10-CM

## 2024-05-03 MED ORDER — DOXYCYCLINE HYCLATE 100 MG PO TABS
100.0000 mg | ORAL_TABLET | Freq: Two times a day (BID) | ORAL | 0 refills | Status: DC
  Filled 2024-05-03: qty 20, 10d supply, fill #0

## 2024-05-03 NOTE — Progress Notes (Signed)
 Subjective:  Patient ID: Brandy Ballard, female    DOB: May 28, 1966,  MRN: [id number]  Chief Complaint  Patient presents with   Routine Post Op    POV # 1 DOS 04/25/24 RT ACHILLES TENDON REPAIR W/ RESECTION OF HAGLAUND W/ GASTROCNEMIUS RECESSION W/ FIXATION    DOS: 04/25/2024 Procedure: Right Achilles tendon repair with resection of Haglund's deformity with gastrocnemius recession  58 y.o. female returns for post-op check.  Patient states that she is doing well.  Denies any other acute complaints she has been nonweightbearing to the right lower extremity bandages clean dry and intact pain is controlled  Review of Systems: Negative except as noted in the HPI. Denies N/V/F/Ch.  Past Medical History:  Diagnosis Date   Abdominal pain, epigastric    Achilles tendinitis, right leg    Allergy    Anemia    Arthritis    lower back   Benign neoplasm of cecum    Blood transfusion without reported diagnosis    Dental bridge present    permanent lower retainer   Gastric polyp    GERD (gastroesophageal reflux disease)    Hashimoto's disease    Heel spur    bilateral   Hyperlipidemia    Plantar fasciitis    bilateral   Polyp of nasal sinus    multiple   Polyp of sigmoid colon    PONV (postoperative nausea and vomiting)    nausea only   Sinus disease    Thyroid  disease    Ulcer     Current Outpatient Medications:    doxycycline  (VIBRA -TABS) 100 MG tablet, Take 1 tablet (100 mg total) by mouth 2 (two) times daily., Disp: 20 tablet, Rfl: 0   ketorolac  (TORADOL ) 10 MG tablet, Take 1 tablet (10 mg total) by mouth every 6 (six) hours as needed., Disp: 20 tablet, Rfl: 0   melatonin 5 MG TABS, Take 5 mg by mouth at bedtime., Disp: , Rfl:    metoCLOPramide  (REGLAN ) 10 MG tablet, Take 1 tablet (10 mg total) by mouth every 8 (eight) hours as needed for nausea., Disp: 60 tablet, Rfl: 0   metoCLOPramide  (REGLAN ) 10 MG tablet, Take 1 tablet (10 mg total) by mouth 4 (four) times daily -  before meals  and at bedtime., Disp: 60 tablet, Rfl: 0   mometasone  (NASONEX ) 50 MCG/ACT nasal spray, Place 2 sprays into the nose daily., Disp: 17 g, Rfl: 12   oxyCODONE -acetaminophen  (PERCOCET) 5-325 MG tablet, Take 1 tablet by mouth every 4 (four) hours as needed for severe pain (pain score 7-10)., Disp: 30 tablet, Rfl: 0  Social History   Tobacco Use  Smoking Status Former   Current packs/day: 0.00   Average packs/day: 0.3 packs/day for 20.0 years (5.0 ttl pk-yrs)   Types: Cigarettes   Start date: 11/29/1998   Quit date: 11/29/2018   Years since quitting: 5.4  Smokeless Tobacco Never  Tobacco Comments   down to 2-4 daily    Allergies  Allergen Reactions   Nsaids     Avoids due to Hx Peptic ulcers   Clearasil Daily Clear Acne [Benzoyl Peroxide] Rash   Objective:  There were no vitals filed for this visit. There is no height or weight on file to calculate BMI. Constitutional Well developed. Well nourished.  Vascular Foot warm and well perfused. Capillary refill normal to all digits.   Neurologic Normal speech. Oriented to person, place, and time. Epicritic sensation to light touch grossly present bilaterally.  Dermatologic Skin healing well without signs  of infection. Skin edges well coapted without signs of infection.  Orthopedic: Tenderness to palpation noted about the surgical site.   Radiographs: None Assessment:   1. Achilles tendinitis, right leg   2. Haglund's deformity, right   3. Gastrocnemius equinus, right   4. Status post foot surgery    Plan:  Patient was evaluated and treated and all questions answered.  S/p foot surgery right -Progressing as expected post-operatively. -XR: See above -WB Status: Nonweightbearing in to the right lower extremity -Sutures: Intact.  No clinical signs of dehiscence noted no complication noted. -Medications: None -Foot redressed.  No follow-ups on file.

## 2024-05-09 NOTE — Telephone Encounter (Signed)
 lfrt mess on vmail for pt. recd [employer] forms. DOs 04/25/24. I need to confirm her RTW date approx 07/11/24.

## 2024-05-10 DIAGNOSIS — Z0271 Encounter for disability determination: Secondary | ICD-10-CM

## 2024-05-10 NOTE — Telephone Encounter (Signed)
 S/w pt and adv approx RTW 07/11/24 and if she wants to go bk sooner let me know and I will adv [employer]. Faxed forms.notes [phone] and will email copy for her rcrds to email add on file

## 2024-05-17 ENCOUNTER — Ambulatory Visit (INDEPENDENT_AMBULATORY_CARE_PROVIDER_SITE_OTHER): Admitting: Podiatry

## 2024-05-17 DIAGNOSIS — M7661 Achilles tendinitis, right leg: Secondary | ICD-10-CM

## 2024-05-17 DIAGNOSIS — Z9889 Other specified postprocedural states: Secondary | ICD-10-CM

## 2024-05-17 DIAGNOSIS — M62461 Contracture of muscle, right lower leg: Secondary | ICD-10-CM

## 2024-05-17 DIAGNOSIS — M9261 Juvenile osteochondrosis of tarsus, right ankle: Secondary | ICD-10-CM

## 2024-05-17 NOTE — Progress Notes (Signed)
 Subjective:  Patient ID: Brandy Ballard, female    DOB: 1966/01/01,  MRN: [id number]  Chief Complaint  Patient presents with   Routine Post Op    POV # 2 DOS 04/25/24 RT ACHILLES TENDON REPAIR W/ RESECTION OF HAGLAUND W/ GASTROCNEMIUS RECESSION W/ FIXATION    DOS: 04/25/2024 Procedure: Right Achilles tendon repair with resection of Haglund's deformity with gastrocnemius recession  58 y.o. female returns for post-op check.  Patient states that she is doing well.  Denies any other acute complaints she has been nonweightbearing to the right lower extremity bandages clean dry and intact pain is controlled  Review of Systems: Negative except as noted in the HPI. Denies N/V/F/Ch.  Past Medical History:  Diagnosis Date   Abdominal pain, epigastric    Achilles tendinitis, right leg    Allergy    Anemia    Arthritis    lower back   Benign neoplasm of cecum    Blood transfusion without reported diagnosis    Dental bridge present    permanent lower retainer   Gastric polyp    GERD (gastroesophageal reflux disease)    Hashimoto's disease    Heel spur    bilateral   Hyperlipidemia    Plantar fasciitis    bilateral   Polyp of nasal sinus    multiple   Polyp of sigmoid colon    PONV (postoperative nausea and vomiting)    nausea only   Sinus disease    Thyroid  disease    Ulcer     Current Outpatient Medications:    doxycycline  (VIBRA -TABS) 100 MG tablet, Take 1 tablet (100 mg total) by mouth 2 (two) times daily., Disp: 20 tablet, Rfl: 0   ketorolac  (TORADOL ) 10 MG tablet, Take 1 tablet (10 mg total) by mouth every 6 (six) hours as needed., Disp: 20 tablet, Rfl: 0   melatonin 5 MG TABS, Take 5 mg by mouth at bedtime., Disp: , Rfl:    metoCLOPramide  (REGLAN ) 10 MG tablet, Take 1 tablet (10 mg total) by mouth every 8 (eight) hours as needed for nausea., Disp: 60 tablet, Rfl: 0   metoCLOPramide  (REGLAN ) 10 MG tablet, Take 1 tablet (10 mg total) by mouth 4 (four) times daily -  before meals  and at bedtime., Disp: 60 tablet, Rfl: 0   mometasone  (NASONEX ) 50 MCG/ACT nasal spray, Place 2 sprays into the nose daily., Disp: 17 g, Rfl: 12   oxyCODONE -acetaminophen  (PERCOCET) 5-325 MG tablet, Take 1 tablet by mouth every 4 (four) hours as needed for severe pain (pain score 7-10)., Disp: 30 tablet, Rfl: 0  Social History   Tobacco Use  Smoking Status Former   Current packs/day: 0.00   Average packs/day: 0.3 packs/day for 20.0 years (5.0 ttl pk-yrs)   Types: Cigarettes   Start date: 11/29/1998   Quit date: 11/29/2018   Years since quitting: 5.4  Smokeless Tobacco Never  Tobacco Comments   down to 2-4 daily    Allergies  Allergen Reactions   Nsaids     Avoids due to Hx Peptic ulcers   Clearasil Daily Clear Acne [Benzoyl Peroxide] Rash   Objective:  There were no vitals filed for this visit. There is no height or weight on file to calculate BMI. Constitutional Well developed. Well nourished.  Vascular Foot warm and well perfused. Capillary refill normal to all digits.   Neurologic Normal speech. Oriented to person, place, and time. Epicritic sensation to light touch grossly present bilaterally.  Dermatologic Skin completely epithelialized no signs  of dehiscence noted no complication noted.  Good range of motion noted of the ankle joint.  Orthopedic: Tenderness to palpation noted about the surgical site.   Radiographs: None Assessment:   1. Achilles tendinitis, right leg   2. Haglund's deformity, right   3. Gastrocnemius equinus, right   4. Status post foot surgery     Plan:  Patient was evaluated and treated and all questions answered.  S/p foot surgery right -Progressing as expected post-operatively. -XR: See above -WB Status: Weightbearing as tolerated in cam boot -Sutures: Removed no clinical signs of dehiscence noted no complication noted. -Medications: None - Patient will transition into regular shoes in 2 weeks with Tri-Lock ankle brace Tri-Lock ankle  brace was dispensed  No follow-ups on file.

## 2024-05-31 ENCOUNTER — Other Ambulatory Visit: Payer: Self-pay

## 2024-05-31 ENCOUNTER — Telehealth: Admitting: Physician Assistant

## 2024-05-31 DIAGNOSIS — B9689 Other specified bacterial agents as the cause of diseases classified elsewhere: Secondary | ICD-10-CM | POA: Diagnosis not present

## 2024-05-31 DIAGNOSIS — J019 Acute sinusitis, unspecified: Secondary | ICD-10-CM | POA: Diagnosis not present

## 2024-05-31 MED ORDER — PREDNISONE 10 MG PO TABS
ORAL_TABLET | ORAL | 0 refills | Status: AC
  Filled 2024-05-31: qty 21, 6d supply, fill #0

## 2024-05-31 MED ORDER — AMOXICILLIN-POT CLAVULANATE 875-125 MG PO TABS
1.0000 | ORAL_TABLET | Freq: Two times a day (BID) | ORAL | 0 refills | Status: DC
  Filled 2024-05-31: qty 14, 7d supply, fill #0

## 2024-05-31 NOTE — Progress Notes (Signed)
 Virtual Visit Consent   Brandy Ballard, you are scheduled for a virtual visit with a [facility] provider today. Just as with appointments in the office, your consent must be obtained to participate. Your consent will be active for this visit and any virtual visit you may have with one of our providers in the next 365 days. If you have a MyChart account, a copy of this consent can be sent to you electronically.  As this is a virtual visit, video technology does not allow for your provider to perform a traditional examination. This may limit your provider's ability to fully assess your condition. If your provider identifies any concerns that need to be evaluated in person or the need to arrange testing (such as labs, EKG, etc.), we will make arrangements to do so. Although advances in technology are sophisticated, we cannot ensure that it will always work on either your end or our end. If the connection with a video visit is poor, the visit may have to be switched to a telephone visit. With either a video or telephone visit, we are not always able to ensure that we have a secure connection.  By engaging in this virtual visit, you consent to the provision of healthcare and authorize for your insurance to be billed (if applicable) for the services provided during this visit. Depending on your insurance coverage, you may receive a charge related to this service.  I need to obtain your verbal consent now. Are you willing to proceed with your visit today? [name] has provided verbal consent on 05/31/2024 for a virtual visit (video or telephone). Brandy Ballard, [place]  Date: 05/31/2024 4:03 PM   Virtual Visit via Video Note   I, Brandy Ballard, connected with  Brandy Ballard  ([id number], [birth year]) on 05/31/24 at  4:00 PM EDT by a video-enabled telemedicine application and verified that I am speaking with the correct person using two identifiers.  Location: Patient: Virtual Visit Location Patient:  Home Provider: Virtual Visit Location Provider: Home Office   I discussed the limitations of evaluation and management by telemedicine and the availability of in person appointments. The patient expressed understanding and agreed to proceed.    History of Present Illness: Brandy Ballard is a 58 y.o. who identifies as a female who was assigned female at birth, and is being seen today for symptoms starting about 3 weeks ago -- Notes starting with nasal congestion, sinus congestion and sore throat. Thought initially just due to allergies but began to progress, especially with l-sided sinus congestion and pain, and change to thick green nasal discharge. Was on Doxycycline  at onset of symptoms after recent procedure (podiatric) which she thought would take care of this.   OTC -- cough drops, Chloraseptic spray, Allegra   HPI: HPI  Problems:  Patient Active Problem List   Diagnosis Date Noted   Aortic atherosclerosis (HCC) 05/28/2022   Fatty liver 05/28/2022   Diverticulosis 05/28/2022   Hyperlipidemia 09/17/2021   Migraine without status migrainosus, not intractable 09/12/2021   Sinus disease 09/12/2021   History of Hashimoto thyroiditis 09/11/2021   Osteoarthritis of spine with radiculopathy, lumbar region 09/11/2021   Spinal stenosis of lumbar region 09/11/2021   Gastroesophageal reflux disease    Osteoarthritis 05/17/2013   Esophageal reflux 05/17/2013   Hypothyroidism 05/17/2013    Allergies:  Allergies  Allergen Reactions   Nsaids     Avoids due to Hx Peptic ulcers   Clearasil Daily Clear Acne [Benzoyl Peroxide] Rash  Medications:  Current Outpatient Medications:    amoxicillin -clavulanate (AUGMENTIN ) 875-125 MG tablet, Take 1 tablet by mouth 2 (two) times daily., Disp: 14 tablet, Rfl: 0   predniSONE  (STERAPRED UNI-PAK 21 TAB) 10 MG (21) TBPK tablet, Take following package directions, Disp: 21 tablet, Rfl: 0   melatonin 5 MG TABS, Take 5 mg by mouth at bedtime., Disp: , Rfl:     mometasone  (NASONEX ) 50 MCG/ACT nasal spray, Place 2 sprays into the nose daily., Disp: 17 g, Rfl: 12  Observations/Objective: Patient is well-developed, well-nourished in no acute distress.  Resting comfortably at home.  Head is normocephalic, atraumatic.  No labored breathing.  Speech is clear and coherent with logical content.  Patient is alert and oriented at baseline.   Assessment and Plan: 1. Acute bacterial sinusitis (Primary) - amoxicillin -clavulanate (AUGMENTIN ) 875-125 MG tablet; Take 1 tablet by mouth 2 (two) times daily.  Dispense: 14 tablet; Refill: 0 - predniSONE  (STERAPRED UNI-PAK 21 TAB) 10 MG (21) TBPK tablet; Take following package directions  Dispense: 21 tablet; Refill: 0  Rx Augmentin  and Prednisone  pack.  Increase fluids.  Rest.  Saline nasal spray.  Probiotic.  Mucinex  as directed.  Humidifier in bedroom. Allegra OTC.  Call or return to clinic if symptoms are not improving.   Follow Up Instructions: I discussed the assessment and treatment plan with the patient. The patient was provided an opportunity to ask questions and all were answered. The patient agreed with the plan and demonstrated an understanding of the instructions.  A copy of instructions were sent to the patient via MyChart unless otherwise noted below.   The patient was advised to call back or seek an in-person evaluation if the symptoms worsen or if the condition fails to improve as anticipated.    Brandy Velma Lunger, PA-C

## 2024-05-31 NOTE — Patient Instructions (Signed)
 Brandy Ballard, thank you for joining Elsie Velma Lunger, PA-C for today's virtual visit.  While this provider is not your primary care provider (PCP), if your PCP is located in our provider database this encounter information will be shared with them immediately following your visit.   A [facility] MyChart account gives you access to today's visit and all your visits, tests, and labs performed at [facility]  click here if you don't have a [facility] MyChart account or go to [email or web address]  Consent: (Patient) Brandy Ballard provided verbal consent for this virtual visit at the beginning of the encounter.  Current Medications:  Current Outpatient Medications:    doxycycline  (VIBRA -TABS) 100 MG tablet, Take 1 tablet (100 mg total) by mouth 2 (two) times daily., Disp: 20 tablet, Rfl: 0   ketorolac  (TORADOL ) 10 MG tablet, Take 1 tablet (10 mg total) by mouth every 6 (six) hours as needed., Disp: 20 tablet, Rfl: 0   melatonin 5 MG TABS, Take 5 mg by mouth at bedtime., Disp: , Rfl:    metoCLOPramide  (REGLAN ) 10 MG tablet, Take 1 tablet (10 mg total) by mouth every 8 (eight) hours as needed for nausea., Disp: 60 tablet, Rfl: 0   metoCLOPramide  (REGLAN ) 10 MG tablet, Take 1 tablet (10 mg total) by mouth 4 (four) times daily -  before meals and at bedtime., Disp: 60 tablet, Rfl: 0   mometasone  (NASONEX ) 50 MCG/ACT nasal spray, Place 2 sprays into the nose daily., Disp: 17 g, Rfl: 12   oxyCODONE -acetaminophen  (PERCOCET) 5-325 MG tablet, Take 1 tablet by mouth every 4 (four) hours as needed for severe pain (pain score 7-10)., Disp: 30 tablet, Rfl: 0   Medications ordered in this encounter:  No orders of the defined types were placed in this encounter.    *If you need refills on other medications prior to your next appointment, please contact your pharmacy*  Follow-Up: Call back or seek an in-person evaluation if the symptoms worsen or if the condition fails to improve as  anticipated.  [facility] [phone]  Other Instructions Please take antibiotic as directed.  Increase fluid intake.  Use Saline nasal spray.  Take a daily multivitamin. Start the prednisone , taking as directed. Ok to continue your Allegra OTC.  Place a humidifier in the bedroom.  Please call or return clinic if symptoms are not improving.  Sinusitis Sinusitis is redness, soreness, and swelling (inflammation) of the paranasal sinuses. Paranasal sinuses are air pockets within the bones of your face (beneath the eyes, the middle of the forehead, or above the eyes). In healthy paranasal sinuses, mucus is able to drain out, and air is able to circulate through them by way of your nose. However, when your paranasal sinuses are inflamed, mucus and air can become trapped. This can allow bacteria and other germs to grow and cause infection. Sinusitis can develop quickly and last only a short time (acute) or continue over a long period (chronic). Sinusitis that lasts for more than 12 weeks is considered chronic.  CAUSES  Causes of sinusitis include: Allergies. Structural abnormalities, such as displacement of the cartilage that separates your nostrils (deviated septum), which can decrease the air flow through your nose and sinuses and affect sinus drainage. Functional abnormalities, such as when the small hairs (cilia) that line your sinuses and help remove mucus do not work properly or are not present. SYMPTOMS  Symptoms of acute and chronic sinusitis are the same. The primary symptoms are pain and pressure  around the affected sinuses. Other symptoms include: Upper toothache. Earache. Headache. Bad breath. Decreased sense of smell and taste. A cough, which worsens when you are lying flat. Fatigue. Fever. Thick drainage from your nose, which often is green and may contain pus (purulent). Swelling and warmth over the affected sinuses. DIAGNOSIS  Your caregiver will perform a  physical exam. During the exam, your caregiver may: Look in your nose for signs of abnormal growths in your nostrils (nasal polyps). Tap over the affected sinus to check for signs of infection. View the inside of your sinuses (endoscopy) with a special imaging device with a light attached (endoscope), which is inserted into your sinuses. If your caregiver suspects that you have chronic sinusitis, one or more of the following tests may be recommended: Allergy tests. Nasal culture A sample of mucus is taken from your nose and sent to a lab and screened for bacteria. Nasal cytology A sample of mucus is taken from your nose and examined by your caregiver to determine if your sinusitis is related to an allergy. TREATMENT  Most cases of acute sinusitis are related to a viral infection and will resolve on their own within 10 days. Sometimes medicines are prescribed to help relieve symptoms (pain medicine, decongestants, nasal steroid sprays, or saline sprays).  However, for sinusitis related to a bacterial infection, your caregiver will prescribe antibiotic medicines. These are medicines that will help kill the bacteria causing the infection.  Rarely, sinusitis is caused by a fungal infection. In theses cases, your caregiver will prescribe antifungal medicine. For some cases of chronic sinusitis, surgery is needed. Generally, these are cases in which sinusitis recurs more than 3 times per year, despite other treatments. HOME CARE INSTRUCTIONS  Drink plenty of water . Water  helps thin the mucus so your sinuses can drain more easily. Use a humidifier. Inhale steam 3 to 4 times a day (for example, sit in the bathroom with the shower running). Apply a warm, moist washcloth to your face 3 to 4 times a day, or as directed by your caregiver. Use saline nasal sprays to help moisten and clean your sinuses. Take over-the-counter or prescription medicines for pain, discomfort, or fever only as directed by your  caregiver. SEEK IMMEDIATE MEDICAL CARE IF: You have increasing pain or severe headaches. You have nausea, vomiting, or drowsiness. You have swelling around your face. You have vision problems. You have a stiff neck. You have difficulty breathing. MAKE SURE YOU:  Understand these instructions. Will watch your condition. Will get help right away if you are not doing well or get worse. Document Released: 09/15/2005 Document Revised: 12/08/2011 Document Reviewed: 09/30/2011 [facility] Patient Information 2014 [place], [place].    If you have been instructed to have an in-person evaluation today at a local Urgent Care facility, please use the link below. It will take you to a list of all of our available [facility], including address, phone number and hours of operation. Please do not delay care.  [facility]  If you or a family member do not have a primary care provider, use the link below to schedule a visit and establish care. When you choose a [facility] primary care physician or advanced practice provider, you gain a long-term partner in health. Find a Primary Care Provider  Learn more about [facility]'s in-office and virtual care options: [facility] - Get Care Now

## 2024-06-16 ENCOUNTER — Ambulatory Visit (INDEPENDENT_AMBULATORY_CARE_PROVIDER_SITE_OTHER)

## 2024-06-16 ENCOUNTER — Ambulatory Visit (INDEPENDENT_AMBULATORY_CARE_PROVIDER_SITE_OTHER): Admitting: Podiatry

## 2024-06-16 DIAGNOSIS — Z9889 Other specified postprocedural states: Secondary | ICD-10-CM

## 2024-06-16 DIAGNOSIS — M7661 Achilles tendinitis, right leg: Secondary | ICD-10-CM

## 2024-06-16 DIAGNOSIS — M9261 Juvenile osteochondrosis of tarsus, right ankle: Secondary | ICD-10-CM

## 2024-06-16 NOTE — Progress Notes (Signed)
 Subjective:  Patient ID: Brandy Ballard, female    DOB: 1965/10/28,  MRN: [id number]  Chief Complaint  Patient presents with   Routine Post Op    Post Op-DOS 04/25/24 RT ACHILLES TENDON REPAIR W/ RESECTION OF HAGLAUND W/ GASTROCNEMIUS RECESSION W/ FIXATION    DOS: 04/25/2024 Procedure: Right Achilles tendon repair with resection of Haglund's deformity with gastrocnemius recession  58 y.o. female returns for post-op check.  Patient states that she is doing well.  Denies any other acute complaints she states that she is weightbearing as tolerated in flip-flops.  Denies any other acute issues.  Review of Systems: Negative except as noted in the HPI. Denies N/V/F/Ch.  Past Medical History:  Diagnosis Date   Abdominal pain, epigastric    Achilles tendinitis, right leg    Allergy    Anemia    Arthritis    lower back   Benign neoplasm of cecum    Blood transfusion without reported diagnosis    Dental bridge present    permanent lower retainer   Gastric polyp    GERD (gastroesophageal reflux disease)    Hashimoto's disease    Heel spur    bilateral   Hyperlipidemia    Plantar fasciitis    bilateral   Polyp of nasal sinus    multiple   Polyp of sigmoid colon    PONV (postoperative nausea and vomiting)    nausea only   Sinus disease    Thyroid  disease    Ulcer     Current Outpatient Medications:    amoxicillin -clavulanate (AUGMENTIN ) 875-125 MG tablet, Take 1 tablet by mouth 2 (two) times daily., Disp: 14 tablet, Rfl: 0   melatonin 5 MG TABS, Take 5 mg by mouth at bedtime., Disp: , Rfl:    mometasone  (NASONEX ) 50 MCG/ACT nasal spray, Place 2 sprays into the nose daily., Disp: 17 g, Rfl: 12  Social History   Tobacco Use  Smoking Status Former   Current packs/day: 0.00   Average packs/day: 0.3 packs/day for 20.0 years (5.0 ttl pk-yrs)   Types: Cigarettes   Start date: 11/29/1998   Quit date: 11/29/2018   Years since quitting: 5.5  Smokeless Tobacco Never  Tobacco Comments    down to 2-4 daily    Allergies  Allergen Reactions   Nsaids     Avoids due to Hx Peptic ulcers   Clearasil Daily Clear Acne [Benzoyl Peroxide] Rash   Objective:  There were no vitals filed for this visit. There is no height or weight on file to calculate BMI. Constitutional Well developed. Well nourished.  Vascular Foot warm and well perfused. Capillary refill normal to all digits.   Neurologic Normal speech. Oriented to person, place, and time. Epicritic sensation to light touch grossly present bilaterally.  Dermatologic Skin completely epithelialized no signs of dehiscence noted no complication noted.  Good range of motion noted of the ankle joint.  Orthopedic: Mild tenderness to palpation noted about the surgical site.   Radiographs: None Assessment:   1. Achilles tendinitis, right leg     Plan:  Patient was evaluated and treated and all questions answered.  S/p foot surgery right -Progressing as expected post-operatively. -XR: See above -WB Status: Weightbearing as tolerated in either shoes -Sutures: Removed no clinical signs of dehiscence noted no complication noted. -Medications: None - Patient will start physical therapy as she is having some antalgic gait and weakness.  Patient is in agreement prescription for renewal physical therapy was given  No follow-ups on file.

## 2024-06-20 ENCOUNTER — Telehealth: Payer: Self-pay | Admitting: Podiatry

## 2024-06-20 NOTE — Telephone Encounter (Signed)
 lft mess on vmail for pt to adv 25.00 for form completion. adv her she can pay at [facility] or [place] office and I would fax and emai her a copy for her records.

## 2024-06-21 ENCOUNTER — Telehealth: Payer: Self-pay | Admitting: Podiatry

## 2024-06-21 DIAGNOSIS — Z0279 Encounter for issue of other medical certificate: Secondary | ICD-10-CM

## 2024-06-21 NOTE — Telephone Encounter (Signed)
 Faxed [phone] [employer] RTW approx 07/11/24 Emailed copy of what was faxed to email addr on file

## 2024-06-28 ENCOUNTER — Telehealth: Payer: Self-pay | Admitting: Podiatry

## 2024-06-28 NOTE — Telephone Encounter (Signed)
 pt lft mess again. I called and lft mess that I faxed and emailed her revision date RTW 07/25/24.

## 2024-06-28 NOTE — Telephone Encounter (Signed)
 Pt lft mess about RTW needed to be after 07/21/24. Faxed revised RTW 07/25/24 to [facility]  [phone] and emailed to adv her

## 2024-06-29 DIAGNOSIS — M25579 Pain in unspecified ankle and joints of unspecified foot: Secondary | ICD-10-CM | POA: Diagnosis not present

## 2024-07-08 ENCOUNTER — Other Ambulatory Visit: Payer: Self-pay

## 2024-07-08 ENCOUNTER — Telehealth: Admitting: Family Medicine

## 2024-07-08 DIAGNOSIS — N3000 Acute cystitis without hematuria: Secondary | ICD-10-CM

## 2024-07-08 MED ORDER — CEPHALEXIN 500 MG PO CAPS
500.0000 mg | ORAL_CAPSULE | Freq: Two times a day (BID) | ORAL | 0 refills | Status: AC
  Filled 2024-07-08: qty 14, 7d supply, fill #0

## 2024-07-08 NOTE — Progress Notes (Signed)
 Virtual Visit Consent   Brandy Ballard, you are scheduled for a virtual visit with a [facility] provider today. Just as with appointments in the office, your consent must be obtained to participate. Your consent will be active for this visit and any virtual visit you may have with one of our providers in the next 365 days. If you have a MyChart account, a copy of this consent can be sent to you electronically.  As this is a virtual visit, video technology does not allow for your provider to perform a traditional examination. This may limit your provider's ability to fully assess your condition. If your provider identifies any concerns that need to be evaluated in person or the need to arrange testing (such as labs, EKG, etc.), we will make arrangements to do so. Although advances in technology are sophisticated, we cannot ensure that it will always work on either your end or our end. If the connection with a video visit is poor, the visit may have to be switched to a telephone visit. With either a video or telephone visit, we are not always able to ensure that we have a secure connection.  By engaging in this virtual visit, you consent to the provision of healthcare and authorize for your insurance to be billed (if applicable) for the services provided during this visit. Depending on your insurance coverage, you may receive a charge related to this service.  I need to obtain your verbal consent now. Are you willing to proceed with your visit today? Brandy Ballard has provided verbal consent on 07/08/2024 for a virtual visit (video or telephone). Loa Lamp, FNP  Date: 07/08/2024 3:33 PM   Virtual Visit via Video Note   I, Loa Lamp, connected with  Brandy Ballard  ([id number], 1965/10/10) on 07/08/24 at  3:30 PM EDT by a video-enabled telemedicine application and verified that I am speaking with the correct person using two identifiers.  Location: Patient: Virtual Visit Location Patient: Home Provider:  Virtual Visit Location Provider: Home Office   I discussed the limitations of evaluation and management by telemedicine and the availability of in person appointments. The patient expressed understanding and agreed to proceed.    History of Present Illness: Brandy Ballard is a 58 y.o. who identifies as a female who was assigned female at birth, and is being seen today for burning and frequency on urination. No fever or severe abd pain. SABRA  HPI: HPI  Problems:  Patient Active Problem List   Diagnosis Date Noted   Aortic atherosclerosis 05/28/2022   Fatty liver 05/28/2022   Diverticulosis 05/28/2022   Hyperlipidemia 09/17/2021   Migraine without status migrainosus, not intractable 09/12/2021   Sinus disease 09/12/2021   History of Hashimoto thyroiditis 09/11/2021   Osteoarthritis of spine with radiculopathy, lumbar region 09/11/2021   Spinal stenosis of lumbar region 09/11/2021   Gastroesophageal reflux disease    Osteoarthritis 05/17/2013   Esophageal reflux 05/17/2013   Hypothyroidism 05/17/2013    Allergies:  Allergies  Allergen Reactions   Nsaids     Avoids due to Hx Peptic ulcers   Clearasil Daily Clear Acne [Benzoyl Peroxide] Rash   Medications:  Current Outpatient Medications:    cephALEXin (KEFLEX) 500 MG capsule, Take 1 capsule (500 mg total) by mouth 2 (two) times daily for 7 days., Disp: 14 capsule, Rfl: 0   amoxicillin -clavulanate (AUGMENTIN ) 875-125 MG tablet, Take 1 tablet by mouth 2 (two) times daily., Disp: 14 tablet, Rfl: 0   melatonin 5 MG TABS, Take  5 mg by mouth at bedtime., Disp: , Rfl:    mometasone  (NASONEX ) 50 MCG/ACT nasal spray, Place 2 sprays into the nose daily., Disp: 17 g, Rfl: 12  Observations/Objective: Patient is well-developed, well-nourished in no acute distress.  Resting comfortably  at home.  Head is normocephalic, atraumatic.  No labored breathing.  Speech is clear and coherent with logical content.  Patient is alert and oriented at  baseline.    Assessment and Plan: 1. Acute cystitis without hematuria (Primary)  Increase fluids, prevention discussed, UC if sx persist or worsen.   Follow Up Instructions: I discussed the assessment and treatment plan with the patient. The patient was provided an opportunity to ask questions and all were answered. The patient agreed with the plan and demonstrated an understanding of the instructions.  A copy of instructions were sent to the patient via MyChart unless otherwise noted below.     The patient was advised to call back or seek an in-person evaluation if the symptoms worsen or if the condition fails to improve as anticipated.    Brandy Blane, FNP

## 2024-07-08 NOTE — Patient Instructions (Signed)

## 2024-07-21 ENCOUNTER — Ambulatory Visit: Admitting: Podiatry

## 2024-07-21 ENCOUNTER — Encounter: Payer: Self-pay | Admitting: Podiatry

## 2024-07-21 DIAGNOSIS — M21961 Unspecified acquired deformity of right lower leg: Secondary | ICD-10-CM | POA: Diagnosis not present

## 2024-07-21 DIAGNOSIS — M21962 Unspecified acquired deformity of left lower leg: Secondary | ICD-10-CM | POA: Diagnosis not present

## 2024-07-21 DIAGNOSIS — M7661 Achilles tendinitis, right leg: Secondary | ICD-10-CM

## 2024-07-21 NOTE — Progress Notes (Signed)
 Subjective:  Patient ID: Brandy Ballard, female    DOB: 1966-02-14,  MRN: [id number]  Chief Complaint  Patient presents with   Routine Post Op    DOS 04/25/24 RT ACHILLES TENDON REPAIR W/ RESECTION OF HAGLAUND W/ GASTROCNEMIUS RECESSION W/ FIXATION    DOS: 04/25/2024 Procedure: Right Achilles tendon repair with resection of Haglund's deformity with gastrocnemius recession  58 y.o. female returns for post-op check.  Patient states that she is doing well.  Denies any other acute complaints she states that she is weightbearing as tolerated in flip-flops.  Denies any other acute issues.  Review of Systems: Negative except as noted in the HPI. Denies N/V/F/Ch.  Past Medical History:  Diagnosis Date   Abdominal pain, epigastric    Achilles tendinitis, right leg    Allergy    Anemia    Arthritis    lower back   Benign neoplasm of cecum    Blood transfusion without reported diagnosis    Dental bridge present    permanent lower retainer   Gastric polyp    GERD (gastroesophageal reflux disease)    Hashimoto's disease    Heel spur    bilateral   Hyperlipidemia    Plantar fasciitis    bilateral   Polyp of nasal sinus    multiple   Polyp of sigmoid colon    PONV (postoperative nausea and vomiting)    nausea only   Sinus disease    Thyroid  disease    Ulcer     Current Outpatient Medications:    amoxicillin -clavulanate (AUGMENTIN ) 875-125 MG tablet, Take 1 tablet by mouth 2 (two) times daily., Disp: 14 tablet, Rfl: 0   melatonin 5 MG TABS, Take 5 mg by mouth at bedtime., Disp: , Rfl:    mometasone  (NASONEX ) 50 MCG/ACT nasal spray, Place 2 sprays into the nose daily., Disp: 17 g, Rfl: 12  Social History   Tobacco Use  Smoking Status Former   Current packs/day: 0.00   Average packs/day: 0.3 packs/day for 20.0 years (5.0 ttl pk-yrs)   Types: Cigarettes   Start date: 11/29/1998   Quit date: 11/29/2018   Years since quitting: 5.6  Smokeless Tobacco Never  Tobacco Comments   down to  2-4 daily    Allergies  Allergen Reactions   Nsaids     Avoids due to Hx Peptic ulcers   Clearasil Daily Clear Acne [Benzoyl Peroxide] Rash   Objective:  There were no vitals filed for this visit. There is no height or weight on file to calculate BMI. Constitutional Well developed. Well nourished.  Vascular Foot warm and well perfused. Capillary refill normal to all digits.   Neurologic Normal speech. Oriented to person, place, and time. Epicritic sensation to light touch grossly present bilaterally.  Dermatologic Skin completely epithelialized no signs of dehiscence noted no complication noted.  Good range of motion noted of the ankle joint.  Orthopedic: Mild tenderness to palpation noted about the surgical site.   Radiographs: None Assessment:   1. Achilles tendinitis, right leg   2. Foot deformity, bilateral      Plan:  Patient was evaluated and treated and all questions answered.  S/p foot surgery right - Clinically healed and officially discharged from my care if any foot and ankle issues on future she will come back and see me.  I discussed shoe gear modification as well as orthotics management she states understanding like to proceed with orthotics.  -Pes cavus/foot deformity -I explained to patient the etiology of pes  cavus and relationship with heel pain/arch pain and various treatment options were discussed.  Given patient foot structure in the setting of heel pain/arch pain I believe patient will benefit from custom-made orthotics to help control the hindfoot motion support the arch of the foot and take the stress away from arches.  Patient agrees with the plan like to proceed with orthotics -Patient was casted for orthotics Quarter inch heel lift bilaterally   No follow-ups on file.

## 2024-07-25 ENCOUNTER — Telehealth: Payer: Self-pay | Admitting: Podiatry

## 2024-07-25 NOTE — Telephone Encounter (Signed)
 Recd form requesting notes. Faxed [employer] [phone] charge for form. Sent work note

## 2024-08-03 ENCOUNTER — Other Ambulatory Visit: Payer: Self-pay

## 2024-08-03 MED ORDER — FLUZONE 0.5 ML IM SUSY
0.5000 mL | PREFILLED_SYRINGE | Freq: Once | INTRAMUSCULAR | 0 refills | Status: AC
  Filled 2024-08-03: qty 0.5, 1d supply, fill #0

## 2024-08-29 ENCOUNTER — Encounter: Payer: Self-pay | Admitting: Podiatry

## 2024-09-06 ENCOUNTER — Ambulatory Visit: Admitting: Family Medicine

## 2024-09-14 ENCOUNTER — Encounter: Payer: Self-pay | Admitting: Family Medicine

## 2024-09-14 ENCOUNTER — Ambulatory Visit: Admitting: Family Medicine

## 2024-09-14 VITALS — BP 117/80 | HR 88 | Temp 98.1°F | Ht 65.5 in | Wt 227.0 lb

## 2024-09-14 DIAGNOSIS — Z1211 Encounter for screening for malignant neoplasm of colon: Secondary | ICD-10-CM | POA: Diagnosis not present

## 2024-09-14 DIAGNOSIS — R42 Dizziness and giddiness: Secondary | ICD-10-CM

## 2024-09-14 DIAGNOSIS — Z789 Other specified health status: Secondary | ICD-10-CM | POA: Diagnosis not present

## 2024-09-14 DIAGNOSIS — E010 Iodine-deficiency related diffuse (endemic) goiter: Secondary | ICD-10-CM | POA: Diagnosis not present

## 2024-09-14 DIAGNOSIS — Z8639 Personal history of other endocrine, nutritional and metabolic disease: Secondary | ICD-10-CM

## 2024-09-14 DIAGNOSIS — E069 Thyroiditis, unspecified: Secondary | ICD-10-CM | POA: Diagnosis not present

## 2024-09-14 LAB — BAYER DCA HB A1C WAIVED: HB A1C (BAYER DCA - WAIVED): 5.5 % (ref 4.8–5.6)

## 2024-09-14 NOTE — Progress Notes (Signed)
 BP 117/80   Pulse 88   Temp 98.1 F (36.7 C) (Oral)   Ht 5' 5.5 (1.664 m)   Wt 227 lb (103 kg)   SpO2 96%   BMI 37.20 kg/m    Subjective:    Patient ID: Brandy Ballard, female    DOB: [birth year], 58 y.o.   MRN: [id number]  HPI: Brandy Ballard is a 58 y.o. female  Chief Complaint  Patient presents with   Thyroid  Problem   Obesity   Dizziness    Last episode Thanksgiving    Back at work for about 4 hours a day after her surgery- easing back into it.   Has been having hives whenever she's not taking her antihistamine.   THYROIDITIS Thyroid  control status:uncontrolled Satisfied with current treatment? no Medication side effects: N/A Fatigue: yes Cold intolerance: yes Heat intolerance: yes Weight gain: yes Weight loss: no Constipation: no Diarrhea/loose stools: no Palpitations: no Lower extremity edema: no Anxiety/depressed mood: no  Has been having tinnitus all the time. Had eaten some additional salt- sat down to eat and was very dizzy and felt like she was going to fall out of her chair. It lasted about 5 seconded and it has not happened again.    Relevant past medical, surgical, family and social history reviewed and updated as indicated. Interim medical history since our last visit reviewed. Allergies and medications reviewed and updated.  Review of Systems  Constitutional:  Positive for fatigue and unexpected weight change. Negative for activity change, appetite change, chills, diaphoresis and fever.  Respiratory: Negative.    Cardiovascular: Negative.   Musculoskeletal:  Positive for myalgias. Negative for arthralgias, back pain, gait problem, joint swelling, neck pain and neck stiffness.  Neurological: Negative.   Psychiatric/Behavioral: Negative.      Per HPI unless specifically indicated above     Objective:    BP 117/80   Pulse 88   Temp 98.1 F (36.7 C) (Oral)   Ht 5' 5.5 (1.664 m)   Wt 227 lb (103 kg)   SpO2 96%   BMI 37.20 kg/m   Wt  Readings from Last 3 Encounters:  09/14/24 227 lb (103 kg)  02/17/24 205 lb 9.6 oz (93.3 kg)  06/26/23 204 lb (92.5 kg)    Physical Exam Vitals and nursing note reviewed.  Constitutional:      General: She is not in acute distress.    Appearance: Normal appearance. She is not ill-appearing, toxic-appearing or diaphoretic.  HENT:     Head: Normocephalic and atraumatic.     Right Ear: External ear normal.     Left Ear: External ear normal.     Nose: Nose normal.     Mouth/Throat:     Mouth: Mucous membranes are moist.     Pharynx: Oropharynx is clear.  Eyes:     General: No scleral icterus.       Right eye: No discharge.        Left eye: No discharge.     Extraocular Movements: Extraocular movements intact.     Left eye: Nystagmus present.     Conjunctiva/sclera: Conjunctivae normal.     Pupils: Pupils are equal, round, and reactive to light.  Cardiovascular:     Rate and Rhythm: Normal rate and regular rhythm.     Pulses: Normal pulses.     Heart sounds: Normal heart sounds. No murmur heard.    No friction rub. No gallop.  Pulmonary:     Effort: Pulmonary effort is normal.  No respiratory distress.     Breath sounds: Normal breath sounds. No stridor. No wheezing, rhonchi or rales.  Chest:     Chest wall: No tenderness.  Musculoskeletal:        General: Normal range of motion.     Cervical back: Normal range of motion and neck supple.  Skin:    General: Skin is warm and dry.     Capillary Refill: Capillary refill takes less than 2 seconds.     Coloration: Skin is not jaundiced or pale.     Findings: No bruising, erythema, lesion or rash.  Neurological:     General: No focal deficit present.     Mental Status: She is alert and oriented to person, place, and time. Mental status is at baseline.  Psychiatric:        Mood and Affect: Mood normal.        Behavior: Behavior normal.        Thought Content: Thought content normal.        Judgment: Judgment normal.      Results for orders placed or performed in visit on 09/14/24  Bayer DCA Hb A1c Waived   Collection Time: 09/14/24  8:58 AM  Result Value Ref Range   HB A1C (BAYER DCA - WAIVED) 5.5 4.8 - 5.6 %      Assessment & Plan:   Problem List Items Addressed This Visit       Other   History of Hashimoto thyroiditis - Primary   Was not able to get into see endocrinology. She feels like her nodules are enlarging and making it hard for her to swallow. She would like treatment and is quite frustrated. Will recheck labs today and refer to endocrinology at patient's request.       Relevant Orders   Ambulatory referral to Endocrinology   Other Visit Diagnoses       Vertigo       Will check labs and start Epley's manuvers. Call with any concerns.   Relevant Orders   Magnesium   Phosphorus   Comp Met (CMET)   CBC with Differential/Platelet   Bayer DCA Hb A1c Waived (Completed)     Hepatitis B vaccination status unknown       Labs drawn today. Await results.   Relevant Orders   Hepatitis B surface antibody,quantitative     Screening for colon cancer       Referral to GI placed today.   Relevant Orders   Ambulatory referral to Gastroenterology     Thyromegaly       Relevant Orders   Ambulatory referral to Endocrinology        Follow up plan: Return in about 2 months (around 11/15/2024).

## 2024-09-14 NOTE — Assessment & Plan Note (Signed)
 Was not able to get into see endocrinology. She feels like her nodules are enlarging and making it hard for her to swallow. She would like treatment and is quite frustrated. Will recheck labs today and refer to endocrinology at patient's request.

## 2024-09-15 ENCOUNTER — Ambulatory Visit: Payer: Self-pay | Admitting: Family Medicine

## 2024-09-15 LAB — COMPREHENSIVE METABOLIC PANEL WITH GFR
ALT: 22 IU/L (ref 0–32)
AST: 14 IU/L (ref 0–40)
Albumin: 4.3 g/dL (ref 3.8–4.9)
Alkaline Phosphatase: 81 IU/L (ref 49–135)
BUN/Creatinine Ratio: 27 — ABNORMAL HIGH (ref 9–23)
BUN: 17 mg/dL (ref 6–24)
Bilirubin Total: <0.2 mg/dL (ref 0.0–1.2)
CO2: 21 mmol/L (ref 20–29)
Calcium: 9.5 mg/dL (ref 8.7–10.2)
Chloride: 102 mmol/L (ref 96–106)
Creatinine, Ser: 0.64 mg/dL (ref 0.57–1.00)
Globulin, Total: 3.1 g/dL (ref 1.5–4.5)
Glucose: 111 mg/dL — ABNORMAL HIGH (ref 70–99)
Potassium: 4.5 mmol/L (ref 3.5–5.2)
Sodium: 141 mmol/L (ref 134–144)
Total Protein: 7.4 g/dL (ref 6.0–8.5)
eGFR: 104 mL/min/1.73 (ref >59)

## 2024-09-15 LAB — CBC WITH DIFFERENTIAL/PLATELET
Basophils Absolute: 0.1 x10E3/uL (ref 0.0–0.2)
Basos: 1 %
EOS (ABSOLUTE): 0.8 x10E3/uL — ABNORMAL HIGH (ref 0.0–0.4)
Eos: 9 %
Hematocrit: 46.4 % (ref 34.0–46.6)
Hemoglobin: 15.6 g/dL (ref 11.1–15.9)
Immature Grans (Abs): 0 x10E3/uL (ref 0.0–0.1)
Immature Granulocytes: 0 %
Lymphocytes Absolute: 1.9 x10E3/uL (ref 0.7–3.1)
Lymphs: 20 %
MCH: 31.6 pg (ref 26.6–33.0)
MCHC: 33.6 g/dL (ref 31.5–35.7)
MCV: 94 fL (ref 79–97)
Monocytes Absolute: 0.6 x10E3/uL (ref 0.1–0.9)
Monocytes: 7 %
Neutrophils Absolute: 5.8 x10E3/uL (ref 1.4–7.0)
Neutrophils: 63 %
Platelets: 407 x10E3/uL (ref 150–450)
RBC: 4.93 x10E6/uL (ref 3.77–5.28)
RDW: 11.9 % (ref 11.7–15.4)
WBC: 9.2 x10E3/uL (ref 3.4–10.8)

## 2024-09-15 LAB — HEPATITIS B SURFACE ANTIBODY, QUANTITATIVE: Hepatitis B Surf Ab Quant: 43.7 m[IU]/mL

## 2024-09-15 LAB — THYROID ANTIBODIES (THYROPEROXIDASE & THYROGLOBULIN)
Thyroglobulin Antibody: 1106 [IU]/mL — ABNORMAL HIGH (ref 0.0–0.9)
Thyroperoxidase Ab SerPl-aCnc: >600 [IU]/mL — ABNORMAL HIGH (ref 0–34)

## 2024-09-15 LAB — THYROID PANEL WITH TSH
Free Thyroxine Index: 1.3 (ref 1.2–4.9)
T3 Uptake Ratio: 22 % — ABNORMAL LOW (ref 24–39)
T4, Total: 5.8 ug/dL (ref 4.5–12.0)
TSH: 10.4 u[IU]/mL — ABNORMAL HIGH (ref 0.450–4.500)

## 2024-09-15 LAB — PHOSPHORUS: Phosphorus: 4.2 mg/dL (ref 3.0–4.3)

## 2024-09-15 LAB — MAGNESIUM: Magnesium: 1.8 mg/dL (ref 1.6–2.3)

## 2024-09-15 MED ORDER — LEVOTHYROXINE SODIUM 25 MCG PO TABS
25.0000 ug | ORAL_TABLET | Freq: Every day | ORAL | 0 refills | Status: AC
  Filled 2024-09-15: qty 90, 90d supply, fill #0

## 2024-09-27 ENCOUNTER — Encounter: Payer: Self-pay | Admitting: Podiatry

## 2024-10-19 ENCOUNTER — Telehealth: Payer: Self-pay | Admitting: Podiatry

## 2024-10-19 NOTE — Telephone Encounter (Signed)
 Patient called requesting a return to work note with no restrictions.

## 2024-10-20 NOTE — Telephone Encounter (Signed)
 lft mess for pt to adv what date she wants to RTW with no restrictions. I adv to let me know how she wants note via MyChart or email.

## 2024-10-21 ENCOUNTER — Encounter: Payer: Self-pay | Admitting: Podiatry

## 2024-10-21 ENCOUNTER — Telehealth: Payer: Self-pay | Admitting: Podiatry

## 2024-10-21 NOTE — Telephone Encounter (Signed)
 Pt left mess she wants RTW 10/22/23 with no restrictions and can send to email [email or web address]

## 2024-10-25 ENCOUNTER — Ambulatory Visit: Admitting: Family Medicine

## 2024-11-09 ENCOUNTER — Ambulatory Visit: Admitting: "Endocrinology

## 2024-11-17 ENCOUNTER — Ambulatory Visit: Admitting: Family Medicine
